# Patient Record
Sex: Male | Born: 1950
Health system: Southern US, Community
[De-identification: ages and names within clinical notes are randomized; demographics above are authoritative.]

## PROBLEM LIST (undated history)

## (undated) DIAGNOSIS — Z9889 Other specified postprocedural states: Secondary | ICD-10-CM

## (undated) DIAGNOSIS — B019 Varicella without complication: Secondary | ICD-10-CM

## (undated) DIAGNOSIS — B059 Measles without complication: Secondary | ICD-10-CM

## (undated) DIAGNOSIS — B269 Mumps without complication: Secondary | ICD-10-CM

## (undated) DIAGNOSIS — Z8489 Family history of other specified conditions: Secondary | ICD-10-CM

## (undated) DIAGNOSIS — F209 Schizophrenia, unspecified: Secondary | ICD-10-CM

## (undated) DIAGNOSIS — R42 Dizziness and giddiness: Secondary | ICD-10-CM

## (undated) DIAGNOSIS — R112 Nausea with vomiting, unspecified: Secondary | ICD-10-CM

## (undated) HISTORY — DX: Measles without complication: B05.9

## (undated) HISTORY — PX: COLON SURGERY: SHX602

## (undated) HISTORY — PX: OTHER SURGICAL HISTORY: SHX169

## (undated) HISTORY — DX: Mumps without complication: B26.9

## (undated) HISTORY — DX: Varicella without complication: B01.9

## (undated) HISTORY — PX: ABDOMINAL SURGERY: SHX537

## (undated) HISTORY — DX: Dizziness and giddiness: R42

---

## 2009-12-01 ENCOUNTER — Ambulatory Visit: Payer: Self-pay | Admitting: Unknown Physician Specialty

## 2009-12-01 DIAGNOSIS — Z860101 Personal history of adenomatous and serrated colon polyps: Secondary | ICD-10-CM | POA: Insufficient documentation

## 2009-12-01 LAB — HM COLONOSCOPY

## 2011-08-13 LAB — LIPID PANEL
Cholesterol: 211 mg/dL — AB (ref 0–200)
HDL: 35 mg/dL (ref 35–70)
LDL Cholesterol: 145 mg/dL
Triglycerides: 155 mg/dL (ref 40–160)

## 2011-08-13 LAB — BASIC METABOLIC PANEL WITH GFR
BUN: 22 mg/dL — AB (ref 4–21)
Creatinine: 1.4 mg/dL — AB (ref 0.6–1.3)
Glucose: 89 mg/dL
Potassium: 4.8 mmol/L (ref 3.4–5.3)
Sodium: 140 mmol/L (ref 137–147)

## 2011-08-13 LAB — TSH: TSH: 2.01 u[IU]/mL (ref 0.41–5.90)

## 2011-08-29 ENCOUNTER — Emergency Department: Payer: Self-pay | Admitting: *Deleted

## 2012-02-21 LAB — LIPID PANEL
Cholesterol: 171 mg/dL (ref 0–200)
HDL: 36 mg/dL (ref 35–70)
LDL Cholesterol: 108 mg/dL
Triglycerides: 133 mg/dL (ref 40–160)

## 2012-02-21 LAB — BASIC METABOLIC PANEL WITH GFR
BUN: 21 mg/dL (ref 4–21)
Creatinine: 1.5 mg/dL — AB (ref 0.6–1.3)
Glucose: 93 mg/dL
Potassium: 4.7 mmol/L (ref 3.4–5.3)
Sodium: 139 mmol/L (ref 137–147)

## 2014-01-23 DIAGNOSIS — F2089 Other schizophrenia: Secondary | ICD-10-CM | POA: Diagnosis not present

## 2014-03-20 DIAGNOSIS — F209 Schizophrenia, unspecified: Secondary | ICD-10-CM | POA: Diagnosis not present

## 2014-06-18 DIAGNOSIS — F209 Schizophrenia, unspecified: Secondary | ICD-10-CM | POA: Diagnosis not present

## 2014-08-09 ENCOUNTER — Emergency Department: Payer: Self-pay | Admitting: Student

## 2014-08-09 DIAGNOSIS — S62639A Displaced fracture of distal phalanx of unspecified finger, initial encounter for closed fracture: Secondary | ICD-10-CM | POA: Diagnosis not present

## 2014-08-09 DIAGNOSIS — Z23 Encounter for immunization: Secondary | ICD-10-CM | POA: Diagnosis not present

## 2014-08-09 DIAGNOSIS — S62639B Displaced fracture of distal phalanx of unspecified finger, initial encounter for open fracture: Secondary | ICD-10-CM | POA: Diagnosis not present

## 2014-08-09 DIAGNOSIS — S61209A Unspecified open wound of unspecified finger without damage to nail, initial encounter: Secondary | ICD-10-CM | POA: Diagnosis not present

## 2014-08-09 DIAGNOSIS — S6980XA Other specified injuries of unspecified wrist, hand and finger(s), initial encounter: Secondary | ICD-10-CM | POA: Diagnosis not present

## 2014-08-09 DIAGNOSIS — I10 Essential (primary) hypertension: Secondary | ICD-10-CM | POA: Diagnosis not present

## 2014-08-09 DIAGNOSIS — F17200 Nicotine dependence, unspecified, uncomplicated: Secondary | ICD-10-CM | POA: Diagnosis not present

## 2014-08-09 DIAGNOSIS — S6990XA Unspecified injury of unspecified wrist, hand and finger(s), initial encounter: Secondary | ICD-10-CM | POA: Diagnosis not present

## 2014-08-09 DIAGNOSIS — S62609B Fracture of unspecified phalanx of unspecified finger, initial encounter for open fracture: Secondary | ICD-10-CM | POA: Diagnosis not present

## 2014-08-12 DIAGNOSIS — S62639B Displaced fracture of distal phalanx of unspecified finger, initial encounter for open fracture: Secondary | ICD-10-CM | POA: Diagnosis not present

## 2014-08-20 DIAGNOSIS — F209 Schizophrenia, unspecified: Secondary | ICD-10-CM | POA: Diagnosis not present

## 2014-09-03 DIAGNOSIS — M722 Plantar fascial fibromatosis: Secondary | ICD-10-CM | POA: Diagnosis not present

## 2014-09-03 DIAGNOSIS — M79609 Pain in unspecified limb: Secondary | ICD-10-CM | POA: Diagnosis not present

## 2014-09-03 DIAGNOSIS — M7730 Calcaneal spur, unspecified foot: Secondary | ICD-10-CM | POA: Diagnosis not present

## 2014-09-05 DIAGNOSIS — IMO0001 Reserved for inherently not codable concepts without codable children: Secondary | ICD-10-CM | POA: Diagnosis not present

## 2014-09-18 DIAGNOSIS — Z79899 Other long term (current) drug therapy: Secondary | ICD-10-CM | POA: Diagnosis not present

## 2014-09-18 DIAGNOSIS — F209 Schizophrenia, unspecified: Secondary | ICD-10-CM | POA: Diagnosis not present

## 2014-10-01 DIAGNOSIS — F200 Paranoid schizophrenia: Secondary | ICD-10-CM | POA: Diagnosis not present

## 2014-11-01 DIAGNOSIS — F200 Paranoid schizophrenia: Secondary | ICD-10-CM | POA: Diagnosis not present

## 2015-01-30 DIAGNOSIS — F200 Paranoid schizophrenia: Secondary | ICD-10-CM | POA: Diagnosis not present

## 2015-02-20 DIAGNOSIS — Z79899 Other long term (current) drug therapy: Secondary | ICD-10-CM | POA: Diagnosis not present

## 2015-04-24 DIAGNOSIS — F200 Paranoid schizophrenia: Secondary | ICD-10-CM | POA: Diagnosis not present

## 2015-05-14 ENCOUNTER — Telehealth: Payer: Self-pay | Admitting: Family Medicine

## 2015-05-14 NOTE — Telephone Encounter (Signed)
Scheduled Reestablish appt for Friday 05/16/15. Thanks tnp

## 2015-05-14 NOTE — Telephone Encounter (Addendum)
Pt sister, Osborne Casco called stating pt is feeling dizzy and falling.  Pt has not been seen since 02/17/2012.  Pt has been seeing Dr Jimmye Norman his psychiatrist for the past 3 years.  Rx-Olanzapine 15mg , Paroxetine 10mg , Propranolol 10mg  and 20mg , Risperidone 4mg  and Clonazepam 1mg .  Will you reestablish this pt? [phone]/[id number]

## 2015-05-14 NOTE — Telephone Encounter (Signed)
OK to re-establish 

## 2015-05-15 ENCOUNTER — Encounter: Payer: Self-pay | Admitting: *Deleted

## 2015-05-15 ENCOUNTER — Telehealth: Payer: Self-pay | Admitting: Family Medicine

## 2015-05-15 DIAGNOSIS — R609 Edema, unspecified: Secondary | ICD-10-CM | POA: Insufficient documentation

## 2015-05-15 DIAGNOSIS — I872 Venous insufficiency (chronic) (peripheral): Secondary | ICD-10-CM | POA: Insufficient documentation

## 2015-05-16 ENCOUNTER — Ambulatory Visit (INDEPENDENT_AMBULATORY_CARE_PROVIDER_SITE_OTHER): Payer: Medicare Other | Admitting: Family Medicine

## 2015-05-16 ENCOUNTER — Encounter: Payer: Self-pay | Admitting: Family Medicine

## 2015-05-16 ENCOUNTER — Other Ambulatory Visit: Payer: Self-pay | Admitting: Psychiatry

## 2015-05-16 VITALS — BP 90/60 | HR 72 | Temp 97.5°F | Resp 18 | Ht 71.0 in | Wt 241.0 lb

## 2015-05-16 DIAGNOSIS — F419 Anxiety disorder, unspecified: Secondary | ICD-10-CM

## 2015-05-16 DIAGNOSIS — R42 Dizziness and giddiness: Secondary | ICD-10-CM

## 2015-05-16 DIAGNOSIS — Z23 Encounter for immunization: Secondary | ICD-10-CM | POA: Diagnosis not present

## 2015-05-16 DIAGNOSIS — Z1389 Encounter for screening for other disorder: Secondary | ICD-10-CM

## 2015-05-16 DIAGNOSIS — H6123 Impacted cerumen, bilateral: Secondary | ICD-10-CM

## 2015-05-16 DIAGNOSIS — F209 Schizophrenia, unspecified: Secondary | ICD-10-CM

## 2015-05-16 NOTE — Progress Notes (Signed)
   Patient: [name] Male    DOB: Sep 30, 1951   64 y.o.   MRN: [id number] Visit Date: 05/16/2015  Today's Provider: Lelon Huh, MD   Chief Complaint  Patient presents with  . Establish Care   Subjective:    Dizziness This is a new problem. The current episode started 1 to 4 weeks ago (x3 weeks). The problem occurs daily. The problem has been unchanged. Associated symptoms include fatigue, nausea and a visual change. Pertinent negatives include no abdominal pain, chest pain, coughing, headaches, joint swelling, neck pain or vomiting. The symptoms are aggravated by bending. He has tried nothing for the symptoms. The treatment provided no relief.     Previous Medications   OLANZAPINE (ZYPREXA) 15 MG TABLET    Take 15 mg by mouth at bedtime.   PAROXETINE (PAXIL) 30 MG TABLET    Take 30 mg by mouth daily.   PROPRANOLOL (INDERAL) 10 MG TABLET    Take 10 mg by mouth 3 (three) times daily.    Review of Systems  Constitutional: Positive for fatigue.  HENT: Positive for hearing loss. Negative for ear pain.   Eyes: Positive for visual disturbance. Negative for pain.  Respiratory: Positive for shortness of breath. Negative for cough, chest tightness and wheezing.   Cardiovascular: Negative for chest pain, palpitations and leg swelling.  Gastrointestinal: Positive for nausea and constipation. Negative for vomiting, abdominal pain and diarrhea.  Genitourinary: Negative for urgency and frequency.  Musculoskeletal: Negative for back pain, joint swelling and neck pain.  Neurological: Positive for dizziness and light-headedness. Negative for headaches.     History  Substance Use Topics  . Smoking status: Former Research scientist (life sciences)  . Smokeless tobacco: Not on file  . Alcohol Use: 0.0 oz/week    0 Standard drinks or equivalent per week   Objective:    Filed Vitals:   05/16/15 0923  BP: 90/60  Pulse: 72  Temp: 97.5 F (36.4 C)  TempSrc: Oral  Resp: 18  Height: 5\' 11"  (1.803 m)  Weight: 241  lb (109.317 kg)    Physical Exam  Constitutional: He is oriented to person, place, and time. He appears well-developed and well-nourished.  Obese  HENT:  Head: Normocephalic.  Cerumen impaction both ears  Cardiovascular: Normal rate.   Neurological: He is alert and oriented to person, place, and time. Coordination normal.        Assessment & Plan:     1. Dizziness Seems to be due to propranolol as he states this has nearly resolved since his psychiatrist stopped medication. Call if sx return. Will send for copy of most recently labs his psychiatrist.  - EKG 12-Lead  2. Acute anxiety Stable. Continue routine psych follow up.  - EKG 12-Lead  3. Need for pneumococcal vaccination  - Pneumococcal conjugate vaccine 13-valent IM  4. Cerumen impaction Ceruminosis is noted.  Wax is removed by syringing and manual debridement. Instructions for home care to prevent wax buildup are given.

## 2015-05-23 NOTE — Telephone Encounter (Signed)
Pt was called and notified that rx was sent.

## 2015-06-13 ENCOUNTER — Other Ambulatory Visit: Payer: Self-pay | Admitting: Psychiatry

## 2015-06-13 MED ORDER — PROPRANOLOL HCL 10 MG PO TABS: 10.0000 mg | ORAL_TABLET | Freq: Two times a day (BID) | ORAL | Status: DC

## 2015-06-13 NOTE — Telephone Encounter (Signed)
C request from the pharmacy for Inderal 20 mg twice a day. However poor plan at the last appointment on 04/24/2015 the patient was to decrease to 10 mg twice a day. I have ordered this  Dose.

## 2015-08-05 ENCOUNTER — Encounter: Payer: Self-pay | Admitting: Psychiatry

## 2015-08-05 ENCOUNTER — Ambulatory Visit (INDEPENDENT_AMBULATORY_CARE_PROVIDER_SITE_OTHER): Payer: Medicare Other | Admitting: Psychiatry

## 2015-08-05 VITALS — BP 110/76 | HR 65 | Temp 97.1°F | Ht 72.0 in | Wt 241.2 lb

## 2015-08-05 DIAGNOSIS — F200 Paranoid schizophrenia: Secondary | ICD-10-CM

## 2015-08-05 MED ORDER — RISPERIDONE 4 MG PO TABS: 4.0000 mg | ORAL_TABLET | Freq: Every day | ORAL | Status: DC

## 2015-08-05 MED ORDER — CLONAZEPAM 1 MG PO TABS: 1.0000 mg | ORAL_TABLET | Freq: Every day | ORAL | Status: DC

## 2015-08-05 MED ORDER — PROPRANOLOL HCL 10 MG PO TABS: 10.0000 mg | ORAL_TABLET | Freq: Every day | ORAL | Status: DC

## 2015-08-05 MED ORDER — PAROXETINE HCL 30 MG PO TABS: 15.0000 mg | ORAL_TABLET | Freq: Every day | ORAL | Status: DC

## 2015-08-05 MED ORDER — OLANZAPINE 15 MG PO TABS: 30.0000 mg | ORAL_TABLET | Freq: Every day | ORAL | Status: DC

## 2015-08-05 NOTE — Progress Notes (Signed)
BH MD/PA/NP OP Progress Note  08/05/2015 11:29 AM [name]  MRN:  [id number]  Subjective:  Patient resents follow-up of his schizophrenia. He presents with his sister who was presented to all of his appointments. They indicate he continues to do well his mood is been good. The big issues now are that his sister would like to get him active in a program where he participates in activities. Patient does walk his dog. When there were last here in May to discuss some dizziness that appears to occur mainly in the morning and then go away. I did discuss that the propranolol could be controlling factor as well as the fact that he takes all of his psychiatric medications at bedtime and might be having some orthostasis. However both patient and sister feel he is doing really well. The sister has changed his dose of propranolol to just 20 mg at night. She indicates that the dizziness got a little better with this change. We have decided we'll go down to 10 mg at bedtime. Chief Complaint:  I'm feeling like myself Visit Diagnosis:  No diagnosis found.  Past Medical History:  Past Medical History  Diagnosis Date  . Chicken pox   . Measles   . Mumps   . Dizziness     Past Surgical History  Procedure Laterality Date  . None     Family History:  Family History  Problem Relation Age of Onset  . Diabetes Mother   . Heart disease Mother   . Stroke Mother   . Diabetes Father   . Hypertension Father   . Lung cancer Father   . Cancer Father     lung cancer  . Schizophrenia Sister   . Lung cancer Sister   . Colon cancer Sister    Social History:  Social History   Social History  . Marital Status: Single    Spouse Name: N/A  . Number of Children: 2  . Years of Education: N/A   Occupational History  . Disabled    Social History Main Topics  . Smoking status: Former Smoker    Types: Cigarettes    Quit date: 08/04/2004  . Smokeless tobacco: Former Systems developer    Quit date: 08/04/2004  .  Alcohol Use: No  . Drug Use: No  . Sexual Activity: Not Currently   Other Topics Concern  . None   Social History Narrative   Additional History:   Assessment:   Musculoskeletal: Strength & Muscle Tone: within normal limits Gait & Station: normal Patient leans: N/A  Psychiatric Specialty Exam: HPI  Review of Systems  Psychiatric/Behavioral: Negative for depression, suicidal ideas, hallucinations, memory loss and substance abuse. The patient is not nervous/anxious and does not have insomnia.     Blood pressure 110/76, pulse 65, temperature 97.1 F (36.2 C), temperature source Tympanic, height 6' (1.829 m), weight 241 lb 3.2 oz (109.408 kg), SpO2 95 %.Body mass index is 32.71 kg/(m^2).  General Appearance: Neat and Well Groomed  Eye Contact:  Good  Speech:  Normal Rate  Volume:  Normal  Mood:  I'm feeling like myself  Affect:  Appropriate and Congruent  Thought Process:  Linear and at times concrete  Orientation:  Full (Time, Place, and Person)  Thought Content:  Negative  Suicidal Thoughts:  No  Homicidal Thoughts:  No  Memory:  Immediate;   Good Recent;   Good Remote;   Good  Judgement:  Good  Insight:  Good  Psychomotor Activity:  Negative  Concentration:  Good  Recall:  Good  Fund of Knowledge: Fair  Language: Fair  Akathisia:  Negative  Handed:  Right unknown   AIMS (if indicated):  Done today, normal  Assets:  Desire for Improvement Social Support  ADL's:  Intact  Cognition: WNL  Sleep:  good   Is the patient at risk to self?  No. Has the patient been a risk to self in the past 6 months?  No. Has the patient been a risk to self within the distant past?  No. Is the patient a risk to others?  No. Has the patient been a risk to others in the past 6 months?  No. Has the patient been a risk to others within the distant past?  No.  Current Medications: Current Outpatient Prescriptions  Medication Sig Dispense Refill  . clonazePAM (KLONOPIN) 1 MG tablet  Take 1 tablet (1 mg total) by mouth at bedtime. 30 tablet 3  . OLANZapine (ZYPREXA) 15 MG tablet Take 2 tablets (30 mg total) by mouth at bedtime. 60 tablet 3  . PARoxetine (PAXIL) 30 MG tablet Take 0.5 tablets (15 mg total) by mouth daily. 15 tablet 3  . propranolol (INDERAL) 20 MG tablet TAKE ONE TABLET IN THE EVENING AND ONE AT BEDTIME 60 tablet 0  . risperidone (RISPERDAL) 4 MG tablet 1 tablet daily.    . propranolol (INDERAL) 10 MG tablet Take 10 mg by mouth 3 (three) times daily.    . propranolol (INDERAL) 10 MG tablet Take 1 tablet (10 mg total) by mouth at bedtime. 30 tablet 3   No current facility-administered medications for this visit.    Medical Decision Making:  Established Problem, Stable/Improving (1), Review of Medication Regimen & Side Effects (2) and Review of New Medication or Change in Dosage (2)  Treatment Plan Summary:Medication management and Plan Spent some time discussing with them that the patient is on several medications at night. I did inform both patient and sister about the olanzapine dose being 30 mg however both patient and sister feel this current dosage has been helpful. Given weighing patient's standing stability and desire to continue on this dose we'll continue with it. This has been the case for my discussion with them on being on 2 antipsychotic medications and they raise similar desire to continue with the current regimen. This might be reasonable given his long-standing stability on this regimen. Thus we'll continue his clonazepam 1 mg at bedtime, his olanzapine 30 mg at bedtime his paroxetine 15 mg at bedtime and his risperidone 4 mg at bedtime. We will decrease his propranolol from the 20 Milligan's at bedtime to 10 mg at bedtime to try to address his complaints of some morning dizziness.   Faith Rogue 08/05/2015, 11:29 AM

## 2015-11-03 ENCOUNTER — Ambulatory Visit (INDEPENDENT_AMBULATORY_CARE_PROVIDER_SITE_OTHER): Payer: Medicare PPO | Admitting: Psychiatry

## 2015-11-03 ENCOUNTER — Encounter: Payer: Self-pay | Admitting: Psychiatry

## 2015-11-03 VITALS — BP 126/62 | HR 79 | Temp 97.4°F | Ht 72.0 in | Wt 247.0 lb

## 2015-11-03 DIAGNOSIS — F200 Paranoid schizophrenia: Secondary | ICD-10-CM | POA: Diagnosis not present

## 2015-11-03 MED ORDER — PAROXETINE HCL 30 MG PO TABS: 15.0000 mg | ORAL_TABLET | Freq: Every day | ORAL | Status: DC

## 2015-11-03 MED ORDER — CLONAZEPAM 1 MG PO TABS: 1.0000 mg | ORAL_TABLET | Freq: Every day | ORAL | Status: DC

## 2015-11-03 MED ORDER — RISPERIDONE 4 MG PO TABS: 4.0000 mg | ORAL_TABLET | Freq: Every day | ORAL | Status: DC

## 2015-11-03 MED ORDER — PROPRANOLOL HCL 10 MG PO TABS: 10.0000 mg | ORAL_TABLET | ORAL | Status: DC

## 2015-11-03 MED ORDER — OLANZAPINE 15 MG PO TABS: 30.0000 mg | ORAL_TABLET | Freq: Every day | ORAL | Status: DC

## 2015-11-03 NOTE — Progress Notes (Signed)
BH MD/PA/NP OP Progress Note  11/03/2015 10:59 AM [name]  MRN:  [id number]  Subjective:  Patient resents follow-up of his schizophrenia. He presents with his sister as he has for all those appointments. They indicate that there is been no psychiatric problems. Patient does state that he drools. Sr. comments that is because he told his mouth open. I did discuss with them as I have in the past that it could be ineffective him being on 2 antipsychotic medications. Both patient and sister want to stay on the same regimen as he has decompensated in the past when there have been changes to it.  In case he sleeping well and eating well. I did discuss with him again any issues with dizziness as we have been decreasing his propranolol. At the last visit we decreased his dose to 10 mg at bedtime. However the sister indicates she is continue to give him 10 mg twice a day. She indicates he still does have issues with dizziness and thus I recommended taking a 10 mg dose once a day. However we decided that perhaps it's better to take the dose in the morning because it any benefits for anxiety by taking it when he is sleeping. Chief Complaint: Drooling Chief Complaint    Follow-up; Medication Refill; Other     I'm feeling like myself Visit Diagnosis:     ICD-9-CM ICD-10-CM   1. Paranoid schizophrenia ([place]) 295.30 F20.0     Past Medical History:  Past Medical History  Diagnosis Date  . Chicken pox   . Measles   . Mumps   . Dizziness     Past Surgical History  Procedure Laterality Date  . None     Family History:  Family History  Problem Relation Age of Onset  . Diabetes Mother   . Heart disease Mother   . Stroke Mother   . Diabetes Father   . Hypertension Father   . Lung cancer Father   . Cancer Father     lung cancer  . Schizophrenia Sister   . Lung cancer Sister   . Colon cancer Sister    Social History:  Social History   Social History  . Marital Status: Single    Spouse  Name: N/A  . Number of Children: 2  . Years of Education: N/A   Occupational History  . Disabled    Social History Main Topics  . Smoking status: Former Smoker    Types: Cigarettes    Quit date: 08/04/2004  . Smokeless tobacco: Former Systems developer    Quit date: 08/04/2004  . Alcohol Use: No  . Drug Use: No  . Sexual Activity: Not Currently   Other Topics Concern  . None   Social History Narrative   Additional History:   Assessment:   Musculoskeletal: Strength & Muscle Tone: within normal limits Gait & Station: normal Patient leans: N/A  Psychiatric Specialty Exam: HPI  Review of Systems  Psychiatric/Behavioral: Negative for depression, suicidal ideas, hallucinations, memory loss and substance abuse. The patient is not nervous/anxious and does not have insomnia.   All other systems reviewed and are negative.   Blood pressure 126/62, pulse 79, temperature 97.4 F (36.3 C), temperature source Tympanic, height 6' (1.829 m), weight 247 lb (112.038 kg), SpO2 95 %.Body mass index is 33.49 kg/(m^2).  General Appearance: Neat and Well Groomed  Eye Contact:  Good  Speech:  Normal Rate  Volume:  Normal  Mood:  I'm feeling like myself  Affect:  Appropriate and Congruent  Thought Process:  Linear and at times concrete  Orientation:  Full (Time, Place, and Person)  Thought Content:  Negative  Suicidal Thoughts:  No  Homicidal Thoughts:  No  Memory:  Immediate;   Good Recent;   Good Remote;   Good  Judgement:  Good  Insight:  Good  Psychomotor Activity:  Negative  Concentration:  Good  Recall:  Good  Fund of Knowledge: Fair  Language: Fair  Akathisia:  Negative  Handed:  Right unknown   AIMS (if indicated):  Done 11/03/15 normal  Assets:  Desire for Improvement Social Support  ADL's:  Intact  Cognition: WNL  Sleep:  good   Is the patient at risk to self?  No. Has the patient been a risk to self in the past 6 months?  No. Has the patient been a risk to self within the  distant past?  No. Is the patient a risk to others?  No. Has the patient been a risk to others in the past 6 months?  No. Has the patient been a risk to others within the distant past?  No.  Current Medications: Current Outpatient Prescriptions  Medication Sig Dispense Refill  . clonazePAM (KLONOPIN) 1 MG tablet Take 1 tablet (1 mg total) by mouth at bedtime. 30 tablet 3  . OLANZapine (ZYPREXA) 15 MG tablet Take 2 tablets (30 mg total) by mouth at bedtime. 60 tablet 3  . PARoxetine (PAXIL) 30 MG tablet Take 0.5 tablets (15 mg total) by mouth daily. 15 tablet 3  . propranolol (INDERAL) 10 MG tablet Take 1 tablet (10 mg total) by mouth every morning. 30 tablet 3  . risperidone (RISPERDAL) 4 MG tablet Take 1 tablet (4 mg total) by mouth at bedtime. 30 tablet 3   No current facility-administered medications for this visit.    Medical Decision Making:  Established Problem, Stable/Improving (1), Review of Medication Regimen & Side Effects (2) and Review of New Medication or Change in Dosage (2)  Treatment Plan Summary:Medication management and Plan   Schizophrenia-stable. We will continue his olanzapine 30 mg daily, his risperidone 4 mg, at bedtime, paroxetine 15 mg daily, clonazepam 1 mg at bedtime and propranolol 10 mg in the morning.  Patient will follow up in 3 months. They've been encouraged to call with any questions or concerns prior to the next appointment.   Faith Rogue 11/03/2015, 10:59 AM

## 2016-02-03 ENCOUNTER — Encounter: Payer: Self-pay | Admitting: Psychiatry

## 2016-02-03 ENCOUNTER — Ambulatory Visit (INDEPENDENT_AMBULATORY_CARE_PROVIDER_SITE_OTHER): Payer: Medicare Other | Admitting: Psychiatry

## 2016-02-03 ENCOUNTER — Ambulatory Visit: Payer: Medicare Other | Admitting: Psychiatry

## 2016-02-03 DIAGNOSIS — F209 Schizophrenia, unspecified: Secondary | ICD-10-CM

## 2016-02-03 MED ORDER — PAROXETINE HCL 30 MG PO TABS: 15.0000 mg | ORAL_TABLET | Freq: Every day | ORAL | Status: DC

## 2016-02-03 MED ORDER — OLANZAPINE 15 MG PO TABS: 30.0000 mg | ORAL_TABLET | Freq: Every day | ORAL | Status: DC

## 2016-02-03 MED ORDER — RISPERIDONE 4 MG PO TABS: 4.0000 mg | ORAL_TABLET | Freq: Every day | ORAL | Status: DC

## 2016-02-03 MED ORDER — CLONAZEPAM 1 MG PO TABS: 1.0000 mg | ORAL_TABLET | Freq: Every day | ORAL | Status: DC

## 2016-02-03 NOTE — Progress Notes (Signed)
Patient ID: [name], male   DOB: Nov 18, 1951, 65 y.o.   MRN: [id number] [facility] MD/PA/NP OP Progress Note  02/03/2016 10:33 AM [name]  MRN:  [id number]  Subjective:  Patient is a 65 year old white male with a history of schizophrenia. He was previously seen by Dr. Jimmye Norman. Patient is presents today with his sister for a follow-up appointment. Both patient and his sister report that he has been doing quite well and denies any symptoms. Sister reports that they have stopped the propranolol. Discussed with them the need for 2 antipsychotics such as Zyprexa and Risperdal. His sister reports that the previously been to had tried to tweak his medications it had made the patient violent and caused him to be hospitalized. She states that they're not willing to change any medication at this time. States that this current combination keeps him of functioning well. He stays home mostly but he is able to do chores and enjoys life. He sleeps quite well. Denies hearing voices or seeing things. Denies any suicidal ideations.  Per Dr.williams note,"  I did discuss with them as I have in the past that it could be ineffective him being on 2 antipsychotic medications. Both patient and sister want to stay on the same regimen as he has decompensated in the past when there have been changes to it."   Chief Complaint:   Doing well Visit Diagnosis:   No diagnosis found.  Past Medical History:  Past Medical History  Diagnosis Date  . Chicken pox   . Measles   . Mumps   . Dizziness     Past Surgical History  Procedure Laterality Date  . None     Family History:  Family History  Problem Relation Age of Onset  . Diabetes Mother   . Heart disease Mother   . Stroke Mother   . Diabetes Father   . Hypertension Father   . Lung cancer Father   . Cancer Father     lung cancer  . Schizophrenia Sister   . Lung cancer Sister   . Colon cancer Sister    Social History:  Social History   Social  History  . Marital Status: Single    Spouse Name: N/A  . Number of Children: 2  . Years of Education: N/A   Occupational History  . Disabled    Social History Main Topics  . Smoking status: Former Smoker    Types: Cigarettes    Quit date: 08/04/2004  . Smokeless tobacco: Former Systems developer    Quit date: 08/04/2004  . Alcohol Use: No  . Drug Use: No  . Sexual Activity: Not Currently   Other Topics Concern  . Not on file   Social History Narrative   Additional History:   Assessment:   Musculoskeletal: Strength & Muscle Tone: within normal limits Gait & Station: normal Patient leans: N/A  Psychiatric Specialty Exam: HPI  Review of Systems  Psychiatric/Behavioral: Negative for depression, suicidal ideas, hallucinations, memory loss and substance abuse. The patient is not nervous/anxious and does not have insomnia.   All other systems reviewed and are negative.   There were no vitals taken for this visit.There is no weight on file to calculate BMI.  General Appearance: Neat and Well Groomed  Eye Contact:  Good  Speech:  Normal Rate  Volume:  Normal  Mood:  I'm feeling like myself  Affect:  Appropriate and Congruent  Thought Process:  Linear and at times concrete  Orientation:  Full (  Time, Place, and Person)  Thought Content:  Negative  Suicidal Thoughts:  No  Homicidal Thoughts:  No  Memory:  Immediate;   Good Recent;   Good Remote;   Good  Judgement:  Good  Insight:  Good  Psychomotor Activity:  Negative  Concentration:  Good  Recall:  Good  Fund of Knowledge: Fair  Language: Fair  Akathisia:  Negative  Handed:  Right unknown   AIMS (if indicated):  On 02/03/2016, normal, no rigidity or cog wheeling  Assets:  Desire for Improvement Social Support  ADL's:  Intact  Cognition: WNL  Sleep:  good   Is the patient at risk to self?  No. Has the patient been a risk to self in the past 6 months?  No. Has the patient been a risk to self within the distant past?   No. Is the patient a risk to others?  No. Has the patient been a risk to others in the past 6 months?  No. Has the patient been a risk to others within the distant past?  No.  Current Medications: Current Outpatient Prescriptions  Medication Sig Dispense Refill  . clonazePAM (KLONOPIN) 1 MG tablet Take 1 tablet (1 mg total) by mouth at bedtime. 30 tablet 3  . OLANZapine (ZYPREXA) 15 MG tablet Take 2 tablets (30 mg total) by mouth at bedtime. 60 tablet 3  . PARoxetine (PAXIL) 30 MG tablet Take 0.5 tablets (15 mg total) by mouth daily. 15 tablet 3  . propranolol (INDERAL) 10 MG tablet Take 1 tablet (10 mg total) by mouth every morning. 30 tablet 3  . risperidone (RISPERDAL) 4 MG tablet Take 1 tablet (4 mg total) by mouth at bedtime. 30 tablet 3   No current facility-administered medications for this visit.    Medical Decision Making:  Established Problem, Stable/Improving (1), Review of Medication Regimen & Side Effects (2) and Review of New Medication or Change in Dosage (2)  Treatment Plan Summary:Medication management and Plan   Schizophrenia-stable. We will continue his olanzapine 30 mg daily, his risperidone 4 mg, at bedtime, paroxetine 15 mg daily, clonazepam 1 mg at bedtime. Patient and his sister were educated about the incidence of side effects and long-term movement disorders being high with combination of Zyprexa and Risperdal. Also discussed metabolic disturbances with the combination of these medications. However they stated that patient has needed this combination to be able to function.  Patient will follow up in 3 months. They've been encouraged to call with any questions or concerns prior to the next appointment.   Tannie Koskela 02/03/2016, 10:33 AM

## 2016-03-18 ENCOUNTER — Emergency Department: Payer: Medicare PPO

## 2016-03-18 ENCOUNTER — Inpatient Hospital Stay
Admission: EM | Admit: 2016-03-18 | Discharge: 2016-03-26 | DRG: 336 | Disposition: A | Discharge status: Home / Self Care | Payer: Medicare PPO | Attending: Surgery | Admitting: Surgery

## 2016-03-18 ENCOUNTER — Encounter: Payer: Self-pay | Admitting: Radiology

## 2016-03-18 DIAGNOSIS — Z833 Family history of diabetes mellitus: Secondary | ICD-10-CM

## 2016-03-18 DIAGNOSIS — Z823 Family history of stroke: Secondary | ICD-10-CM | POA: Diagnosis not present

## 2016-03-18 DIAGNOSIS — F2089 Other schizophrenia: Secondary | ICD-10-CM | POA: Diagnosis present

## 2016-03-18 DIAGNOSIS — Z8249 Family history of ischemic heart disease and other diseases of the circulatory system: Secondary | ICD-10-CM

## 2016-03-18 DIAGNOSIS — Z4659 Encounter for fitting and adjustment of other gastrointestinal appliance and device: Secondary | ICD-10-CM

## 2016-03-18 DIAGNOSIS — W1830XA Fall on same level, unspecified, initial encounter: Secondary | ICD-10-CM | POA: Diagnosis present

## 2016-03-18 DIAGNOSIS — K565 Intestinal adhesions [bands] with obstruction (postprocedural) (postinfection): Principal | ICD-10-CM | POA: Diagnosis present

## 2016-03-18 DIAGNOSIS — Z801 Family history of malignant neoplasm of trachea, bronchus and lung: Secondary | ICD-10-CM

## 2016-03-18 DIAGNOSIS — K9189 Other postprocedural complications and disorders of digestive system: Secondary | ICD-10-CM | POA: Diagnosis present

## 2016-03-18 DIAGNOSIS — S51011A Laceration without foreign body of right elbow, initial encounter: Secondary | ICD-10-CM | POA: Diagnosis present

## 2016-03-18 DIAGNOSIS — Z87891 Personal history of nicotine dependence: Secondary | ICD-10-CM | POA: Diagnosis not present

## 2016-03-18 DIAGNOSIS — K56609 Unspecified intestinal obstruction, unspecified as to partial versus complete obstruction: Secondary | ICD-10-CM | POA: Diagnosis present

## 2016-03-18 DIAGNOSIS — Y92002 Bathroom of unspecified non-institutional (private) residence single-family (private) house as the place of occurrence of the external cause: Secondary | ICD-10-CM | POA: Diagnosis not present

## 2016-03-18 DIAGNOSIS — K5669 Other intestinal obstruction: Secondary | ICD-10-CM

## 2016-03-18 DIAGNOSIS — Z800 Family history of malignant neoplasm of digestive organs: Secondary | ICD-10-CM | POA: Diagnosis not present

## 2016-03-18 DIAGNOSIS — Z8379 Family history of other diseases of the digestive system: Secondary | ICD-10-CM | POA: Diagnosis not present

## 2016-03-18 DIAGNOSIS — N189 Chronic kidney disease, unspecified: Secondary | ICD-10-CM | POA: Diagnosis present

## 2016-03-18 DIAGNOSIS — Z79899 Other long term (current) drug therapy: Secondary | ICD-10-CM

## 2016-03-18 DIAGNOSIS — R42 Dizziness and giddiness: Secondary | ICD-10-CM | POA: Diagnosis present

## 2016-03-18 DIAGNOSIS — E785 Hyperlipidemia, unspecified: Secondary | ICD-10-CM | POA: Diagnosis present

## 2016-03-18 DIAGNOSIS — Z818 Family history of other mental and behavioral disorders: Secondary | ICD-10-CM | POA: Diagnosis not present

## 2016-03-18 LAB — COMPREHENSIVE METABOLIC PANEL
ALT: 28 U/L (ref 17–63)
ANION GAP: 9 (ref 5–15)
AST: 31 U/L (ref 15–41)
Albumin: 3.6 g/dL (ref 3.5–5.0)
Alkaline Phosphatase: 72 U/L (ref 38–126)
BUN: 28 mg/dL — ABNORMAL HIGH (ref 6–20)
CALCIUM: 8.7 mg/dL — AB (ref 8.9–10.3)
CHLORIDE: 103 mmol/L (ref 101–111)
CO2: 23 mmol/L (ref 22–32)
Creatinine, Ser: 1.75 mg/dL — ABNORMAL HIGH (ref 0.61–1.24)
GFR calc non Af Amer: 39 mL/min — ABNORMAL LOW (ref >60)
GFR, EST AFRICAN AMERICAN: 45 mL/min — AB (ref >60)
Glucose, Bld: 154 mg/dL — ABNORMAL HIGH (ref 65–99)
POTASSIUM: 3.9 mmol/L (ref 3.5–5.1)
SODIUM: 135 mmol/L (ref 135–145)
Total Bilirubin: 0.7 mg/dL (ref 0.3–1.2)
Total Protein: 7 g/dL (ref 6.5–8.1)

## 2016-03-18 LAB — PROTIME-INR
INR: 1.12
PROTHROMBIN TIME: 14.6 s (ref 11.4–15.0)

## 2016-03-18 LAB — URINALYSIS COMPLETE WITH MICROSCOPIC (ARMC ONLY)
BACTERIA UA: NONE SEEN
Bilirubin Urine: NEGATIVE
Glucose, UA: NEGATIVE mg/dL
LEUKOCYTES UA: NEGATIVE
Nitrite: NEGATIVE
PROTEIN: 100 mg/dL — AB
SPECIFIC GRAVITY, URINE: 1.031 — AB (ref 1.005–1.030)
pH: 5 (ref 5.0–8.0)

## 2016-03-18 LAB — CBC
HEMATOCRIT: 40.4 % (ref 40.0–52.0)
Hemoglobin: 13.7 g/dL (ref 13.0–18.0)
MCH: 29.2 pg (ref 26.0–34.0)
MCHC: 34 g/dL (ref 32.0–36.0)
MCV: 86 fL (ref 80.0–100.0)
Platelets: 189 10*3/uL (ref 150–440)
RBC: 4.69 MIL/uL (ref 4.40–5.90)
RDW: 13.5 % (ref 11.5–14.5)
WBC: 7.3 10*3/uL (ref 3.8–10.6)

## 2016-03-18 LAB — TROPONIN I: Troponin I: <0.03 ng/mL (ref <0.031)

## 2016-03-18 LAB — APTT: APTT: 33 s (ref 24–36)

## 2016-03-18 MED ORDER — ONDANSETRON HCL 4 MG/2ML IJ SOLN
4.0000 mg | Freq: Four times a day (QID) | INTRAMUSCULAR | Status: DC | PRN
  Administered 2016-03-18 – 2016-03-23 (×2): 4 mg via INTRAVENOUS
  Filled 2016-03-18 (×2): qty 2

## 2016-03-18 MED ORDER — FLEET ENEMA 7-19 GM/118ML RE ENEM
1.0000 | ENEMA | Freq: Once | RECTAL | Status: AC
  Administered 2016-03-18: 1 via RECTAL

## 2016-03-18 MED ORDER — ONDANSETRON HCL 4 MG/2ML IJ SOLN
4.0000 mg | Freq: Once | INTRAMUSCULAR | Status: AC
  Administered 2016-03-18: 4 mg via INTRAVENOUS
  Filled 2016-03-18: qty 2

## 2016-03-18 MED ORDER — HALOPERIDOL LACTATE 5 MG/ML IJ SOLN
5.0000 mg | Freq: Four times a day (QID) | INTRAMUSCULAR | Status: DC | PRN
  Filled 2016-03-18 (×2): qty 1

## 2016-03-18 MED ORDER — PANTOPRAZOLE SODIUM 40 MG IV SOLR
40.0000 mg | Freq: Every day | INTRAVENOUS | Status: DC
  Administered 2016-03-18 – 2016-03-23 (×6): 40 mg via INTRAVENOUS
  Filled 2016-03-18 (×6): qty 40

## 2016-03-18 MED ORDER — DIATRIZOATE MEGLUMINE & SODIUM 66-10 % PO SOLN
15.0000 mL | Freq: Once | ORAL | Status: AC
  Administered 2016-03-18: 15 mL via ORAL

## 2016-03-18 MED ORDER — PHENOL 1.4 % MT LIQD
1.0000 | OROMUCOSAL | Status: DC | PRN
  Filled 2016-03-18 (×2): qty 177

## 2016-03-18 MED ORDER — IOPAMIDOL (ISOVUE-300) INJECTION 61%
75.0000 mL | Freq: Once | INTRAVENOUS | Status: AC | PRN
  Administered 2016-03-18: 75 mL via INTRAVENOUS

## 2016-03-18 MED ORDER — ONDANSETRON 8 MG PO TBDP
4.0000 mg | ORAL_TABLET | Freq: Four times a day (QID) | ORAL | Status: DC | PRN
  Filled 2016-03-18: qty 1

## 2016-03-18 MED ORDER — HEPARIN SODIUM (PORCINE) 5000 UNIT/ML IJ SOLN
5000.0000 [IU] | Freq: Three times a day (TID) | INTRAMUSCULAR | Status: DC
  Administered 2016-03-18 – 2016-03-20 (×5): 5000 [IU] via SUBCUTANEOUS
  Filled 2016-03-18 (×5): qty 1

## 2016-03-18 MED ORDER — SODIUM CHLORIDE 0.9 % IV BOLUS (SEPSIS)
1000.0000 mL | Freq: Once | INTRAVENOUS | Status: AC
  Administered 2016-03-18: 1000 mL via INTRAVENOUS

## 2016-03-18 MED ORDER — DEXTROSE IN LACTATED RINGERS 5 % IV SOLN
INTRAVENOUS | Status: DC
  Administered 2016-03-18 – 2016-03-25 (×16): via INTRAVENOUS

## 2016-03-18 MED ORDER — LORAZEPAM 2 MG/ML IJ SOLN
2.0000 mg | Freq: Four times a day (QID) | INTRAMUSCULAR | Status: DC | PRN
  Administered 2016-03-18 – 2016-03-24 (×3): 2 mg via INTRAVENOUS
  Filled 2016-03-18 (×3): qty 1

## 2016-03-18 MED ORDER — MORPHINE SULFATE (PF) 2 MG/ML IV SOLN
2.0000 mg | INTRAVENOUS | Status: DC | PRN
  Administered 2016-03-19 – 2016-03-20 (×2): 2 mg via INTRAVENOUS
  Filled 2016-03-18 (×2): qty 1

## 2016-03-18 NOTE — Progress Notes (Signed)
Normal saline bolus ordered. If patient does not void within few hours after bolus, per Dr. Dahlia Byes, insert foley.  Almedia Balls, RN

## 2016-03-18 NOTE — Progress Notes (Signed)
Patient's family concerned about patient not voiding since this morning despite 2 liters fluids infused. 0 ml on bladder scan. MD notified and 1 liter normal saline bolus ordered. One time fleet enema ordered and cardiac monitoring discontinued per Dr. Dahlia Byes.   Almedia Balls, RN

## 2016-03-18 NOTE — H&P (Signed)
Patient ID: [name], male   DOB: [birth year], 65 y.o.   MRN: [id number]  History of Present Illness [name] is a 65 y.o. male with Abdominal distension and discomfort. He specifically denies any abdominal pain. This started today and also he reports constipation with no bowel movements for about 2 days. He did have asked to see the nausea and vomiting with several emesis. He reports some mild discomfort and distention of his abdomen but no evidence of abdominal pain. Of note he denies any previous abdominal operations. His past medical history significant for schizophrenia he lives with h sister who is the power of attorney. One of sisters was diagnosed at age 60 with colon cancer. He did have a colonoscopy 5 years ago and apparently was clean and he is due for one now. Denies any flatus. Family history of Crohn's disease or personal history of inflammatory bowel disease. The scan personally reviewed there is evidence of dilated loops of small bowel and also large stomach. There is no evidence of pneumatosis or internal hernia. There is no free air there is some decompressed distal terminal ileum and there is no evidence of any masses.  Past Medical History Past Medical History  Diagnosis Date  . Chicken pox   . Measles   . Mumps   . Dizziness     Past Surgical History  Procedure Laterality Date  . None      No Known Allergies  Current Facility-Administered Medications  Medication Dose Route Frequency Provider Last Rate Last Dose  . dextrose 5 % in lactated ringers infusion   Intravenous Continuous Jules Husbands, MD 150 mL/hr at 03/18/16 1400    . haloperidol lactate (HALDOL) injection 5 mg  5 mg Intravenous Q6H PRN Diego F Pabon, MD      . heparin injection 5,000 Units  5,000 Units Subcutaneous 3 times per day Jules Husbands, MD   5,000 Units at 03/18/16 1400  . LORazepam (ATIVAN) injection 2 mg  2 mg Intravenous Q6H PRN Diego F Pabon, MD      . morphine 2 MG/ML injection 2  mg  2 mg Intravenous Q2H PRN Diego F Pabon, MD      . ondansetron (ZOFRAN-ODT) disintegrating tablet 4 mg  4 mg Oral Q6H PRN Diego F Pabon, MD       Or  . ondansetron (ZOFRAN) injection 4 mg  4 mg Intravenous Q6H PRN Diego F Pabon, MD      . pantoprazole (PROTONIX) injection 40 mg  40 mg Intravenous QHS Jules Husbands, MD        Family History Family History  Problem Relation Age of Onset  . Diabetes Mother   . Heart disease Mother   . Stroke Mother   . Diabetes Father   . Hypertension Father   . Lung cancer Father   . Cancer Father     lung cancer  . Schizophrenia Sister   . Lung cancer Sister   . Colon cancer Sister      Social History Social History  Substance Use Topics  . Smoking status: Former Smoker    Types: Cigarettes    Quit date: 08/04/2004  . Smokeless tobacco: Former Systems developer    Quit date: 08/04/2004  . Alcohol Use: No    ROS [name] was performance otherwise negative unless stated in the history of present illness   Physical Exam Blood pressure 120/74, pulse 91, temperature 98.7 F (37.1 C), temperature source Oral, resp. rate  17, height 6' (1.829 m), weight 104.327 kg (230 lb), SpO2 93 %.  CONSTITUTIONAL: NAD awake alert, obese EYES: Pupils equal, round, and reactive to light, Sclera non-icteric. EARS, NOSE, MOUTH AND THROAT: The oropharynx is clear. Oral mucosa is pink and moist. Hearing is intact to voice.  NECK: Trachea is midline, and there is no jugular venous distension. Thyroid is without palpable abnormalities. LYMPH NODES:  Lymph nodes in the neck are not enlarged. RESPIRATORY:  Lungs are clear, and breath sounds are equal bilaterally. Normal respiratory effort without pathologic use of accessory muscles. CARDIOVASCULAR: Heart is regular without murmurs, gallops, or rubs. GI: The abdomen is  soft, nontender, distended. There were no palpable masses. There was no hepatosplenomegaly. There were normal bowel sounds. No peritonitis MUSCULOSKELETAL:   Normal muscle strength and tone in all four extremities.    SKIN: Skin turgor is normal. There are no pathologic skin lesions.  NEUROLOGIC:  Motor and sensation is grossly normal.  Cranial nerves are grossly intact. PSYCH:  Alert and oriented to person, place and time. Affect is normal.  Data Reviewed   I have personally reviewed the patient's imaging and medical records.    Assessment/ Plan I'll bowel obstruction with no known etiology in a patient with a virgin abdomen. Differential includes her only malignancy versus inflammatory bowel disease. Now we will start with hydration, NG tube. He does have an acute kidney injury and we need to address his first before any surgical intervention is contemplated. Given the History and his multiple tachetic medications will ask psychiatry to see him for the management of parenteral antipsychotics. For now we will put him on when necessary Haldol and Ativan only. Cousin detail with patient and family and also about the fact that he may need an operation if his symptoms do not improve. He is actually a Jehovah's Witness and refuses any blood products. He states that and even if he were to die he'll refuses any blood products. I explained to him that I will order his wishes. Extensive counseling provided and we'll continue to follow.    Diego pabon, MD [place] 03/18/2016, 6:46 PM

## 2016-03-18 NOTE — ED Provider Notes (Signed)
[facility] Emergency Department Provider Note  ____________________________________________  Time seen: Approximately 7:12 AM  I have reviewed the triage vital signs and the nursing notes.   HISTORY  Chief Complaint Dizziness    HPI [name] is a 65 y.o. male history of his of schizophrenia, frequent constipation, chronic kidney disease.  Patient and his sister report that for the past 3 or 4 days he's been having some slight congestion, feeling a little bloated in the stomach and "constipated". He is not having any pain. No cough, he felt a little warm yesterday but did not check his temperature.  When standing to use the bathroom last night he felt a little lightheaded, and then fell on the side tearing the skin on his right elbow. He denies any head injury. No neck pain.  Currently reports that he feels okay just not very hungry. He usually would've had a bowl of cereal by now, but he just didn't feel like eating much this morning.  States he is urinating normally. No headache. No skin rashes.   Past Medical History  Diagnosis Date  . Chicken pox   . Measles   . Mumps   . Dizziness     Patient Active Problem List   Diagnosis Date Noted  . SBO (small bowel obstruction) ([place]) 03/18/2016  . Dizziness 05/16/2015  . Edema 05/15/2015  . Venous stasis dermatitis 05/15/2015  . Schizophrenia, simple, chronic ([place]) 08/21/2009  . Kidney Disease, Chronic stage II (mild) 08/20/2009  . Hyperlipemia 08/19/2009    Past Surgical History  Procedure Laterality Date  . None      Current Outpatient Rx  Name  Route  Sig  Dispense  Refill  . clonazePAM (KLONOPIN) 1 MG tablet   Oral   Take 1 tablet (1 mg total) by mouth at bedtime.   30 tablet   3   . OLANZapine (ZYPREXA) 15 MG tablet   Oral   Take 2 tablets (30 mg total) by mouth at bedtime.   60 tablet   3   . PARoxetine (PAXIL) 30 MG tablet   Oral   Take 0.5 tablets (15 mg total) by  mouth daily.   15 tablet   3   . risperidone (RISPERDAL) 4 MG tablet   Oral   Take 1 tablet (4 mg total) by mouth at bedtime.   30 tablet   3   . propranolol (INDERAL) 10 MG tablet   Oral   Take 1 tablet (10 mg total) by mouth every morning. Patient not taking: Reported on 02/03/2016   30 tablet   3     Allergies Review of patient's allergies indicates no known allergies.  Family History  Problem Relation Age of Onset  . Diabetes Mother   . Heart disease Mother   . Stroke Mother   . Diabetes Father   . Hypertension Father   . Lung cancer Father   . Cancer Father     lung cancer  . Schizophrenia Sister   . Lung cancer Sister   . Colon cancer Sister     Social History Social History  Substance Use Topics  . Smoking status: Former Smoker    Types: Cigarettes    Quit date: 08/04/2004  . Smokeless tobacco: Former Systems developer    Quit date: 08/04/2004  . Alcohol Use: No    Review of Systems Constitutional: Felt feverish yesterday, no chills Eyes: No visual changes. ENT: No sore throat. Just a slight runny nose last  3 days. Cardiovascular: Denies chest pain. Respiratory: Denies shortness of breath. Gastrointestinal: No abdominal pain.  No nausea. Vomited once yesterday morning, then was able to eat lunch and dinner.  Feels constipation. Genitourinary: Negative for dysuria. Musculoskeletal: Negative for back pain. Skin: Negative for rash. Neurological: Negative for headaches, focal weakness or numbness.  No change in speech. No facial droop. No weakness in arm or leg.  10-point ROS otherwise negative.  ____________________________________________   PHYSICAL EXAM:  VITAL SIGNS: ED Triage Vitals  Enc Vitals Group     BP 03/18/16 0551 121/72 mmHg     Pulse Rate 03/18/16 0551 102     Resp 03/18/16 0551 18     Temp 03/18/16 0551 98.2 F (36.8 C)     Temp Source 03/18/16 0551 Oral     SpO2 03/18/16 0551 93 %     Weight 03/18/16 0550 230 lb (104.327 kg)      Height 03/18/16 0550 6' (1.829 m)     Head Cir --      Peak Flow --      Pain Score --      Pain Loc --      Pain Edu? --      Excl. in [place]? --    Constitutional: Alert and oriented. Well appearing and in no acute distress. Eyes: Conjunctivae are normal. PERRL. EOMI. Head: Atraumatic. Nose: No congestion/rhinnorhea. Mouth/Throat: Mucous membranes are Slightly dry.  Oropharynx non-erythematous. Neck: No stridor.  No cervical tenderness. No meningismus. Cardiovascular: Normal rate, regular rhythm. Grossly normal heart sounds.  Good peripheral circulation. Respiratory: Normal respiratory effort.  No retractions. Lungs CTAB. Gastrointestinal: Soft and nontender. No distention. No abdominal bruits. No CVA tenderness. No tenderness in the right lower quadrant. No rebound guarding or peritonitis. Musculoskeletal: No lower extremity tenderness nor edema.  No joint effusions. Neurologic:  Normal speech and language. No gross focal neurologic deficits are appreciated. No gait instability, he stands up and walks back and forth in the room while. Skin:  Skin is warm, dry and intact except for a small abrasion over the right lateral elbow, full range of motion of the arm and elbow without deformity tenderness or pain. No rash noted. Psychiatric: Mood and affect are pleasant, calm just slightly flat affect. Speech and behavior are normal.  ____________________________________________   LABS (all labs ordered are listed, but only abnormal results are displayed)  Labs Reviewed  URINALYSIS COMPLETEWITH MICROSCOPIC ([facility] ONLY) - Abnormal; Notable for the following:    Color, Urine AMBER (*)    APPearance HAZY (*)    Ketones, ur TRACE (*)    Specific Gravity, Urine 1.031 (*)    Hgb urine dipstick 1+ (*)    Protein, ur 100 (*)    Squamous Epithelial / LPF 0-5 (*)    All other components within normal limits  COMPREHENSIVE METABOLIC PANEL - Abnormal; Notable for the following:    Glucose, Bld 154  (*)    BUN 28 (*)    Creatinine, Ser 1.75 (*)    Calcium 8.7 (*)    GFR calc non Af Amer 39 (*)    GFR calc Af Amer 45 (*)    All other components within normal limits  CBC  TROPONIN I  CBC  CREATININE, SERUM  APTT  PROTIME-INR   ____________________________________________  EKG  Reviewed and interpreted by me and 5:53 AM Heart rate 100 Sinus tachycardia There is a minimal T-wave depression noted in lateral precordial leads, no evidence of obvious acute ischemia  QRS 80 QTc 4:30  It is noted that the patient denies any cardiopulmonary symptoms at the time of this tracing. Specifically denies chest pain, shortness of breath, trouble breathing. ____________________________________________  RADIOLOGY  DG Abd Portable 1V (Final result) Result time: 03/18/16 12:30:48   Final result by Rad Results In Interface (03/18/16 12:30:48)   Narrative:   CLINICAL DATA: NG tube placement.  EXAM: PORTABLE ABDOMEN - 1 VIEW  COMPARISON: 03/18/2016  FINDINGS: The nasogastric tube tip is in the projection of the left upper quadrant of the abdomen in the expected location of the stomach. The side port is below the level of the GE junction.  IMPRESSION: 1. NG tube tip is in the projection of the expected location of the stomach.   Electronically Signed By: Kerby Moors M.D. On: 03/18/2016 12:30          CT Abdomen Pelvis W Contrast (Final result) Result time: 03/18/16 09:14:13   Final result by Rad Results In Interface (03/18/16 09:14:13)   Narrative:   CLINICAL DATA: "Constipation" and bloating. Evaluate for small bowel obstruction.  EXAM: CT ABDOMEN AND PELVIS WITH CONTRAST  TECHNIQUE: Multidetector CT imaging of the abdomen and pelvis was performed using the standard protocol following bolus administration of intravenous contrast.  CONTRAST: 78mL ISOVUE-300 IOPAMIDOL (ISOVUE-300) INJECTION 61%  COMPARISON: None.  FINDINGS: Lower chest and  abdominal wall: Coronary atherosclerosis.  Poor esophageal clearance or gastroesophageal reflux  Hepatobiliary: Tiny low-density in the central liver 2:15, too small to characterize. No significant finding.Cholelithiasis without signs of biliary obstruction or inflammation  Pancreas: Unremarkable.  Spleen: Unremarkable.  Adrenals/Urinary Tract: Negative adrenals. No hydronephrosis or stone. Smooth atrophic appearance of the kidneys. Unremarkable bladder.  Reproductive:No pathologic findings.  Stomach/Bowel: Dilated small bowel with fluid levels leading to transition point at the ileal level, 2:47. Bowel is mildly angulated this level. No evidence of internal hernia, volvulus, or mass. The more distal terminal ileum is subtly thickened with submucosal low density appearance but no mesenteric inflammation or hyper enhancement to confirm enteritis. The colon is not emptied, suggesting partial obstruction, but the distal ileum is completely collapsed and this may be early obstruction. Mild reactive ascites and mesenteric edema. No signs of bowel necrosis. Negative appendix.  Vascular/Lymphatic: Atherosclerosis. No acute vascular abnormality. No mass or adenopathy.  Peritoneal: Small reactive ascites. No pneumoperitoneum.  Musculoskeletal: No acute abnormalities.  IMPRESSION: 1. Ileal small bowel obstruction with no visible cause. No evidence of bowel necrosis. 2. Cholelithiasis.   Electronically Signed By: Monte Fantasia M.D. On: 03/18/2016 09:14          DG Abd 2 Views (Final result) Result time: 03/18/16 07:57:50   Final result by Rad Results In Interface (03/18/16 07:57:50)   Narrative:   CLINICAL DATA: Bloating, constipation, dizziness, fell this morning, congestion, former smoker, unknown surgical history  EXAM: ABDOMEN - 2 VIEW  COMPARISON: None  FINDINGS: Scattered gas and stool in colon.  Dilated small bowel loops up to 4.9 cm  diameter.  Minimal rectal stool.  No definite bowel wall thickening or free air.  Bones demineralized  No urinary tract calcification.  IMPRESSION: Although gas and stool are seen throughout the colon, small bowel loops appear dilated, question due to ileus or obstruction; consider CT with IV and oral contrast to further assess.   Electronically Signed By: Lavonia Dana M.D. On: 03/18/2016 07:57          DG Chest Portable 1 View (Final result) Result time: 03/18/16 07:03:21   Final result by Rad  Results In Interface (03/18/16 07:03:21)   Narrative:   CLINICAL DATA: Dizziness for a few days  EXAM: PORTABLE CHEST 1 VIEW  COMPARISON: None.  FINDINGS: Normal heart size and mediastinal contours. No acute infiltrate or edema. No effusion or pneumothorax. No acute osseous findings.  IMPRESSION: No evidence of active disease.   Electronically Signed By: Monte Fantasia M.D. On: 03/18/2016 07:03          CT Head Wo Contrast (Final result) Result time: 03/18/16 06:48:48   Final result by Rad Results In Interface (03/18/16 06:48:48)   Narrative:   CLINICAL DATA: Dizziness, fall this morning.  EXAM: CT HEAD WITHOUT CONTRAST  TECHNIQUE: Contiguous axial images were obtained from the base of the skull through the vertex without intravenous contrast.  COMPARISON: None.  FINDINGS: Brain: No evidence of acute infarction, hemorrhage, extra-axial collection, ventriculomegaly, or mass effect. Generalized cerebral atrophy.  Vascular: No hyperdense vessel or unexpected calcification.  Skull: Negative for fracture or focal lesion.  Sinuses/Orbits: No acute findings.  Other: None.  IMPRESSION: 1. No acute intracranial abnormality. 2. Generalized cerebral atrophy.   Electronically Signed By: Jeb Levering M.D. On: 03/18/2016 06:48          ____________________________________________   PROCEDURES  Procedure(s)  performed: None  Critical Care performed: No  ____________________________________________   INITIAL IMPRESSION / ASSESSMENT AND PLAN / ED COURSE  Pertinent labs & imaging results that were available during my care of the patient were reviewed by me and considered in my medical decision making (see chart for details).  Patient presents for evaluation of systems slight fatigue, vomiting once, not eating well, having low-grade temperature. Overall his physical exam is quite reassuring. He does appear to slightly dehydrated with dry mucous membranes, but otherwise appears pre-well.  The patient and his sister reported this happens him from time to time, sometimes associated with being constipated. He denies any previous surgical history on the abdomen, he denies having any abdominal pain, his abdominal exam does not demonstrate any focal abnormality or tenderness. Overall his exam is reassuring, and I suspect he may have a mild viral type illness, possibly also some constipation. However, we will check labs, and these are reassuring for mildly elevated creatinine however he does appear to carry a history of mild renal insufficiency. In addition his EKG shows a very nonspecific abnormalities in lateral, but he is not having any cardiopulmonary symptoms and after 2-3 days of illness troponin is normal. I doubt acute cardiopulmonary etiology.  I discussed with the patient and his sister the risks and benefits of abdominal CT scan. The present time there is no clear indication that the patient requires CT, the patient does have an abdominal complaint but exam does not suggest acute surgical abdomen and my suspicion for intra-abdominal infection including appendicitis, cholecystitis, aaa, dissection, ischemia, perforation, pancreatitis, diverticulitis or other acute major intra-abdominal process is quite low. After discussing the risks and benefits including benefits of additional evaluation for diagnoses,  ruling out infection/perforation/aaa/etc, but also discussing the risks including low, "well less than 1%," but not 0 risk of inducing cancers due to radiation and potential risks of contrast the patient indicated via our shared medical decision-making that she would not do a CAT scan. Rather if the patient does have worsening symptoms, develops a high fever, develops pain or persistent discomfort in the right upper quadrant or right lower quadrant, or other new concerns arise they will come back to emergency room right away. As the patient's clinician I think this  is a very reasonable decision having discussed general risks and benefits of CT, and my clinical suspicion that CT would be of benefit at this time is very low.  ____________________________________________   FINAL CLINICAL IMPRESSION(S) / ED DIAGNOSES  Final diagnoses:  Small bowel obstruction ([facility])      Delman Kitten, MD 03/18/16 1435

## 2016-03-18 NOTE — ED Notes (Signed)
Pt in with co fever since today and dizziness since yest.  Has fallen tonight due to dizziness has abrasion to right elbow.

## 2016-03-18 NOTE — ED Notes (Signed)
Pt resting in bed, denies any pain, family at bedside.

## 2016-03-18 NOTE — ED Notes (Signed)
Pt uprite on stretcher in exam room with no distress noted; pt reports last few days having congestion, abd bloating but no pain, nausea; denies urinary or bowel c/o but does have history constipation (last BM 2 days ago); took 4-81mg  ASA at 11pm for ?fever; also reports some dizziness with a fall this morning--abrasion to right elbow but denies any other injuries or c/o or syncope; +BS, abd soft/nontender but distended; resp even/unlab, lungs clear, apical audible and regular, +PP, -edema

## 2016-03-19 ENCOUNTER — Inpatient Hospital Stay: Payer: Medicare PPO

## 2016-03-19 DIAGNOSIS — F2089 Other schizophrenia: Secondary | ICD-10-CM

## 2016-03-19 LAB — CBC
HEMATOCRIT: 35.8 % — AB (ref 40.0–52.0)
Hemoglobin: 12 g/dL — ABNORMAL LOW (ref 13.0–18.0)
MCH: 29.2 pg (ref 26.0–34.0)
MCHC: 33.6 g/dL (ref 32.0–36.0)
MCV: 86.9 fL (ref 80.0–100.0)
PLATELETS: 188 10*3/uL (ref 150–440)
RBC: 4.12 MIL/uL — AB (ref 4.40–5.90)
RDW: 13.9 % (ref 11.5–14.5)
WBC: 5.6 10*3/uL (ref 3.8–10.6)

## 2016-03-19 LAB — BASIC METABOLIC PANEL
ANION GAP: 1 — AB (ref 5–15)
BUN: 26 mg/dL — ABNORMAL HIGH (ref 6–20)
CALCIUM: 7.9 mg/dL — AB (ref 8.9–10.3)
CHLORIDE: 107 mmol/L (ref 101–111)
CO2: 27 mmol/L (ref 22–32)
CREATININE: 1.63 mg/dL — AB (ref 0.61–1.24)
GFR calc non Af Amer: 42 mL/min — ABNORMAL LOW (ref >60)
GFR, EST AFRICAN AMERICAN: 49 mL/min — AB (ref >60)
GLUCOSE: 117 mg/dL — AB (ref 65–99)
Potassium: 3.5 mmol/L (ref 3.5–5.1)
Sodium: 135 mmol/L (ref 135–145)

## 2016-03-19 LAB — MAGNESIUM: Magnesium: 2 mg/dL (ref 1.7–2.4)

## 2016-03-19 LAB — MRSA PCR SCREENING: MRSA by PCR: NEGATIVE

## 2016-03-19 MED ORDER — OLANZAPINE 5 MG PO TBDP
30.0000 mg | ORAL_TABLET | Freq: Every day | ORAL | Status: DC
  Administered 2016-03-19 – 2016-03-25 (×7): 30 mg via ORAL
  Filled 2016-03-19: qty 6
  Filled 2016-03-19 (×3): qty 3
  Filled 2016-03-19 (×2): qty 6
  Filled 2016-03-19: qty 3
  Filled 2016-03-19: qty 6

## 2016-03-19 MED ORDER — DEXTROSE 5 % IV SOLN
2.0000 g | INTRAVENOUS | Status: AC
  Administered 2016-03-20: 2 g via INTRAVENOUS
  Filled 2016-03-19: qty 2

## 2016-03-19 MED ORDER — FLEET ENEMA 7-19 GM/118ML RE ENEM
1.0000 | ENEMA | Freq: Once | RECTAL | Status: AC
  Administered 2016-03-19: 1 via RECTAL

## 2016-03-19 MED ORDER — LORAZEPAM 2 MG/ML IJ SOLN: 2.0000 mg | Freq: Every day | INTRAMUSCULAR | Status: DC

## 2016-03-19 MED ORDER — HALOPERIDOL LACTATE 5 MG/ML IJ SOLN
2.0000 mg | Freq: Every day | INTRAMUSCULAR | Status: DC
  Administered 2016-03-19 – 2016-03-25 (×6): 2 mg via INTRAVENOUS
  Filled 2016-03-19 (×8): qty 1

## 2016-03-19 MED ORDER — HEPARIN SODIUM (PORCINE) 5000 UNIT/ML IJ SOLN: 5000.0000 [IU] | Freq: Once | INTRAMUSCULAR | Status: DC

## 2016-03-19 MED ORDER — CLONAZEPAM 0.125 MG PO TBDP
1.0000 mg | ORAL_TABLET | Freq: Every day | ORAL | Status: DC
  Administered 2016-03-19 – 2016-03-25 (×7): 1 mg via ORAL
  Filled 2016-03-19 (×7): qty 8

## 2016-03-19 NOTE — Progress Notes (Signed)
CC: SBO Subjective:  Feeling better, pending Psych consult. KUB mild dilated SB. NGT 1800cc Does not c/o abd pain  Objective: Vital signs in last 24 hours: Temp:  [98.5 F (36.9 C)-99.3 F (37.4 C)] 98.9 F (37.2 C) (04/07 1012) Pulse Rate:  [84-95] 84 (04/07 1012) Resp:  [14-18] 18 (04/07 1012) BP: (108-150)/(61-91) 108/61 mmHg (04/07 1012) SpO2:  [90 %-98 %] 94 % (04/07 1012)    Intake/Output from previous day: 04/06 0701 - 04/07 0700 In: 1164 [I.V.:1164] Out: 2125 [Urine:325; Emesis/NG output:1800] Intake/Output this shift: Total I/O In: 434 [I.V.:434] Out: 100 [Emesis/NG output:100]  Physical exam: NAD Chest : CTA, NSR Abd: midly distended, soft, NT, no peritonitis, + BS Ext: no edema Neuro: awake alert, no motor deficits Lab Results: CBC   Recent Labs  03/18/16 0617 03/19/16 0458  WBC 7.3 5.6  HGB 13.7 12.0*  HCT 40.4 35.8*  PLT 189 188   BMET  Recent Labs  03/18/16 0617 03/19/16 0458  NA 135 135  K 3.9 3.5  CL 103 107  CO2 23 27  GLUCOSE 154* 117*  BUN 28* 26*  CREATININE 1.75* 1.63*  CALCIUM 8.7* 7.9*   PT/INR  Recent Labs  03/18/16 1649  LABPROT 14.6  INR 1.12   ABG No results for input(s): PHART, HCO3 in the last 72 hours.  Invalid input(s): PCO2, PO2  Studies/Results: Dg Abd 1 View  03/19/2016  CLINICAL DATA:  Nasogastric tube placement EXAM: ABDOMEN - 1 VIEW COMPARISON:  Study obtained earlier in the day FINDINGS: Nasogastric tube tip and side port are in the stomach. There are multiple loops of mildly dilated small bowel, not significantly changed. No air-fluid levels are appreciable. No free air. There is mild atelectasis in the left lung base. IMPRESSION: Nasogastric tube tip and side port in stomach. Loops of mildly dilated small bowel remain. No free air evident. Electronically Signed   By: Lowella Grip III M.D.   On: 03/19/2016 07:57   Ct Head Wo Contrast  03/18/2016  CLINICAL DATA:  Dizziness, fall this morning. EXAM:  CT HEAD WITHOUT CONTRAST TECHNIQUE: Contiguous axial images were obtained from the base of the skull through the vertex without intravenous contrast. COMPARISON:  None. FINDINGS: Brain: No evidence of acute infarction, hemorrhage, extra-axial collection, ventriculomegaly, or mass effect. Generalized cerebral atrophy. Vascular: No hyperdense vessel or unexpected calcification. Skull: Negative for fracture or focal lesion. Sinuses/Orbits: No acute findings. Other: None. IMPRESSION: 1.  No acute intracranial abnormality. 2. Generalized cerebral atrophy. Electronically Signed   By: Jeb Levering M.D.   On: 03/18/2016 06:48   Ct Abdomen Pelvis W Contrast  03/18/2016  CLINICAL DATA:  "Constipation" and bloating. Evaluate for small bowel obstruction. EXAM: CT ABDOMEN AND PELVIS WITH CONTRAST TECHNIQUE: Multidetector CT imaging of the abdomen and pelvis was performed using the standard protocol following bolus administration of intravenous contrast. CONTRAST:  4mL ISOVUE-300 IOPAMIDOL (ISOVUE-300) INJECTION 61% COMPARISON:  None. FINDINGS: Lower chest and abdominal wall:  Coronary atherosclerosis. Poor esophageal clearance or gastroesophageal reflux Hepatobiliary: Tiny low-density in the central liver 2:15, too small to characterize. No significant finding.Cholelithiasis without signs of biliary obstruction or inflammation Pancreas: Unremarkable. Spleen: Unremarkable. Adrenals/Urinary Tract: Negative adrenals. No hydronephrosis or stone. Smooth atrophic appearance of the kidneys. Unremarkable bladder. Reproductive:No pathologic findings. Stomach/Bowel: Dilated small bowel with fluid levels leading to transition point at the ileal level, 2:47. Bowel is mildly angulated this level. No evidence of internal hernia, volvulus, or mass. The more distal terminal ileum is subtly thickened  with submucosal low density appearance but no mesenteric inflammation or hyper enhancement to confirm enteritis. The colon is not emptied,  suggesting partial obstruction, but the distal ileum is completely collapsed and this may be early obstruction. Mild reactive ascites and mesenteric edema. No signs of bowel necrosis. Negative appendix. Vascular/Lymphatic: Atherosclerosis. No acute vascular abnormality. No mass or adenopathy. Peritoneal: Small reactive ascites.  No pneumoperitoneum. Musculoskeletal: No acute abnormalities. IMPRESSION: 1. Ileal small bowel obstruction with no visible cause. No evidence of bowel necrosis. 2. Cholelithiasis. Electronically Signed   By: Monte Fantasia M.D.   On: 03/18/2016 09:14   Dg Chest Portable 1 View  03/18/2016  CLINICAL DATA:  Dizziness for a few days EXAM: PORTABLE CHEST 1 VIEW COMPARISON:  None. FINDINGS: Normal heart size and mediastinal contours. No acute infiltrate or edema. No effusion or pneumothorax. No acute osseous findings. IMPRESSION: No evidence of active disease. Electronically Signed   By: Monte Fantasia M.D.   On: 03/18/2016 07:03   Dg Abd 2 Views  03/18/2016  CLINICAL DATA:  Bloating, constipation, dizziness, fell this morning, congestion, former smoker, unknown surgical history EXAM: ABDOMEN - 2 VIEW COMPARISON:  None FINDINGS: Scattered gas and stool in colon. Dilated small bowel loops up to 4.9 cm diameter. Minimal rectal stool. No definite bowel wall thickening or free air. Bones demineralized No urinary tract calcification. IMPRESSION: Although gas and stool are seen throughout the colon, small bowel loops appear dilated, question due to ileus or obstruction; consider CT with IV and oral contrast to further assess. Electronically Signed   By: Lavonia Dana M.D.   On: 03/18/2016 07:57   Dg Abd Portable 1v  03/18/2016  CLINICAL DATA:  NG tube placement. EXAM: PORTABLE ABDOMEN - 1 VIEW COMPARISON:  03/18/2016 FINDINGS: The nasogastric tube tip is in the projection of the left upper quadrant of the abdomen in the expected location of the stomach. The side port is below the level of the GE  junction. IMPRESSION: 1. NG tube tip is in the projection of the expected location of the stomach. Electronically Signed   By: Kerby Moors M.D.   On: 03/18/2016 12:30   Dg Abd Portable 2v  03/19/2016  CLINICAL DATA:  Small bowel obstruction. EXAM: PORTABLE ABDOMEN - 2 VIEW COMPARISON:  Radiograph and CT yesterday. FINDINGS: The enteric tube is no longer seen. Unchanged gaseous distention of small bowel loops. No evidence of free air. Small volume of colonic stool. Excreted intravenous contrast in the urinary bladder. IMPRESSION: 1. Unchanged-as distention small-bowel loops consistent with small-bowel obstruction. 2. Enteric tube is no longer seen. Electronically Signed   By: Jeb Levering M.D.   On: 03/19/2016 06:23    Anti-infectives: Anti-infectives    None      Assessment/Plan: SBO, unusual presentation , we will obtain SBFT , may need surgical intervention if obstruction persists. Extensive counseling provided to the family. Psych to eval  Caroleen Hamman, MD, [facility]  03/19/2016
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
## 2016-03-19 NOTE — Progress Notes (Signed)
SBFT reviewed, persistent bowel obstruction, pt is asymptomatic , no pain. D/w the pt and I recommend laparotomy differential include adhesion, gallstone ileus or malignancy. D/W the pt and family in detail. We will post him for laparotomy in the am. Currently no signs of bowel ischemia or necrosis Extensive counseling provided.

## 2016-03-20 ENCOUNTER — Inpatient Hospital Stay: Payer: Medicare PPO | Admitting: Anesthesiology

## 2016-03-20 ENCOUNTER — Encounter
Admission: EM | Disposition: A | Discharge status: Home / Self Care | Payer: Self-pay | Source: Home / Self Care | Attending: Surgery

## 2016-03-20 ENCOUNTER — Encounter: Payer: Self-pay | Admitting: Anesthesiology

## 2016-03-20 HISTORY — PX: LAPAROTOMY: SHX154

## 2016-03-20 LAB — BASIC METABOLIC PANEL
Anion gap: 5 (ref 5–15)
BUN: 20 mg/dL (ref 6–20)
CALCIUM: 7.8 mg/dL — AB (ref 8.9–10.3)
CO2: 27 mmol/L (ref 22–32)
Chloride: 100 mmol/L — ABNORMAL LOW (ref 101–111)
Creatinine, Ser: 1.47 mg/dL — ABNORMAL HIGH (ref 0.61–1.24)
GFR calc Af Amer: 56 mL/min — ABNORMAL LOW (ref >60)
GFR, EST NON AFRICAN AMERICAN: 48 mL/min — AB (ref >60)
GLUCOSE: 100 mg/dL — AB (ref 65–99)
POTASSIUM: 3.4 mmol/L — AB (ref 3.5–5.1)
Sodium: 132 mmol/L — ABNORMAL LOW (ref 135–145)

## 2016-03-20 LAB — CBC
HCT: 37.8 % — ABNORMAL LOW (ref 40.0–52.0)
Hemoglobin: 12.6 g/dL — ABNORMAL LOW (ref 13.0–18.0)
MCH: 29.4 pg (ref 26.0–34.0)
MCHC: 33.4 g/dL (ref 32.0–36.0)
MCV: 87.8 fL (ref 80.0–100.0)
PLATELETS: 188 10*3/uL (ref 150–440)
RBC: 4.3 MIL/uL — ABNORMAL LOW (ref 4.40–5.90)
RDW: 13.8 % (ref 11.5–14.5)
WBC: 5.8 10*3/uL (ref 3.8–10.6)

## 2016-03-20 SURGERY — LAPAROTOMY, EXPLORATORY
Anesthesia: General | Site: Abdomen | Wound class: Clean Contaminated

## 2016-03-20 MED ORDER — BUPIVACAINE-EPINEPHRINE (PF) 0.25% -1:200000 IJ SOLN
INTRAMUSCULAR | Status: DC | PRN
  Administered 2016-03-20: 30 mL via PERINEURAL

## 2016-03-20 MED ORDER — DIPHENHYDRAMINE HCL 50 MG/ML IJ SOLN: 12.5000 mg | Freq: Four times a day (QID) | INTRAMUSCULAR | Status: DC | PRN

## 2016-03-20 MED ORDER — FENTANYL CITRATE (PF) 100 MCG/2ML IJ SOLN: 25.0000 ug | INTRAMUSCULAR | Status: DC | PRN

## 2016-03-20 MED ORDER — DIPHENHYDRAMINE HCL 12.5 MG/5ML PO ELIX: 12.5000 mg | ORAL_SOLUTION | Freq: Four times a day (QID) | ORAL | Status: DC | PRN

## 2016-03-20 MED ORDER — ACETAMINOPHEN 10 MG/ML IV SOLN
1000.0000 mg | Freq: Four times a day (QID) | INTRAVENOUS | Status: AC
  Administered 2016-03-20 – 2016-03-21 (×3): 1000 mg via INTRAVENOUS
  Filled 2016-03-20 (×4): qty 100

## 2016-03-20 MED ORDER — HEPARIN SODIUM (PORCINE) 5000 UNIT/ML IJ SOLN
5000.0000 [IU] | Freq: Three times a day (TID) | INTRAMUSCULAR | Status: DC
  Administered 2016-03-21 – 2016-03-26 (×16): 5000 [IU] via SUBCUTANEOUS
  Filled 2016-03-20 (×17): qty 1

## 2016-03-20 MED ORDER — ONDANSETRON HCL 4 MG/2ML IJ SOLN
INTRAMUSCULAR | Status: DC | PRN
Start: 2016-03-20 — End: 2016-03-20
  Administered 2016-03-20: 4 mg via INTRAVENOUS

## 2016-03-20 MED ORDER — BUPIVACAINE-EPINEPHRINE (PF) 0.25% -1:200000 IJ SOLN
INTRAMUSCULAR | Status: AC
  Filled 2016-03-20: qty 30

## 2016-03-20 MED ORDER — LIDOCAINE HCL (CARDIAC) 20 MG/ML IV SOLN
INTRAVENOUS | Status: DC | PRN
  Administered 2016-03-20: 50 mg via INTRAVENOUS

## 2016-03-20 MED ORDER — MIDAZOLAM HCL 5 MG/5ML IJ SOLN
INTRAMUSCULAR | Status: DC | PRN
  Administered 2016-03-20: 2 mg via INTRAVENOUS

## 2016-03-20 MED ORDER — ONDANSETRON HCL 4 MG/2ML IJ SOLN: 4.0000 mg | Freq: Four times a day (QID) | INTRAMUSCULAR | Status: DC | PRN

## 2016-03-20 MED ORDER — KETOROLAC TROMETHAMINE 30 MG/ML IJ SOLN
INTRAMUSCULAR | Status: DC | PRN
  Administered 2016-03-20: 30 mg via INTRAVENOUS

## 2016-03-20 MED ORDER — PROPOFOL 10 MG/ML IV BOLUS
INTRAVENOUS | Status: DC | PRN
  Administered 2016-03-20: 200 mg via INTRAVENOUS

## 2016-03-20 MED ORDER — ACETAMINOPHEN 10 MG/ML IV SOLN
INTRAVENOUS | Status: AC
  Filled 2016-03-20: qty 100

## 2016-03-20 MED ORDER — LACTATED RINGERS IV SOLN
INTRAVENOUS | Status: DC | PRN
Start: 2016-03-20 — End: 2016-03-20
  Administered 2016-03-20: 10:00:00 via INTRAVENOUS

## 2016-03-20 MED ORDER — NALOXONE HCL 0.4 MG/ML IJ SOLN: 0.4000 mg | INTRAMUSCULAR | Status: DC | PRN

## 2016-03-20 MED ORDER — SODIUM CHLORIDE 0.9% FLUSH: 9.0000 mL | INTRAVENOUS | Status: DC | PRN

## 2016-03-20 MED ORDER — ONDANSETRON HCL 4 MG/2ML IJ SOLN: 4.0000 mg | Freq: Once | INTRAMUSCULAR | Status: DC | PRN

## 2016-03-20 MED ORDER — FENTANYL CITRATE (PF) 100 MCG/2ML IJ SOLN
INTRAMUSCULAR | Status: DC | PRN
  Administered 2016-03-20: 100 ug via INTRAVENOUS

## 2016-03-20 MED ORDER — MORPHINE SULFATE 2 MG/ML IV SOLN
INTRAVENOUS | Status: DC
  Administered 2016-03-20: 3 mg via INTRAVENOUS
  Administered 2016-03-20: 14:00:00 via INTRAVENOUS
  Administered 2016-03-20 – 2016-03-21 (×2): 2 mg via INTRAVENOUS
  Administered 2016-03-21 (×2): 0 mg via INTRAVENOUS
  Administered 2016-03-21 – 2016-03-22 (×3): 1 mg via INTRAVENOUS
  Filled 2016-03-20: qty 25

## 2016-03-20 MED ORDER — ACETAMINOPHEN 10 MG/ML IV SOLN
INTRAVENOUS | Status: DC | PRN
  Administered 2016-03-20: 1000 mg via INTRAVENOUS

## 2016-03-20 MED ORDER — DEXAMETHASONE SODIUM PHOSPHATE 10 MG/ML IJ SOLN
INTRAMUSCULAR | Status: DC | PRN
  Administered 2016-03-20: 5 mg via INTRAVENOUS

## 2016-03-20 SURGICAL SUPPLY — 42 items
APPLIER CLIP 11 MED OPEN (CLIP)
APPLIER CLIP 13 LRG OPEN (CLIP)
BARRIER ADH SEPRAFILM 3INX5IN (MISCELLANEOUS) ×4 IMPLANT
BLADE CLIPPER SURG (BLADE) IMPLANT
BLADE SURG 15 STRL LF DISP TIS (BLADE) ×2 IMPLANT
BLADE SURG 15 STRL SS (BLADE) ×2
CANISTER SUCT 3000ML (MISCELLANEOUS) ×4 IMPLANT
CHLORAPREP W/TINT 26ML (MISCELLANEOUS) ×4 IMPLANT
CLIP APPLIE 11 MED OPEN (CLIP) IMPLANT
CLIP APPLIE 13 LRG OPEN (CLIP) IMPLANT
DRAPE LAPAROTOMY 100X77 ABD (DRAPES) ×4 IMPLANT
DRAPE TABLE BACK 80X90 (DRAPES) ×4 IMPLANT
DRSG TEGADERM 2-3/8X2-3/4 SM (GAUZE/BANDAGES/DRESSINGS) IMPLANT
DRSG TELFA 3X8 NADH (GAUZE/BANDAGES/DRESSINGS) ×8 IMPLANT
ELECT BLADE 6.5 EXT (BLADE) ×4 IMPLANT
ELECT REM PT RETURN 9FT ADLT (ELECTROSURGICAL) ×4
ELECTRODE REM PT RTRN 9FT ADLT (ELECTROSURGICAL) ×2 IMPLANT
GAUZE SPONGE 4X4 12PLY STRL (GAUZE/BANDAGES/DRESSINGS) ×4 IMPLANT
GLOVE BIO SURGEON STRL SZ7 (GLOVE) ×4 IMPLANT
GOWN STRL REUS W/ TWL LRG LVL3 (GOWN DISPOSABLE) ×4 IMPLANT
GOWN STRL REUS W/TWL LRG LVL3 (GOWN DISPOSABLE) ×4
HANDLE SUCTION POOLE (INSTRUMENTS) ×2 IMPLANT
HANDLE YANKAUER SUCT BULB TIP (MISCELLANEOUS) ×4 IMPLANT
LABEL OR SOLS (LABEL) ×4 IMPLANT
NEEDLE HYPO 25X1 1.5 SAFETY (NEEDLE) ×4 IMPLANT
PACK BASIN MAJOR ARMC (MISCELLANEOUS) ×4 IMPLANT
SPONGE LAP 18X18 5 PK (GAUZE/BANDAGES/DRESSINGS) ×4 IMPLANT
SPONGE LAP 18X36 2PK (MISCELLANEOUS) ×4 IMPLANT
STAPLER SKIN PROX 35W (STAPLE) ×4 IMPLANT
SUCTION POOLE HANDLE (INSTRUMENTS) ×4
SUT PDS AB 1 TP1 96 (SUTURE) ×8 IMPLANT
SUT SILK 2 0 (SUTURE) ×2
SUT SILK 2 0 SH CR/8 (SUTURE) ×4 IMPLANT
SUT SILK 2 0SH CR/8 30 (SUTURE) ×4 IMPLANT
SUT SILK 2-0 18XBRD TIE 12 (SUTURE) ×2 IMPLANT
SUT VIC AB 0 CT1 36 (SUTURE) ×8 IMPLANT
SUT VIC AB 2-0 SH 27 (SUTURE) ×4
SUT VIC AB 2-0 SH 27XBRD (SUTURE) ×4 IMPLANT
SYR 20CC LL (SYRINGE) ×8 IMPLANT
SYR 3ML LL SCALE MARK (SYRINGE) IMPLANT
TAPE MICROFOAM 4IN (TAPE) IMPLANT
TRAY FOLEY W/METER SILVER 16FR (SET/KITS/TRAYS/PACK) ×4 IMPLANT

## 2016-03-20 NOTE — Progress Notes (Signed)
Persistent bowel obstruction in need of surgical intervention. 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 Discussion about risks benefits of the procedure and possible complications including but not limited to bleeding, infection, re-intervention and even death. Specifically we talked about blood preserving the strategies for Patrica Duel was witnesses. The do not wish to get any blood transfusions, they are not comfortable with autologus blood, cell saver likely to work given the contamination and potential for cancer in this case. I contemplated epogen  but his hemoglobin is normal and there is a risk of DVT and I think the risk of DVT outweight the benefits.. We will hold any heparin for now and in the immediate postoperative period. We will leave [place] and deeper GEN on a when necessary basis and we are likely to lose much blood about indicated we do will make every effort for blood alternatives. We will respect his wishes to refuse blood Extensive counseling provided

## 2016-03-20 NOTE — Plan of Care (Signed)
Problem: Pain Managment: Goal: General experience of comfort will improve Outcome: Progressing PCA in place for adequate pain management

## 2016-03-20 NOTE — Anesthesia Preprocedure Evaluation (Addendum)
Anesthesia Evaluation  Patient identified by MRN, date of birth, ID band Patient awake    Reviewed: Allergy & Precautions, NPO status , Patient's Chart, lab work & pertinent test results  Airway Mallampati: II  TM Distance: >3 FB     Dental  (+) Upper Dentures, Lower Dentures   Pulmonary former smoker,    Pulmonary exam normal        Cardiovascular negative cardio ROS Normal cardiovascular exam     Neuro/Psych Schizophrenia negative neurological ROS     GI/Hepatic Neg liver ROS, Small bowel obstruction   Endo/Other  negative endocrine ROS  Renal/GU Renal InsufficiencyRenal disease  negative genitourinary   Musculoskeletal negative musculoskeletal ROS (+)   Abdominal Normal abdominal exam  (+)   Peds negative pediatric ROS (+)  Hematology negative hematology ROS (+)   Anesthesia Other Findings + Jehovah witness  Reproductive/Obstetrics                         Anesthesia Physical Anesthesia Plan  ASA: II and emergent  Anesthesia Plan: General   Post-op Pain Management:    Induction: Intravenous, Rapid sequence and Cricoid pressure planned  Airway Management Planned: Oral ETT  Additional Equipment:   Intra-op Plan:   Post-operative Plan: Extubation in OR  Informed Consent: I have reviewed the patients History and Physical, chart, labs and discussed the procedure including the risks, benefits and alternatives for the proposed anesthesia with the patient or authorized representative who has indicated his/her understanding and acceptance.   Dental advisory given  Plan Discussed with: CRNA and Surgeon  Anesthesia Plan Comments:        Anesthesia Quick Evaluation

## 2016-03-20 NOTE — Op Note (Signed)
   Pre-operative Diagnosis:SBO  Post-operative Diagnosis: Same  Surgeon: Jules Husbands , MD FACS  PROCEDURES: Exploratory laparotomy with lysis of adhesions  Anesthesia: General endotracheal anesthesia  ASA Class: 2   Surgeon: Caroleen Hamman , MD FACS  Anesthesia: Gen. with endotracheal tube   Findings: Transition point TI adhesive band from the mesentery of the small bowel to the bowel  Wall, mild inflammatory response in the mesentery of the terminal ileum, cannot rule out Crohn's disease. Findings were very mild andnot classic for chron's,these are likely  related to inflammatory response from bowel obstruction. No intra-abdominal malignancies  Estimated Blood Loss: 50 cc         Drains: none         Specimens: none          Complications: none               Condition: stable  Procedure Details  The patient was seen again in the Holding Room. The benefits, complications, treatment options, and expected outcomes were discussed with the patient. The risks of bleeding, infection, recurrence of symptoms, failure to resolve symptoms,  bowel injury, any of which could require further surgery were reviewed with the patient.   The patient was taken to Operating Room, identified as [name] and the procedure verified.  A Time Out was held and the above information confirmed.  Prior to the induction of general anesthesia, antibiotic prophylaxis was administered. VTE prophylaxis was in place. General endotracheal anesthesia was then administered and tolerated well. After the induction, the abdomen was prepped with Chloraprep and draped in the sterile fashion. The patient was positioned in the supine position. The laparotomy performing a standard fashion using a 10 blade knife abdominal cavity entered under direct visualization. The bowel was viable with no evidence of ischemia or necrosis. There was some mild inflammatory changes on the terminal ileum with a 2 incision area this  point. There were some adhesive bands that were lysed with Metzenbaum scissors. No need for any bowel resection. There was no evidence of any masses or any other intra-abdominal pathology. The bowel was run from the ligament of Treitz to the terminal ileum and they were no other pathological lesions. The call was palpated and inspected and there was no evidence of active disease or masses. We verified the position of the NG tube base of the stomach. He radiation performed in the midline laparotomy was closed with a running #1 PDS and the skin was closed with staples. We used Marcaine to inject on the incision site. Needle and laparotomy counts were correct and there were no immediate complications   Caroleen Hamman, MD, FACS

## 2016-03-20 NOTE — Transfer of Care (Signed)
Immediate Anesthesia Transfer of Care Note  Patient: [name]  Procedure(s) Performed: Procedure(s): EXPLORATORY LAPAROTOMY with lysis of adhesions (N/A)  Patient Location: PACU  Anesthesia Type:General  Level of Consciousness: Alert, Awake, Oriented  Airway & Oxygen Therapy: Patient Spontanous Breathing  Post-op Assessment: Report given to RN  Post vital signs: Reviewed and stable  Last Vitals:  Filed Vitals:   03/20/16 0510 03/20/16 1220  BP: 123/74 118/71  Pulse: 91 90  Temp: 36.6 C 37 C  Resp: 20 17    Complications: No apparent anesthesia complications

## 2016-03-20 NOTE — Anesthesia Procedure Notes (Signed)
Procedure Name: Intubation Date/Time: 03/20/2016 10:31 AM Performed by: Eliberto Ivory Pre-anesthesia Checklist: Patient identified, Emergency Drugs available, Suction available, Patient being monitored and Timeout performed Patient Re-evaluated:Patient Re-evaluated prior to inductionOxygen Delivery Method: Circle system utilized Preoxygenation: Pre-oxygenation with 100% oxygen Intubation Type: Rapid sequence and IV induction Ventilation: Mask ventilation without difficulty Laryngoscope Size: Mac and 3 Grade View: Grade II Tube type: Oral Tube size: 7.5 mm Number of attempts: 1 Placement Confirmation: ETT inserted through vocal cords under direct vision,  positive ETCO2,  CO2 detector and breath sounds checked- equal and bilateral Secured at: 21 cm Tube secured with: Tape

## 2016-03-21 LAB — BASIC METABOLIC PANEL
ANION GAP: 5 (ref 5–15)
BUN: 18 mg/dL (ref 6–20)
CO2: 27 mmol/L (ref 22–32)
Calcium: 7.9 mg/dL — ABNORMAL LOW (ref 8.9–10.3)
Chloride: 105 mmol/L (ref 101–111)
Creatinine, Ser: 1.36 mg/dL — ABNORMAL HIGH (ref 0.61–1.24)
GFR, EST NON AFRICAN AMERICAN: 53 mL/min — AB (ref >60)
GLUCOSE: 126 mg/dL — AB (ref 65–99)
POTASSIUM: 4.2 mmol/L (ref 3.5–5.1)
Sodium: 137 mmol/L (ref 135–145)

## 2016-03-21 LAB — CBC
HEMATOCRIT: 38.5 % — AB (ref 40.0–52.0)
HEMOGLOBIN: 12.8 g/dL — AB (ref 13.0–18.0)
MCH: 29.6 pg (ref 26.0–34.0)
MCHC: 33.3 g/dL (ref 32.0–36.0)
MCV: 88.8 fL (ref 80.0–100.0)
Platelets: 192 10*3/uL (ref 150–440)
RBC: 4.34 MIL/uL — ABNORMAL LOW (ref 4.40–5.90)
RDW: 13.8 % (ref 11.5–14.5)
WBC: 9.5 10*3/uL (ref 3.8–10.6)

## 2016-03-21 NOTE — Progress Notes (Signed)
Pt had 14 Fr foley catheter placed. Urine return 600 mL. Pt educated and tolerated well.

## 2016-03-21 NOTE — Progress Notes (Signed)
CC: POD # 2 s/p LOA Subjective: Doing well, pain well controlled NGT 150cc  Objective: Vital signs in last 24 hours: Temp:  [95.6 F (35.3 C)-98.6 F (37 C)] 97.4 F (36.3 C) (04/09 0531) Pulse Rate:  [58-95] 72 (04/09 0531) Resp:  [14-18] 16 (04/09 0800) BP: (89-120)/(57-73) 110/69 mmHg (04/09 0531) SpO2:  [91 %-99 %] 93 % (04/09 0800) Last BM Date: 03/25/16  Intake/Output from previous day: 04/08 0701 - 04/09 0700 In: 4618.5 [I.V.:4428.5; NG/GT:90; IV Piggyback:100] Out: 1620 [Urine:1420; Emesis/NG output:150; Blood:50] Intake/Output this shift: Total I/O In: 469 [I.V.:469] Out: 50 [Emesis/NG output:50]  Physical exam: NAD Abd: soft, dressing intact, no erythema, no peritonitis  Lab Results: CBC   Recent Labs  03/20/16 0538 03/21/16 0452  WBC 5.8 9.5  HGB 12.6* 12.8*  HCT 37.8* 38.5*  PLT 188 192   BMET  Recent Labs  03/20/16 0538 03/21/16 0452  NA 132* 137  K 3.4* 4.2  CL 100* 105  CO2 27 27  GLUCOSE 100* 126*  BUN 20 18  CREATININE 1.47* 1.36*  CALCIUM 7.8* 7.9*   PT/INR  Recent Labs  03/18/16 1649  LABPROT 14.6  INR 1.12   ABG No results for input(s): PHART, HCO3 in the last 72 hours.  Invalid input(s): PCO2, PO2  Studies/Results: Dg Small Bowel  March 25, 2016  CLINICAL DATA:  Small bowel obstruction EXAM: SMALL BOWEL SERIES COMPARISON:  CT abdomen and pelvis March 18, 2016 TECHNIQUE: Following ingestion of thin barium, serial small bowel images were obtained including spot views of the terminal ileum. FLUOROSCOPY TIME:  Fluoroscopy Time (in minutes and seconds): 0 minutes 42 second Number of Acquired Images:  4 FINDINGS: Preliminary abdomen image shows multiple loops of dilated small bowel. Nasogastric tube tip in stomach. Images were obtained over an approximately 6 hour time interval. There is dilatation of small bowel to the level of the mid ileum. There is a transition zone in the ileum near the junction of the mid and distal thirds. A small  amount of contrast passes through this area, and the terminal ileum appears unremarkable. There is no demonstrable fixation, angulation, or tethering. No extrinsic mass is seen. IMPRESSION: Small bowel obstruction with transition zone near the junction of the mid and distal thirds of the ileum. Terminal ileum appears normal. Suspect adhesion as most likely etiology; no obvious mass or extrinsic lesion appreciable on this study to account for the bowel obstruction. Electronically Signed   By: Lowella Grip III M.D.   On: 03-25-16 16:59    Anti-infectives: Anti-infectives    Start     Dose/Rate Route Frequency Ordered Stop   03/20/16 0600  cefoTEtan (CEFOTAN) 2 g in dextrose 5 % 50 mL IVPB     2 g 100 mL/hr over 30 Minutes Intravenous On call to O.R. 2016-03-25 1757 03/20/16 1035      Assessment/Plan: Clamp NGT DC foley Ambulate ? Clears in [place], MD, [facility]  03/21/2016

## 2016-03-21 NOTE — Progress Notes (Signed)
On- call provider called. Pts foley removed at noon. No urine output. Pt bladder scanned at 2000 with >489 noted. Dr. Adonis Huguenin ordered that pt had 30 minutes to void or foley to be placed and remain there through the night.

## 2016-03-21 NOTE — Anesthesia Postprocedure Evaluation (Signed)
Anesthesia Post Note  Patient: [name]  Procedure(s) Performed: Procedure(s) (LRB): EXPLORATORY LAPAROTOMY with lysis of adhesions (N/A)  Patient location during evaluation: PACU Anesthesia Type: General Level of consciousness: awake and alert and oriented Pain management: pain level controlled Vital Signs Assessment: post-procedure vital signs reviewed and stable Respiratory status: spontaneous breathing Cardiovascular status: blood pressure returned to baseline Anesthetic complications: no    Last Vitals:  Filed Vitals:   03/21/16 0531 03/21/16 0800  BP: 110/69   Pulse: 72   Temp: 36.3 C   Resp: 18 16    Last Pain:  Filed Vitals:   03/21/16 0810  PainSc: 0-No pain                 Keamber Macfadden

## 2016-03-21 NOTE — Progress Notes (Addendum)
Okay to advance pt to clear liquids per Dr. Dahlia Byes

## 2016-03-21 NOTE — Progress Notes (Signed)
No diet order placed per Dr. Dahlia Byes okay to place order for NPO

## 2016-03-22 ENCOUNTER — Encounter: Payer: Self-pay | Admitting: Surgery

## 2016-03-22 LAB — BASIC METABOLIC PANEL
ANION GAP: 3 — AB (ref 5–15)
BUN: 14 mg/dL (ref 6–20)
CHLORIDE: 106 mmol/L (ref 101–111)
CO2: 31 mmol/L (ref 22–32)
Calcium: 7.9 mg/dL — ABNORMAL LOW (ref 8.9–10.3)
Creatinine, Ser: 1.25 mg/dL — ABNORMAL HIGH (ref 0.61–1.24)
GFR calc Af Amer: >60 mL/min (ref >60)
GFR calc non Af Amer: 58 mL/min — ABNORMAL LOW (ref >60)
GLUCOSE: 101 mg/dL — AB (ref 65–99)
POTASSIUM: 3.2 mmol/L — AB (ref 3.5–5.1)
SODIUM: 140 mmol/L (ref 135–145)

## 2016-03-22 MED ORDER — KETOROLAC TROMETHAMINE 15 MG/ML IJ SOLN
15.0000 mg | Freq: Four times a day (QID) | INTRAMUSCULAR | Status: DC
  Administered 2016-03-22 – 2016-03-26 (×11): 15 mg via INTRAVENOUS
  Filled 2016-03-22 (×13): qty 1

## 2016-03-22 MED ORDER — MORPHINE SULFATE (PF) 4 MG/ML IV SOLN
4.0000 mg | INTRAVENOUS | Status: DC | PRN
  Administered 2016-03-22: 4 mg via INTRAVENOUS
  Filled 2016-03-22: qty 1

## 2016-03-22 MED ORDER — POTASSIUM CHLORIDE 20 MEQ PO PACK
20.0000 meq | PACK | Freq: Two times a day (BID) | ORAL | Status: DC
  Administered 2016-03-22 – 2016-03-25 (×7): 20 meq via ORAL
  Filled 2016-03-22 (×7): qty 1

## 2016-03-22 MED ORDER — OXYCODONE-ACETAMINOPHEN 5-325 MG PO TABS
1.0000 | ORAL_TABLET | ORAL | Status: DC | PRN
  Administered 2016-03-23: 2 via ORAL
  Filled 2016-03-22: qty 2

## 2016-03-22 MED ORDER — MORPHINE SULFATE (PF) 2 MG/ML IV SOLN: 2.0000 mg | INTRAVENOUS | Status: DC | PRN
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
## 2016-03-22 NOTE — Progress Notes (Addendum)
65 year old male POD#3 from ExLap LOA.  Patient is doing well NG tube removed yesterday. Patient has been tolerating clear liquids without any nausea or vomiting. Patient does endorse some increased fullness and some reflux as well. Patient has been passing flatus. Patient had some urinary retention yesterday after the Foley was removed and the Foley was put back into place. Patient has not been out of bed since his operation although he has been asking to get up. Patient sitting on edge of bed stand and walk over to a chair without any difficulty and minimal assist while I was in the room.  Filed Vitals:   03/22/16 0501 03/22/16 1401  BP: 118/72 132/78  Pulse: 77 83  Temp: 98.1 F (36.7 C) 98.6 F (37 C)  Resp: 20 17   I/O last 3 completed shifts: In: 5771.8 [P.O.:1740; I.V.:4031.8] Out: 2010 [Urine:1960; Emesis/NG output:50]     PE:  Gen: NAD Abd: soft, mildly distended, appropriately tender, incision clean and dry, no erythema Ext: no edema  CBC Latest Ref Rng 03/21/2016 03/20/2016 03/19/2016  WBC 3.8 - 10.6 K/uL 9.5 5.8 5.6  Hemoglobin 13.0 - 18.0 g/dL 12.8(L) 12.6(L) 12.0(L)  Hematocrit 40.0 - 52.0 % 38.5(L) 37.8(L) 35.8(L)  Platelets 150 - 440 K/uL 192 188 188   CMP Latest Ref Rng 03/22/2016 03/21/2016 03/20/2016  Glucose 65 - 99 mg/dL 101(H) 126(H) 100(H)  BUN 6 - 20 mg/dL 14 18 20   Creatinine 0.61 - 1.24 mg/dL 1.25(H) 1.36(H) 1.47(H)  Sodium 135 - 145 mmol/L 140 137 132(L)  Potassium 3.5 - 5.1 mmol/L 3.2(L) 4.2 3.4(L)  Chloride 101 - 111 mmol/L 106 105 100(L)  CO2 22 - 32 mmol/L 31 27 27   Calcium 8.9 - 10.3 mg/dL 7.9(L) 7.9(L) 7.8(L)  Total Protein 6.5 - 8.1 g/dL - - -  Total Bilirubin 0.3 - 1.2 mg/dL - - -  Alkaline Phos 38 - 126 U/L - - -  AST 15 - 41 U/L - - -  ALT 17 - 63 U/L - - -    A/p:  65 year old male POD#3 from ExLap, LOA for SBO.    Patient continues to be slowly improving. We'll continue clear liquid diet until patient is less distended. Added toradol, po prn  and continue IV for pain  Schizophrenia: continue home meds, haldol and ativan prn  Hypokalemia: replete with po potassium  Urinary retention: continue foley for today, will attempt fill and pull tomorrow AM. Now that moving around more may have better success

## 2016-03-22 NOTE — Care Management Important Message (Signed)
Important Message  Patient Details  Name: [name] MRN: [id number] Date of Birth: 09/30/1951   Medicare Important Message Given:  Yes    [name] 03/22/2016, 10:06 AM

## 2016-03-22 NOTE — Progress Notes (Signed)
Talked to Dr. Pat Patrick about medication for heartburn.  Doctor put in appropriate orders.  Christene Slates  03/22/2016  10:28 PM

## 2016-03-23 NOTE — Plan of Care (Signed)
Problem: Bowel/Gastric: Goal: Gastrointestinal status for postoperative course will improve Outcome: Progressing Patient is belching.  Problem: Health Behavior/Discharge Planning: Goal: Identification of resources available to assist in meeting health care needs will improve Outcome: Not Progressing Patient needs family assistance.

## 2016-03-23 NOTE — Progress Notes (Signed)
Scanned patient's bladder due to family's concern that he wasn't producing enough urine.  There was zero mL in bladder.  Encouraged patient to drink more water due to poor fluid intake.  Patient has fluids running @ 75 ml/hr.  Christene Slates  03/23/2016  7:19 AM

## 2016-03-23 NOTE — Progress Notes (Signed)
65 year old male POD#4 from ExLap LOA.  Patient did have some nausea and vomiting earlier today. Patient was still taking clears throughout the day though and has done well with that since that time. Patient had Foley removed as well and has been getting up to the bathroom to void. Patient has been him in the hallways. Patient has been passing some gas but only a little bit.  Filed Vitals:   03/23/16 1600 03/23/16 2015  BP: 93/69 111/66  Pulse: 104 88  Temp: 99.5 F (37.5 C) 98.5 F (36.9 C)  Resp: 22 20   I/O last 3 completed shifts: In: 4198.2 [P.O.:2068; I.V.:2130.2] Out: 900 [Urine:900]     PE:  Gen: NAD Abd: soft, mildly distended, appropriately tender, incision clean and dry, no erythema Ext: no edema  CBC Latest Ref Rng 03/21/2016 03/20/2016 03/19/2016  WBC 3.8 - 10.6 K/uL 9.5 5.8 5.6  Hemoglobin 13.0 - 18.0 g/dL 12.8(L) 12.6(L) 12.0(L)  Hematocrit 40.0 - 52.0 % 38.5(L) 37.8(L) 35.8(L)  Platelets 150 - 440 K/uL 192 188 188   CMP Latest Ref Rng 03/22/2016 03/21/2016 03/20/2016  Glucose 65 - 99 mg/dL 101(H) 126(H) 100(H)  BUN 6 - 20 mg/dL 14 18 20   Creatinine 0.61 - 1.24 mg/dL 1.25(H) 1.36(H) 1.47(H)  Sodium 135 - 145 mmol/L 140 137 132(L)  Potassium 3.5 - 5.1 mmol/L 3.2(L) 4.2 3.4(L)  Chloride 101 - 111 mmol/L 106 105 100(L)  CO2 22 - 32 mmol/L 31 27 27   Calcium 8.9 - 10.3 mg/dL 7.9(L) 7.9(L) 7.8(L)  Total Protein 6.5 - 8.1 g/dL - - -  Total Bilirubin 0.3 - 1.2 mg/dL - - -  Alkaline Phos 38 - 126 U/L - - -  AST 15 - 41 U/L - - -  ALT 17 - 63 U/L - - -    A/p:  65 year old male POD#4 from ExLap, LOA for SBO.    Patient continues to be slowly improving. We'll continue clear liquid diet until patient is less distended. Continue toradol and prn narcotic for pain  Schizophrenia: continue home meds, haldol and ativan prn  Hypokalemia: replete with po potassium, will recheck potassium tomorrow  Urinary retention: discontinued foley today, voiding well

## 2016-03-24 LAB — CBC
HCT: 32.6 % — ABNORMAL LOW (ref 40.0–52.0)
HEMOGLOBIN: 11.1 g/dL — AB (ref 13.0–18.0)
MCH: 29.2 pg (ref 26.0–34.0)
MCHC: 34.1 g/dL (ref 32.0–36.0)
MCV: 85.6 fL (ref 80.0–100.0)
Platelets: 259 10*3/uL (ref 150–440)
RBC: 3.81 MIL/uL — AB (ref 4.40–5.90)
RDW: 13.5 % (ref 11.5–14.5)
WBC: 11.3 10*3/uL — AB (ref 3.8–10.6)

## 2016-03-24 LAB — BASIC METABOLIC PANEL
Anion gap: 3 — ABNORMAL LOW (ref 5–15)
BUN: 14 mg/dL (ref 6–20)
CHLORIDE: 107 mmol/L (ref 101–111)
CO2: 28 mmol/L (ref 22–32)
Calcium: 7.9 mg/dL — ABNORMAL LOW (ref 8.9–10.3)
Creatinine, Ser: 1.38 mg/dL — ABNORMAL HIGH (ref 0.61–1.24)
GFR calc non Af Amer: 52 mL/min — ABNORMAL LOW (ref >60)
GFR, EST AFRICAN AMERICAN: 60 mL/min — AB (ref >60)
Glucose, Bld: 99 mg/dL (ref 65–99)
Potassium: 3.5 mmol/L (ref 3.5–5.1)
SODIUM: 138 mmol/L (ref 135–145)

## 2016-03-24 MED ORDER — PAROXETINE HCL 30 MG PO TABS
15.0000 mg | ORAL_TABLET | Freq: Every day | ORAL | Status: DC
  Filled 2016-03-24: qty 0.5

## 2016-03-24 MED ORDER — PROPRANOLOL HCL 10 MG PO TABS
10.0000 mg | ORAL_TABLET | ORAL | Status: DC
  Administered 2016-03-26: 10 mg via ORAL
  Filled 2016-03-24 (×4): qty 1

## 2016-03-24 MED ORDER — RISPERIDONE 1 MG PO TABS
4.0000 mg | ORAL_TABLET | Freq: Every day | ORAL | Status: DC
  Administered 2016-03-24 – 2016-03-25 (×2): 4 mg via ORAL
  Filled 2016-03-24 (×2): qty 4

## 2016-03-24 MED ORDER — PAROXETINE HCL 30 MG PO TABS
15.0000 mg | ORAL_TABLET | Freq: Every day | ORAL | Status: DC
  Administered 2016-03-24 – 2016-03-25 (×2): 15 mg via ORAL
  Filled 2016-03-24: qty 1
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
## 2016-03-24 NOTE — Care Management Important Message (Signed)
Important Message  Patient Details  Name: [name] MRN: [id number] Date of Birth: 19-Apr-1951   Medicare Important Message Given:  Yes    Juliann Pulse A Joyanne Eddinger 03/24/2016, 2:03 PM

## 2016-03-24 NOTE — Progress Notes (Signed)
65 year old male POD#5 from ExLap LOA.  Patient has been up and walking in hallway with family.  He states pain under control.  His sister who is POA here today with list of home meds, he has started some tremors and she wanted to make sure he is on his home meds.  Otherwise he has been passing flatus and one small BM.     Filed Vitals:   03/24/16 0813 03/24/16 1608  BP: 133/73 117/74  Pulse: 79 91  Temp: 98.9 F (37.2 C) 98.9 F (37.2 C)  Resp: 16 18   I/O last 3 completed shifts: In: [id number] [P.O.:1078; I.V.:680] Out: 1150 [Urine:1150] Total I/O In: [id number] [P.O.:240; I.V.:433] Out: -    PE:  Gen: NAD Abd: soft, moderately distended, appropriately tender, incision clean and dry, no erythema Ext: no edema  CBC Latest Ref Rng 03/24/2016 03/21/2016 03/20/2016  WBC 3.8 - 10.6 K/uL 11.3(H) 9.5 5.8  Hemoglobin 13.0 - 18.0 g/dL 11.1(L) 12.8(L) 12.6(L)  Hematocrit 40.0 - 52.0 % 32.6(L) 38.5(L) 37.8(L)  Platelets 150 - 440 K/uL 259 192 188   CMP Latest Ref Rng 03/24/2016 03/22/2016 03/21/2016  Glucose 65 - 99 mg/dL 99 101(H) 126(H)  BUN 6 - 20 mg/dL 14 14 18   Creatinine 0.61 - 1.24 mg/dL 1.38(H) 1.25(H) 1.36(H)  Sodium 135 - 145 mmol/L 138 140 137  Potassium 3.5 - 5.1 mmol/L 3.5 3.2(L) 4.2  Chloride 101 - 111 mmol/L 107 106 105  CO2 22 - 32 mmol/L 28 31 27   Calcium 8.9 - 10.3 mg/dL 7.9(L) 7.9(L) 7.9(L)  Total Protein 6.5 - 8.1 g/dL - - -  Total Bilirubin 0.3 - 1.2 mg/dL - - -  Alkaline Phos 38 - 126 U/L - - -  AST 15 - 41 U/L - - -  ALT 17 - 63 U/L - - -    A/p:  65 year old male POD#5 from ExLap, LOA for SBO.    Patient continues to be slowly improving. We'll continue clear liquid diet until patient is less distended. Continue toradol and prn narcotic for pain  Schizophrenia: added home Paxil and risperdal as well as continued klonopin and Zyprexa and haldol and ativan prns  Hypokalemia: potassium level improved with replacement  Urinary retention:voding well

## 2016-03-24 NOTE — Care Management (Signed)
Up in halls walking, pain controlled, tolerating clear liquid diet, passing gas and a small bowel movement.  Barrier- concern regarding continued abdominal distention. Will advance diet as distention subsides.

## 2016-03-25 MED ORDER — BISACODYL 10 MG RE SUPP
10.0000 mg | Freq: Every day | RECTAL | Status: DC
  Administered 2016-03-25 – 2016-03-26 (×2): 10 mg via RECTAL
  Filled 2016-03-25 (×2): qty 1

## 2016-03-25 MED ORDER — POLYETHYLENE GLYCOL 3350 17 G PO PACK
17.0000 g | PACK | Freq: Every day | ORAL | Status: DC
  Administered 2016-03-25 – 2016-03-26 (×2): 17 g via ORAL
  Filled 2016-03-25 (×2): qty 1

## 2016-03-25 NOTE — Progress Notes (Signed)
65 year old male POD#6 from ExLap LOA.  Patient has passed a good amount of gas today and had multiple bowel movements. Patient has also been up and walk in the hallway with family. Patient states that his pain is well controlled. Patient would like to try some regular food    Filed Vitals:   03/25/16 0425 03/25/16 1227  BP: 116/62 131/74  Pulse: 87 93  Temp: 99 F (37.2 C) 98.6 F (37 C)  Resp: 18 16   I/O last 3 completed shifts: In: 2603.4 [P.O.:1230; I.V.:1373.4] Out: 1600 [Urine:1600] Total I/O In: 2840.3 [P.O.:2100; I.V.:740.3] Out: 200 [Urine:200]   PE:  Gen: NAD Abd: soft, mildly distended but improved, appropriately tender, incision clean and dry, no erythema Ext: no edema  CBC Latest Ref Rng 03/24/2016 03/21/2016 03/20/2016  WBC 3.8 - 10.6 K/uL 11.3(H) 9.5 5.8  Hemoglobin 13.0 - 18.0 g/dL 11.1(L) 12.8(L) 12.6(L)  Hematocrit 40.0 - 52.0 % 32.6(L) 38.5(L) 37.8(L)  Platelets 150 - 440 K/uL 259 192 188   CMP Latest Ref Rng 03/24/2016 03/22/2016 03/21/2016  Glucose 65 - 99 mg/dL 99 101(H) 126(H)  BUN 6 - 20 mg/dL 14 14 18   Creatinine 0.61 - 1.24 mg/dL 1.38(H) 1.25(H) 1.36(H)  Sodium 135 - 145 mmol/L 138 140 137  Potassium 3.5 - 5.1 mmol/L 3.5 3.2(L) 4.2  Chloride 101 - 111 mmol/L 107 106 105  CO2 22 - 32 mmol/L 28 31 27   Calcium 8.9 - 10.3 mg/dL 7.9(L) 7.9(L) 7.9(L)  Total Protein 6.5 - 8.1 g/dL - - -  Total Bilirubin 0.3 - 1.2 mg/dL - - -  Alkaline Phos 38 - 126 U/L - - -  AST 15 - 41 U/L - - -  ALT 17 - 63 U/L - - -    A/p:  65 year old male POD#6 from ExLap, LOA for SBO.    Advance to Regular diet today  Schizophrenia: continue home psych meds with prns as need, appreciate psychiatry assistance  Hypokalemia: potassium level improved with replacement  Urinary retention: resolved voding well
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
## 2016-03-26 MED ORDER — POLYETHYLENE GLYCOL 3350 17 G PO PACK: 17.0000 g | PACK | Freq: Every day | ORAL | Status: DC

## 2016-03-26 MED ORDER — OXYCODONE-ACETAMINOPHEN 5-325 MG PO TABS: 1.0000 | ORAL_TABLET | ORAL | Status: DC | PRN

## 2016-03-26 NOTE — Care Management Important Message (Signed)
Important Message  Patient Details  Name: [name] MRN: [id number] Date of Birth: [birth year]   Medicare Important Message Given:  Yes    Juliann Pulse A Laquilla Dault 03/26/2016, 10:49 AM

## 2016-03-26 NOTE — Progress Notes (Signed)
03/26/2016  12:15 PM  [name] to be D/C'd Home per MD order.  Discussed prescriptions and follow up appointments with the patient. Prescriptions given to patient, medication list explained in detail. Pt verbalized understanding.    Medication List    TAKE these medications        clonazePAM 1 MG tablet  Commonly known as:  KLONOPIN  Take 1 tablet (1 mg total) by mouth at bedtime.     OLANZapine 15 MG tablet  Commonly known as:  ZYPREXA  Take 2 tablets (30 mg total) by mouth at bedtime.     oxyCODONE-acetaminophen 5-325 MG tablet  Commonly known as:  PERCOCET/ROXICET  Take 1-2 tablets by mouth every 4 (four) hours as needed for moderate pain or severe pain.     PARoxetine 30 MG tablet  Commonly known as:  PAXIL  Take 0.5 tablets (15 mg total) by mouth daily.     polyethylene glycol packet  Commonly known as:  MIRALAX / GLYCOLAX  Take 17 g by mouth daily.     propranolol 10 MG tablet  Commonly known as:  INDERAL  Take 1 tablet (10 mg total) by mouth every morning.     risperidone 4 MG tablet  Commonly known as:  RISPERDAL  Take 1 tablet (4 mg total) by mouth at bedtime.        Filed Vitals:   03/25/16 2153 03/26/16 0624  BP: 122/72 122/68  Pulse: 80 102  Temp: 98.9 F (37.2 C) 100.1 F (37.8 C)  Resp: 17 18    Skin clean, dry and intact without evidence of skin break down, no evidence of skin tears noted. IV catheter discontinued intact. Site without signs and symptoms of complications. Dressing and pressure applied. Pt denies pain at this time. No complaints noted.  An After Visit Summary was printed and given to the patient. Patient escorted via [place], and D/C home via private auto.  Dola Argyle

## 2016-03-26 NOTE — Discharge Summary (Signed)
Physician Discharge Summary  Patient ID: [name] MRN: [id number] DOB/AGE: Aug 14, 1951 65 y.o.  Admit date: 03/18/2016 Discharge date: 03/26/2016  Admission Diagnoses: Small bowel obstruction, Schizophrenia  Discharge Diagnoses:  Active Problems:   Schizophrenia, simple, chronic (H[facility])   SBO (small bowel obstruction) ([place])   Discharged Condition: good  Hospital Course: 65 yr old male with PMHx of schixophrenia and with SBO.  He underwent Exlap and LOA for SBO on 4/8 with Dr. Dahlia Byes.  Patient did have a postoperative ileus.  He now is tolerating a regular diet, having good BMs, up and walking in hallways and pain controlled with percocet.   Consults: psychiatry  Significant Diagnostic Studies: CT scan  Treatments: surgery:   Discharge Exam: Blood pressure 122/68, pulse 102, temperature 100.1 F (37.8 C), temperature source Oral, resp. rate 18, height 6' (1.829 m), weight 230 lb (104.327 kg), SpO2 93 %. General appearance: alert, cooperative and no distress GI: soft, mild distension, non-tender, incision c/d/i with staples in place, no erythema or drainage Extremities: extremities normal, atraumatic, no cyanosis or edema  Disposition: Final discharge disposition not confirmed  Discharge Instructions    Call MD for:  persistant nausea and vomiting    Complete by:  As directed      Call MD for:  redness, tenderness, or signs of infection (pain, swelling, redness, odor or green/yellow discharge around incision site)    Complete by:  As directed      Call MD for:  severe uncontrolled pain    Complete by:  As directed      Call MD for:  temperature >100.4    Complete by:  As directed      Diet - low sodium heart healthy    Complete by:  As directed      Driving Restrictions    Complete by:  As directed   No driving while on prescription pain medication     Increase activity slowly    Complete by:  As directed      Lifting restrictions    Complete by:  As directed   No  lifting more than 15lbs for 4weeks     May shower / Bathe    Complete by:  As directed      May walk up steps    Complete by:  As directed      No dressing needed    Complete by:  As directed             Medication List    TAKE these medications        clonazePAM 1 MG tablet  Commonly known as:  KLONOPIN  Take 1 tablet (1 mg total) by mouth at bedtime.     OLANZapine 15 MG tablet  Commonly known as:  ZYPREXA  Take 2 tablets (30 mg total) by mouth at bedtime.     oxyCODONE-acetaminophen 5-325 MG tablet  Commonly known as:  PERCOCET/ROXICET  Take 1-2 tablets by mouth every 4 (four) hours as needed for moderate pain or severe pain.     PARoxetine 30 MG tablet  Commonly known as:  PAXIL  Take 0.5 tablets (15 mg total) by mouth daily.     polyethylene glycol packet  Commonly known as:  MIRALAX / GLYCOLAX  Take 17 g by mouth daily.     propranolol 10 MG tablet  Commonly known as:  INDERAL  Take 1 tablet (10 mg total) by mouth every morning.     risperidone 4 MG tablet  Commonly known as:  RISPERDAL  Take 1 tablet (4 mg total) by mouth at bedtime.           Follow-up Information    Follow up with [facility]. Schedule an appointment as soon as possible for a visit in 10 days.   Why:  f/u Exlap LOA for SBO 4/8 Dr. Ulyses Amor information:   [place] [id number]      Signed: Hubbard Robinson 03/26/2016, 10:07 AM

## 2016-03-29 ENCOUNTER — Telehealth: Payer: Self-pay | Admitting: Surgery

## 2016-03-29 NOTE — Telephone Encounter (Signed)
Spoke with patient's sister at this time. Patient has not had a bowel movement since leaving hospital despite using Miralax 17g daily.   Informed patient that they should increase water intake and activity level as much as possible to help with bowel movement. Also educated that they may use Miralax over the counter x 2 doses, 6 hours apart, followed by Dulcolax x 2 doses, 6 hours apart until they have a successful bowel movement. If they are unsuccessful with oral medications, they will need to do 2 fleets enemas back to back. Asked patient to call back for further instructions if after taking all doses of these medications, they are still unsuccessful with a bowel movement.  Patient verbalizes understanding of this information.

## 2016-03-29 NOTE — Telephone Encounter (Signed)
Please call patients sister, [name], patient has not had a bowel movement since his surgery on 03/20/16. Please call and advise.

## 2016-03-31 ENCOUNTER — Emergency Department
Admission: EM | Admit: 2016-03-31 | Discharge: 2016-03-31 | Disposition: A | Discharge status: Home / Self Care | Payer: Medicare PPO | Attending: Emergency Medicine | Admitting: Emergency Medicine

## 2016-03-31 ENCOUNTER — Encounter: Payer: Self-pay | Admitting: Emergency Medicine

## 2016-03-31 ENCOUNTER — Emergency Department: Payer: Medicare PPO

## 2016-03-31 ENCOUNTER — Telehealth: Payer: Self-pay | Admitting: Surgery

## 2016-03-31 DIAGNOSIS — E785 Hyperlipidemia, unspecified: Secondary | ICD-10-CM | POA: Insufficient documentation

## 2016-03-31 DIAGNOSIS — Y69 Unspecified misadventure during surgical and medical care: Secondary | ICD-10-CM | POA: Diagnosis not present

## 2016-03-31 DIAGNOSIS — Z79891 Long term (current) use of opiate analgesic: Secondary | ICD-10-CM | POA: Diagnosis not present

## 2016-03-31 DIAGNOSIS — N182 Chronic kidney disease, stage 2 (mild): Secondary | ICD-10-CM | POA: Diagnosis not present

## 2016-03-31 DIAGNOSIS — Z79899 Other long term (current) drug therapy: Secondary | ICD-10-CM | POA: Diagnosis not present

## 2016-03-31 DIAGNOSIS — T8140XA Infection following a procedure, unspecified, initial encounter: Secondary | ICD-10-CM

## 2016-03-31 DIAGNOSIS — F2089 Other schizophrenia: Secondary | ICD-10-CM | POA: Diagnosis not present

## 2016-03-31 DIAGNOSIS — Z87891 Personal history of nicotine dependence: Secondary | ICD-10-CM | POA: Diagnosis not present

## 2016-03-31 DIAGNOSIS — T814XXA Infection following a procedure, initial encounter: Secondary | ICD-10-CM | POA: Diagnosis present

## 2016-03-31 LAB — COMPREHENSIVE METABOLIC PANEL
ALK PHOS: 172 U/L — AB (ref 38–126)
ALT: 53 U/L (ref 17–63)
ANION GAP: 9 (ref 5–15)
AST: 56 U/L — ABNORMAL HIGH (ref 15–41)
Albumin: 2.5 g/dL — ABNORMAL LOW (ref 3.5–5.0)
BILIRUBIN TOTAL: 1.4 mg/dL — AB (ref 0.3–1.2)
BUN: 16 mg/dL (ref 6–20)
CALCIUM: 8.5 mg/dL — AB (ref 8.9–10.3)
CO2: 25 mmol/L (ref 22–32)
CREATININE: 1.26 mg/dL — AB (ref 0.61–1.24)
Chloride: 104 mmol/L (ref 101–111)
GFR, EST NON AFRICAN AMERICAN: 58 mL/min — AB (ref >60)
Glucose, Bld: 110 mg/dL — ABNORMAL HIGH (ref 65–99)
Potassium: 4.4 mmol/L (ref 3.5–5.1)
SODIUM: 138 mmol/L (ref 135–145)
TOTAL PROTEIN: 6.7 g/dL (ref 6.5–8.1)

## 2016-03-31 LAB — CBC WITH DIFFERENTIAL/PLATELET
Basophils Absolute: 0.1 10*3/uL (ref 0–0.1)
Basophils Relative: 0 %
EOS ABS: 0.2 10*3/uL (ref 0–0.7)
Eosinophils Relative: 1 %
HEMATOCRIT: 33.8 % — AB (ref 40.0–52.0)
HEMOGLOBIN: 11 g/dL — AB (ref 13.0–18.0)
LYMPHS ABS: 1.3 10*3/uL (ref 1.0–3.6)
LYMPHS PCT: 7 %
MCH: 28.3 pg (ref 26.0–34.0)
MCHC: 32.7 g/dL (ref 32.0–36.0)
MCV: 86.6 fL (ref 80.0–100.0)
MONOS PCT: 9 %
Monocytes Absolute: 1.6 10*3/uL — ABNORMAL HIGH (ref 0.2–1.0)
NEUTROS PCT: 83 %
Neutro Abs: 15.6 10*3/uL — ABNORMAL HIGH (ref 1.4–6.5)
Platelets: 410 10*3/uL (ref 150–440)
RBC: 3.9 MIL/uL — ABNORMAL LOW (ref 4.40–5.90)
RDW: 14.2 % (ref 11.5–14.5)
WBC: 18.9 10*3/uL — ABNORMAL HIGH (ref 3.8–10.6)

## 2016-03-31 MED ORDER — VANCOMYCIN HCL IN DEXTROSE 1-5 GM/200ML-% IV SOLN
1000.0000 mg | Freq: Once | INTRAVENOUS | Status: AC
  Administered 2016-03-31: 1000 mg via INTRAVENOUS
  Filled 2016-03-31: qty 200

## 2016-03-31 MED ORDER — MUPIROCIN 2 % EX OINT: TOPICAL_OINTMENT | CUTANEOUS | Status: DC

## 2016-03-31 MED ORDER — CLINDAMYCIN HCL 300 MG PO CAPS: 300.0000 mg | ORAL_CAPSULE | Freq: Three times a day (TID) | ORAL | Status: DC

## 2016-03-31 NOTE — ED Notes (Signed)
MD at bedside. 

## 2016-03-31 NOTE — ED Notes (Signed)
Called lab for phlebotomy  

## 2016-03-31 NOTE — ED Notes (Signed)
States has abdominal surgery last week here, states lower incision line reddened. Midline abdominal staple line noted with redness around all staple sites but lower incision line reddened. Sister states he has had some drainage from lower incision line.

## 2016-03-31 NOTE — Telephone Encounter (Signed)
Patients sister, Osborne Casco, called concerned about patient. Patient had exploratory laparotomy on 03/20/16 with Dr Dahlia Byes. Ms [place] Crane states he is now running a fever, although she could not tell me what his temperature is. She said he is burning up and could feel with her hand. He has been having sweats and chills, His incision is red and appears to be infected, and his feet are swollen. I gave our office nurse this information and was instructed by the nurse to tell Ms Nevada Crane to take him to the emergency room. Ms [place] Crane said she would take him to the emergency room now.

## 2016-03-31 NOTE — Discharge Instructions (Signed)
Wound Infection °A wound infection happens when a type of germ (bacteria) starts growing in the wound. In some cases, this can cause the wound to break open. If cared for properly, the infected wound will heal from the inside to the outside. Wound infections need treatment. °CAUSES °An infection is caused by bacteria growing in the wound.  °SYMPTOMS  °· Increase in redness, swelling, or pain at the wound site. °· Increase in drainage at the wound site. °· Wound or bandage (dressing) starts to smell bad. °· Fever. °· Feeling tired or fatigued. °· Pus draining from the wound. °TREATMENT  °Your health care provider will prescribe antibiotic medicine. The wound infection should improve within 24 to 48 hours. Any redness around the wound should stop spreading and the wound should be less painful.  °HOME CARE INSTRUCTIONS  °· Only take over-the-counter or prescription medicines for pain, discomfort, or fever as directed by your health care provider. °· Take your antibiotics as directed. Finish them even if you start to feel better. °· Gently wash the area with mild soap and water 2 times a day, or as directed. Rinse off the soap. Pat the area dry with a clean towel. Do not rub the wound. This may cause bleeding. °· Follow your health care provider's instructions for how often you need to change the dressing. °· Apply ointment and a dressing to the wound as directed. °· If the dressing sticks, moisten it with soapy water and gently remove it. °· Change the bandage right away if it becomes wet, dirty, or develops a bad smell. °· Take showers. Do not take tub baths, swim, or do anything that may soak the wound until it is healed. °· Avoid exercises that make you sweat heavily. °· Use anti-itch medicine as directed by your health care provider. The wound may itch when it is healing. Do not pick or scratch at the wound. °· Follow up with your health care provider to get your wound rechecked as directed. °SEEK MEDICAL CARE  IF: °· You have an increase in swelling, pain, or redness around the wound. °· You have an increase in the amount of pus coming from the wound. °· There is a bad smell coming from the wound. °· More of the wound breaks open. °· You have a fever. °MAKE SURE YOU:  °· Understand these instructions. °· Will watch your condition. °· Will get help right away if you are not doing well or get worse. °  °This information is not intended to replace advice given to you by your health care provider. Make sure you discuss any questions you have with your health care provider. °  °Document Released: 08/28/2003 Document Revised: 12/04/2013 Document Reviewed: 05/19/2015 °Elsevier Interactive Patient Education ©2016 [employer] ° °

## 2016-03-31 NOTE — ED Provider Notes (Signed)
[facility] Emergency Department Provider Note     Time seen: ----------------------------------------- 11:26 AM on 03/31/2016 -----------------------------------------    I have reviewed the triage vital signs and the nursing notes.   HISTORY  Chief Complaint Post-op Problem    HPI [name] is a 65 y.o. male who presents to ER for a potential postoperative problem. Patient is exploratory laparotomy with lysis of adhesions resulting in an small bowel obstruction approximately 11 days ago. His surgeon was Dr. Dahlia Byes. Patient initially had had constipation problems but is now moving his bowels well after 2 enemas. Family caregiver states he had a fever last night, she is not sure how high. Staples are scheduled to be removed tomorrow.   Past Medical History  Diagnosis Date  . Chicken pox   . Measles   . Mumps   . Dizziness     Patient Active Problem List   Diagnosis Date Noted  . SBO (small bowel obstruction) ([place]) 03/18/2016  . Dizziness 05/16/2015  . Edema 05/15/2015  . Venous stasis dermatitis 05/15/2015  . Schizophrenia, simple, chronic ([place]) 08/21/2009  . Kidney Disease, Chronic stage II (mild) 08/20/2009  . Hyperlipemia 08/19/2009    Past Surgical History  Procedure Laterality Date  . None    . Laparotomy N/A 03/20/2016    Procedure: EXPLORATORY LAPAROTOMY with lysis of adhesions;  Surgeon: Jules Husbands, MD;  Location: [facility] ORS;  Service: General;  Laterality: N/A;    Allergies Review of patient's allergies indicates no known allergies.  Social History Social History  Substance Use Topics  . Smoking status: Former Smoker    Types: Cigarettes    Quit date: 08/04/2004  . Smokeless tobacco: Former Systems developer    Quit date: 08/04/2004  . Alcohol Use: No    Review of Systems Constitutional: Positive for fever Eyes: Negative for visual changes. ENT: Negative for sore throat. Cardiovascular: Negative for chest pain. Respiratory:  Negative for shortness of breath. Gastrointestinal: Negative for abdominal pain, vomiting and diarrhea. Genitourinary: Negative for dysuria. Musculoskeletal: Negative for back pain. Skin: Negative for rash. Neurological: Negative for headaches, focal weakness or numbness.  10-point ROS otherwise negative.  ____________________________________________   PHYSICAL EXAM:  VITAL SIGNS: ED Triage Vitals  Enc Vitals Group     BP 03/31/16 1059 116/75 mmHg     Pulse Rate 03/31/16 1059 95     Resp 03/31/16 1059 18     Temp 03/31/16 1059 98.2 F (36.8 C)     Temp Source 03/31/16 1059 Oral     SpO2 03/31/16 1059 96 %     Weight 03/31/16 1059 240 lb (108.863 kg)     Height 03/31/16 1059 6' (1.829 m)     Head Cir --      Peak Flow --      Pain Score --      Pain Loc --      Pain Edu? --      Excl. in [place]? --     Constitutional: Alert and oriented. Well appearing and in no distress. Eyes: Conjunctivae are normal. PERRL. Normal extraocular movements. ENT   Head: Normocephalic and atraumatic.   Nose: No congestion/rhinnorhea.   Mouth/Throat: Mucous membranes are moist.   Neck: No stridor. Cardiovascular: Normal rate, regular rhythm. No murmurs, rubs, or gallops. Respiratory: Normal respiratory effort without tachypnea nor retractions. Breath sounds are clear and equal bilaterally. No wheezes/rales/rhonchi. Gastrointestinal: Laparotomy incision with mild surrounding erythema particularly at each staple site. There is some erythema surrounding  the lower part of his incision. No focal tenderness, no purulence expressed from the wounds. There are bilateral lower abdominal contusions. Musculoskeletal: Nontender with normal range of motion in all extremities. No lower extremity tenderness nor edema. Neurologic:  Normal speech and language. No gross focal neurologic deficits are appreciated.  Skin:  Mild wound erythema, staples in place, lower abdominal contusions Psychiatric: Mood  and affect are normal. Speech and behavior are normal.  ____________________________________________  ED COURSE:  Pertinent labs & imaging results that were available during my care of the patient were reviewed by me and considered in my medical decision making (see chart for details). Patient is no acute distress, likely with mild wound infection. He will need staple removal. ____________________________________________    LABS (pertinent positives/negatives)  Labs Reviewed  CBC WITH DIFFERENTIAL/PLATELET - Abnormal; Notable for the following:    WBC 18.9 (*)    RBC 3.90 (*)    Hemoglobin 11.0 (*)    HCT 33.8 (*)    Neutro Abs 15.6 (*)    Monocytes Absolute 1.6 (*)    All other components within normal limits  COMPREHENSIVE METABOLIC PANEL - Abnormal; Notable for the following:    Glucose, Bld 110 (*)    Creatinine, Ser 1.26 (*)    Calcium 8.5 (*)    Albumin 2.5 (*)    AST 56 (*)    Alkaline Phosphatase 172 (*)    Total Bilirubin 1.4 (*)    GFR calc non Af Amer 58 (*)    All other components within normal limits  CULTURE, BLOOD (ROUTINE X 2)  CULTURE, BLOOD (ROUTINE X 2)    RADIOLOGY Images were viewed by me  Abdomen 2 view IMPRESSION: No bowel dilatation or air-fluid level. Contrast is seen throughout the large bowel and appendix. No free air. ____________________________________________  FINAL ASSESSMENT AND PLAN  Mild postoperative infection  Plan: Patient with labs and imaging as dictated above. Patient is in no acute distress, has a surprisingly high white count considering a relatively benign exam. His abdomen remained soft, remove staples after discussion with Dr. Adonis Huguenin. He has received a dose of IV vancomycin, he'll be discharged with clindamycin and will see general surgery for follow-up tomorrow.   Earleen Newport, MD   Earleen Newport, MD 03/31/16 [id number]

## 2016-04-01 ENCOUNTER — Ambulatory Visit (INDEPENDENT_AMBULATORY_CARE_PROVIDER_SITE_OTHER): Payer: Medicare Other | Admitting: General Surgery

## 2016-04-01 ENCOUNTER — Encounter: Payer: Self-pay | Admitting: General Surgery

## 2016-04-01 VITALS — BP 109/69 | HR 105 | Temp 99.4°F

## 2016-04-01 DIAGNOSIS — Z4889 Encounter for other specified surgical aftercare: Secondary | ICD-10-CM

## 2016-04-01 MED ORDER — MUPIROCIN 2 % EX OINT: TOPICAL_OINTMENT | CUTANEOUS | Status: DC

## 2016-04-01 NOTE — Patient Instructions (Signed)
At this time you are able to take a shower. After you take a shower, I want you to apply some Bactroban ointment and then put a bandage on top. Apply the ointment twice a day.  If you develop a fever, please give [place] a call.  We want to see you next week to check on your wound.

## 2016-04-01 NOTE — Progress Notes (Signed)
Outpatient Surgical Follow Up  04/01/2016  [name] is an 65 y.o. male.   Chief Complaint  Patient presents with  . Routine Post Op    Exploratory Laparotomy 03/20/2016 Dr. Dahlia Byes    HPI: 65 year old male returns to clinic for follow-up from recent exploratory laparotomy. Patient reports that over the weekend he had fevers and chills and was seen in the emergency department earlier this week. He was diagnosed as having a superficial infection of his incision site and started on antibiotics. Since that time he denies having any fevers at home. He has been tolerating a diet without any nausea or vomiting. He's been having some bowel function but has been requiring stimulation. He denies any chest pain or shortness of breath and otherwise is feeling very well since surgery. He's been very happy with his surgical services.  Past Medical History  Diagnosis Date  . Chicken pox   . Measles   . Mumps   . Dizziness     Past Surgical History  Procedure Laterality Date  . None    . Laparotomy N/A 03/20/2016    Procedure: EXPLORATORY LAPAROTOMY with lysis of adhesions;  Surgeon: Jules Husbands, MD;  Location: [facility] ORS;  Service: General;  Laterality: N/A;    Family History  Problem Relation Age of Onset  . Diabetes Mother   . Heart disease Mother   . Stroke Mother   . Diabetes Father   . Hypertension Father   . Lung cancer Father   . Cancer Father     lung cancer  . Schizophrenia Sister   . Lung cancer Sister   . Colon cancer Sister     Social History:  reports that he quit smoking about 11 years ago. His smoking use included Cigarettes. He quit smokeless tobacco use about 11 years ago. He reports that he does not drink alcohol or use illicit drugs.  Allergies: No Known Allergies  Medications reviewed.    ROS A multipoint review of systems was completed. All pertinent positives and negatives were documented within the history of present illness and remainder are  negative.   BP 109/69 mmHg  Pulse 105  Temp(Src) 99.4 F (37.4 C) (Oral)  Physical Exam Gen.: No acute distress Chest: Clear to auscultation Heart: Tachycardic Abdomen is soft, appropriate tender to palpation along the midline incision, nondistended. There is a small area of expressible drainage on the most caudad aspect of the incision. There is minimal hyperemia but no spreading erythema. The staples have all been removed and there are Steri-Strips in place.    Results for orders placed or performed during the hospital encounter of 03/31/16 (from the past 48 hour(s))  CBC with Differential     Status: Abnormal   Collection Time: 03/31/16 11:05 AM  Result Value Ref Range   WBC 18.9 (H) 3.8 - 10.6 K/uL   RBC 3.90 (L) 4.40 - 5.90 MIL/uL   Hemoglobin 11.0 (L) 13.0 - 18.0 g/dL   HCT 33.8 (L) 40.0 - 52.0 %   MCV 86.6 80.0 - 100.0 fL   MCH 28.3 26.0 - 34.0 pg   MCHC 32.7 32.0 - 36.0 g/dL   RDW 14.2 11.5 - 14.5 %   Platelets 410 150 - 440 K/uL   Neutrophils Relative % 83 %   Neutro Abs 15.6 (H) 1.4 - 6.5 K/uL   Lymphocytes Relative 7 %   Lymphs Abs 1.3 1.0 - 3.6 K/uL   Monocytes Relative 9 %   Monocytes Absolute 1.6 (  H) 0.2 - 1.0 K/uL   Eosinophils Relative 1 %   Eosinophils Absolute 0.2 0 - 0.7 K/uL   Basophils Relative 0 %   Basophils Absolute 0.1 0 - 0.1 K/uL  Comprehensive metabolic panel     Status: Abnormal   Collection Time: 03/31/16 11:05 AM  Result Value Ref Range   Sodium 138 135 - 145 mmol/L   Potassium 4.4 3.5 - 5.1 mmol/L    Comment: HEMOLYSIS AT THIS LEVEL MAY AFFECT RESULT   Chloride 104 101 - 111 mmol/L   CO2 25 22 - 32 mmol/L   Glucose, Bld 110 (H) 65 - 99 mg/dL   BUN 16 6 - 20 mg/dL   Creatinine, Ser 1.26 (H) 0.61 - 1.24 mg/dL   Calcium 8.5 (L) 8.9 - 10.3 mg/dL   Total Protein 6.7 6.5 - 8.1 g/dL   Albumin 2.5 (L) 3.5 - 5.0 g/dL   AST 56 (H) 15 - 41 U/L   ALT 53 17 - 63 U/L   Alkaline Phosphatase 172 (H) 38 - 126 U/L   Total Bilirubin 1.4 (H) 0.3 -  1.2 mg/dL   GFR calc non Af Amer 58 (L) >60 mL/min   GFR calc Af Amer >60 >60 mL/min    Comment: (NOTE) The eGFR has been calculated using the CKD EPI equation. This calculation has not been validated in all clinical situations. eGFR's persistently <60 mL/min signify possible Chronic Kidney Disease.    Anion gap 9 5 - 15   Dg Abd 2 Views  03/31/2016  CLINICAL DATA:  Progressive abdominal pain with nausea.  Fever. EXAM: ABDOMEN - 2 VIEW COMPARISON:  Small bowel series March 19, 2016 FINDINGS: Supine and upright images were obtained. There is contrast throughout the colon. Appendix is seen to fill. There is no bowel dilatation or air-fluid level suggesting obstruction. No demonstrable free air. Lung bases are clear. Skin staples remain in the midline over the lower abdomen and pelvis. IMPRESSION: No bowel dilatation or air-fluid level. Contrast is seen throughout the large bowel and appendix. No free air. Electronically Signed   By: Lowella Grip III M.D.   On: 03/31/2016 12:21    Assessment/Plan:  1. Aftercare following surgery 65 year old male with a superficial infection from a prior laparotomy incision site. Currently on antibiotics. No evidence of spreading erythema or need for wound reopened. Discussed with the patient and his wife the signs and symptoms of worsening infection and provided return instructions should this worsen. Otherwise discussed appropriate wound care and for him to continue his antibiotics. He'll follow up in clinic next week for additional wound check with his operative surgeon.     Clayburn Pert, MD FACS General Surgeon  04/01/2016,8:49 AM

## 2016-04-05 ENCOUNTER — Encounter: Payer: Self-pay | Admitting: Surgery

## 2016-04-05 ENCOUNTER — Telehealth: Payer: Self-pay | Admitting: Surgery

## 2016-04-05 ENCOUNTER — Ambulatory Visit (INDEPENDENT_AMBULATORY_CARE_PROVIDER_SITE_OTHER): Payer: Medicare Other | Admitting: Surgery

## 2016-04-05 VITALS — BP 108/68 | HR 72 | Temp 98.5°F | Ht 72.0 in | Wt 232.0 lb

## 2016-04-05 DIAGNOSIS — Z09 Encounter for follow-up examination after completed treatment for conditions other than malignant neoplasm: Secondary | ICD-10-CM

## 2016-04-05 NOTE — Progress Notes (Signed)
Mr. Oravec is status post laparotomy on April 8  small bowel obstruction secondary to adhesions. He developed superficial infection that was treated with antibiotics alone Now a taking for wound check. He is being having daily bowel movements he is taking by mouth and reports no pain AVSS  PE NAD, walks into the office Abd: incision helaing well, no erytema, infection. There is a 1 cm area very superficial that has dehisced but is healing very well  A/P doing well Continue wound care RTC 2-3 weeks

## 2016-04-05 NOTE — Telephone Encounter (Signed)
Patient had EXPLORATORY LAPAROTOMY  On 4/8 with Dr Dahlia Byes. Patient sister called with concerns. Patient bent over and the incision busted open at the top towards his chest. Appointment was made for patient to come in to see Dr Dahlia Byes in [facility] this morning at 10:15am.

## 2016-04-05 NOTE — Patient Instructions (Signed)
Please follow up in our office in 2 weeks.

## 2016-04-08 ENCOUNTER — Encounter: Payer: Medicare Other | Admitting: Surgery

## 2016-04-10 LAB — CULTURE, BLOOD (ROUTINE X 2)
CULTURE: NO GROWTH
Culture: NO GROWTH

## 2016-04-27 ENCOUNTER — Ambulatory Visit: Payer: Self-pay | Admitting: Psychiatry

## 2016-04-28 ENCOUNTER — Ambulatory Visit: Payer: Self-pay | Admitting: Surgery

## 2016-04-30 ENCOUNTER — Ambulatory Visit: Payer: Self-pay | Admitting: Surgery

## 2016-05-04 ENCOUNTER — Ambulatory Visit (INDEPENDENT_AMBULATORY_CARE_PROVIDER_SITE_OTHER): Payer: Medicare PPO | Admitting: Family Medicine

## 2016-05-04 ENCOUNTER — Other Ambulatory Visit
Admission: RE | Admit: 2016-05-04 | Discharge: 2016-05-04 | Disposition: A | Discharge status: Home / Self Care | Payer: Medicare PPO | Source: Ambulatory Visit | Attending: Family Medicine | Admitting: Family Medicine

## 2016-05-04 ENCOUNTER — Ambulatory Visit (INDEPENDENT_AMBULATORY_CARE_PROVIDER_SITE_OTHER): Payer: Medicare PPO | Admitting: Psychiatry

## 2016-05-04 ENCOUNTER — Encounter: Payer: Self-pay | Admitting: Family Medicine

## 2016-05-04 ENCOUNTER — Encounter: Payer: Self-pay | Admitting: Psychiatry

## 2016-05-04 ENCOUNTER — Telehealth: Payer: Self-pay | Admitting: *Deleted

## 2016-05-04 ENCOUNTER — Other Ambulatory Visit: Payer: Self-pay | Admitting: *Deleted

## 2016-05-04 VITALS — BP 90/52 | HR 83 | Temp 98.1°F | Resp 16 | Ht 71.0 in | Wt 228.0 lb

## 2016-05-04 VITALS — BP 88/58 | HR 79 | Temp 97.1°F | Ht 72.0 in | Wt 230.4 lb

## 2016-05-04 DIAGNOSIS — R42 Dizziness and giddiness: Secondary | ICD-10-CM

## 2016-05-04 DIAGNOSIS — F200 Paranoid schizophrenia: Secondary | ICD-10-CM | POA: Diagnosis not present

## 2016-05-04 DIAGNOSIS — D649 Anemia, unspecified: Secondary | ICD-10-CM

## 2016-05-04 DIAGNOSIS — I950 Idiopathic hypotension: Secondary | ICD-10-CM | POA: Insufficient documentation

## 2016-05-04 DIAGNOSIS — I959 Hypotension, unspecified: Secondary | ICD-10-CM | POA: Insufficient documentation

## 2016-05-04 LAB — CBC
HCT: 31.5 % — ABNORMAL LOW (ref 40.0–52.0)
HEMOGLOBIN: 10.4 g/dL — AB (ref 13.0–18.0)
MCH: 27.3 pg (ref 26.0–34.0)
MCHC: 32.9 g/dL (ref 32.0–36.0)
MCV: 82.9 fL (ref 80.0–100.0)
PLATELETS: 326 10*3/uL (ref 150–440)
RBC: 3.8 MIL/uL — ABNORMAL LOW (ref 4.40–5.90)
RDW: 14.5 % (ref 11.5–14.5)
WBC: 11.1 10*3/uL — AB (ref 3.8–10.6)

## 2016-05-04 LAB — COMPREHENSIVE METABOLIC PANEL
ALK PHOS: 110 U/L (ref 38–126)
ALT: 38 U/L (ref 17–63)
AST: 37 U/L (ref 15–41)
Albumin: 3.1 g/dL — ABNORMAL LOW (ref 3.5–5.0)
Anion gap: 7 (ref 5–15)
BUN: 19 mg/dL (ref 6–20)
CALCIUM: 9 mg/dL (ref 8.9–10.3)
CHLORIDE: 105 mmol/L (ref 101–111)
CO2: 27 mmol/L (ref 22–32)
CREATININE: 1.54 mg/dL — AB (ref 0.61–1.24)
GFR calc non Af Amer: 45 mL/min — ABNORMAL LOW (ref >60)
GFR, EST AFRICAN AMERICAN: 53 mL/min — AB (ref >60)
GLUCOSE: 99 mg/dL (ref 65–99)
Potassium: 4.2 mmol/L (ref 3.5–5.1)
SODIUM: 139 mmol/L (ref 135–145)
Total Bilirubin: 0.5 mg/dL (ref 0.3–1.2)
Total Protein: 7.6 g/dL (ref 6.5–8.1)

## 2016-05-04 LAB — TSH: TSH: 1.333 u[IU]/mL (ref 0.350–4.500)

## 2016-05-04 MED ORDER — PAROXETINE HCL 30 MG PO TABS: 15.0000 mg | ORAL_TABLET | Freq: Every day | ORAL | Status: DC

## 2016-05-04 MED ORDER — POLYETHYLENE GLYCOL 3350 17 G PO PACK: 17.0000 g | PACK | Freq: Every day | ORAL | Status: DC

## 2016-05-04 MED ORDER — RISPERIDONE 4 MG PO TABS: 4.0000 mg | ORAL_TABLET | Freq: Every day | ORAL | Status: DC

## 2016-05-04 MED ORDER — CLONAZEPAM 1 MG PO TABS: 1.0000 mg | ORAL_TABLET | Freq: Every day | ORAL | Status: DC

## 2016-05-04 MED ORDER — OLANZAPINE 15 MG PO TABS: 30.0000 mg | ORAL_TABLET | Freq: Every day | ORAL | Status: DC

## 2016-05-04 NOTE — Progress Notes (Signed)
Patient ID: [name], male   DOB: 25-May-1951, 65 y.o.   MRN: [id number]  [facility] MD/PA/NP OP Progress Note  05/04/2016 12:55 PM [name]  MRN:  [id number]  Subjective:  Patient is a 65 year old white male with a history of schizophrenia.  Patient is presents today with his sister for a follow-up appointment. Patient had surgery due to intestinal obstruction. Patient states that he has recovered well after his surgery but the is not eating as before. Custody with patient and his sister that he will slowly regain his appetite as his recovers fully. Patient sister reports that patient has been dizzy and his blood pressure has been running low. They saw Dr. Caryn Section this morning and his running some blood test to figure out the reason for his low blood pressure. Mood wise patient has been doing well and denies any problems. Denies hearing voices or seeing things. Denies any suicidal ideations.  Per Dr.williams note,"  I did discuss with them as I have in the past that it could be ineffective him being on 2 antipsychotic medications. Both patient and sister want to stay on the same regimen as he has decompensated in the past when there have been changes to it."   Chief Complaint:  Chief Complaint    Follow-up; Medication Refill     Doing well Visit Diagnosis:     ICD-9-CM ICD-10-CM   1. Paranoid schizophrenia ([place]) 295.30 F20.0     Past Medical History:  Past Medical History  Diagnosis Date  . Chicken pox   . Measles   . Mumps   . Dizziness     Past Surgical History  Procedure Laterality Date  . None    . Laparotomy N/A 03/20/2016    Procedure: EXPLORATORY LAPAROTOMY with lysis of adhesions;  Surgeon: Jules Husbands, MD;  Location: [facility] ORS;  Service: General;  Laterality: N/A;  . Colon surgery     Family History:  Family History  Problem Relation Age of Onset  . Diabetes Mother   . Heart disease Mother   . Stroke Mother   . Diabetes Father   . Hypertension Father   . Lung  cancer Father   . Cancer Father     lung cancer  . Schizophrenia Sister   . Lung cancer Sister   . Colon cancer Sister    Social History:  Social History   Social History  . Marital Status: Single    Spouse Name: N/A  . Number of Children: 2  . Years of Education: N/A   Occupational History  . Disabled    Social History Main Topics  . Smoking status: Former Smoker    Types: Cigarettes    Quit date: 08/04/2004  . Smokeless tobacco: Former Systems developer    Quit date: 08/04/2004  . Alcohol Use: No  . Drug Use: No  . Sexual Activity: Not Currently   Other Topics Concern  . None   Social History Narrative   Additional History:   Assessment:   Musculoskeletal: Strength & Muscle Tone: within normal limits Gait & Station: normal Patient leans: N/A  Psychiatric Specialty Exam: HPI  Review of Systems  Psychiatric/Behavioral: Negative for depression, suicidal ideas, hallucinations, memory loss and substance abuse. The patient is not nervous/anxious and does not have insomnia.   All other systems reviewed and are negative.   Blood pressure 88/58, pulse 79, temperature 97.1 F (36.2 C), temperature source Tympanic, height 6' (1.829 m), weight 230 lb 6.4 oz (104.509 kg), SpO2  95 %.Body mass index is 31.24 kg/(m^2).  General Appearance: Neat and Well Groomed  Eye Contact:  Good  Speech:  Normal Rate  Volume:  Normal  Mood:  Good   Affect:  Appropriate and Congruent  Thought Process:  Linear and at times concrete  Orientation:  Full (Time, Place, and Person)  Thought Content:  Negative  Suicidal Thoughts:  No  Homicidal Thoughts:  No  Memory:  Immediate;   Good Recent;   Good Remote;   Good  Judgement:  Good  Insight:  Good  Psychomotor Activity:  Negative  Concentration:  Good  Recall:  Good  Fund of Knowledge: Fair  Language: Fair  Akathisia:  Negative  Handed:  Right unknown   AIMS (if indicated):  On 02/03/2016, normal, no rigidity or cog wheeling  Assets:   Desire for Improvement Social Support  ADL's:  Intact  Cognition: WNL  Sleep:  good   Is the patient at risk to self?  No. Has the patient been a risk to self in the past 6 months?  No. Has the patient been a risk to self within the distant past?  No. Is the patient a risk to others?  No. Has the patient been a risk to others in the past 6 months?  No. Has the patient been a risk to others within the distant past?  No.  Current Medications: Current Outpatient Prescriptions  Medication Sig Dispense Refill  . clonazePAM (KLONOPIN) 1 MG tablet Take 1 tablet (1 mg total) by mouth at bedtime. 30 tablet 3  . OLANZapine (ZYPREXA) 15 MG tablet Take 2 tablets (30 mg total) by mouth at bedtime. 60 tablet 3  . PARoxetine (PAXIL) 30 MG tablet Take 0.5 tablets (15 mg total) by mouth daily. 15 tablet 3  . polyethylene glycol (MIRALAX / GLYCOLAX) packet Take 17 g by mouth daily. 14 each 3  . risperidone (RISPERDAL) 4 MG tablet Take 1 tablet (4 mg total) by mouth at bedtime. 30 tablet 3   No current facility-administered medications for this visit.    Medical Decision Making:  Established Problem, Stable/Improving (1), Review of Medication Regimen & Side Effects (2) and Review of New Medication or Change in Dosage (2)  Treatment Plan Summary:Medication management and Plan   Schizophrenia-stable. We will continue his olanzapine 30 mg daily, his risperidone 4 mg, at bedtime, paroxetine 15 mg daily, clonazepam 1 mg at bedtime. Patient and his sister were educated about the incidence of side effects and long-term movement disorders being high with combination of Zyprexa and Risperdal. Also discussed metabolic disturbances with the combination of these medications. However they stated that patient has needed this combination to be able to function. Today we also discussed that both Risperdal and Zyprexa maybe contributing to his decreased blood pressure and dizziness. However patient and his sister report  that this off Dr. Caryn Section at this morning and would like to wait on his blood test results and his recommendations.  Patient will follow up in 3 months. They've been encouraged to call with any questions or concerns prior to the next appointment.   [name] 05/04/2016, 12:55 PM

## 2016-05-04 NOTE — Progress Notes (Signed)
Patient: [name] Male    DOB: Jul 18, 1951   65 y.o.   MRN: [id number] Visit Date: 05/04/2016  Today's Provider: Lelon Huh, MD   Chief Complaint  Patient presents with  . Dizziness   Subjective:    Dizziness This is a recurrent problem. The current episode started more than 1 month ago (over 1 month). The problem occurs intermittently. The problem has been gradually worsening. Associated symptoms include a change in bowel habit, fatigue and swollen glands. Pertinent negatives include no abdominal pain, anorexia, arthralgias, chest pain, chills, congestion, coughing, diaphoresis, fever, headaches, joint swelling, myalgias, nausea, neck pain, numbness, rash, sore throat, urinary symptoms, vertigo, visual change, vomiting or weakness. The symptoms are aggravated by standing, twisting, walking and bending. He has tried nothing for the symptoms.   He had similar symptoms last year, which resolved after his psychiatrist discontinue Inderal. He states he no longer takes it. Pharmacy dispense records indicate it was last dispensed with 30 days supply in February 2017.   Patient was seen in [facility] 03/18/2016 for dizziness. Patient was admitted to hospital for bowel obstruction. Patient had surgery for laparotomy to remove bowel obstruction. Patient has had dizziness since hospital visit. Usually dizziness is in the mornings, occasionally when he moves a certain way.     No Known Allergies Previous Medications   CLONAZEPAM (KLONOPIN) 1 MG TABLET    Take 1 tablet (1 mg total) by mouth at bedtime.   OLANZAPINE (ZYPREXA) 15 MG TABLET    Take 2 tablets (30 mg total) by mouth at bedtime.   PAROXETINE (PAXIL) 30 MG TABLET    Take 0.5 tablets (15 mg total) by mouth daily.   POLYETHYLENE GLYCOL (MIRALAX / GLYCOLAX) PACKET    Take 17 g by mouth daily.   RISPERIDONE (RISPERDAL) 4 MG TABLET    Take 1 tablet (4 mg total) by mouth at bedtime.    Review of Systems  Constitutional: Positive  for fatigue. Negative for fever, chills, diaphoresis and appetite change.  HENT: Negative for congestion and sore throat.   Respiratory: Negative for cough, chest tightness, shortness of breath and wheezing.   Cardiovascular: Negative for chest pain and palpitations.  Gastrointestinal: Positive for change in bowel habit. Negative for nausea, vomiting, abdominal pain and anorexia.  Musculoskeletal: Negative for myalgias, joint swelling, arthralgias and neck pain.  Skin: Negative for rash.  Neurological: Positive for dizziness. Negative for vertigo, weakness, numbness and headaches.    Social History  Substance Use Topics  . Smoking status: Former Smoker    Types: Cigarettes    Quit date: 08/04/2004  . Smokeless tobacco: Former Systems developer    Quit date: 08/04/2004  . Alcohol Use: No   Objective:   BP 90/52 mmHg  Pulse 83  Temp(Src) 98.1 F (36.7 C) (Oral)  Resp 16  Ht 5\' 11"  (1.803 m)  Wt 228 lb (103.42 kg)  BMI 31.81 kg/m2  SpO2 96%  Physical Exam   General Appearance:    Alert, cooperative, no distress  Eyes:    PERRL, conjunctiva/corneas clear, EOM's intact       Lungs:     Clear to auscultation bilaterally, respirations unlabored  Heart:    Regular rate and rhythm  Neurologic:   Awake, alert, oriented x 3. No apparent focal neurological           defect.           Assessment & Plan:     1. Dizziness  Secondary to hypotensive.   2. Idiopathic hypotension Check labs. He states he is no longer taking Inderal. Hypotensive is known side effect of olanzapine, however he has not had any recent dosing adjustments. May be aggravated by poor PO intake since having abdominal surgery in April.  - CBC - Comprehensive metabolic panel - TSH       Lelon Huh, MD  [facility]

## 2016-05-06 ENCOUNTER — Telehealth: Payer: Self-pay | Admitting: Family Medicine

## 2016-05-06 NOTE — Telephone Encounter (Signed)
Patient called back with his sister [name]. Patient verbally gave me approval to speak with his sister. [name] wanted to know if the add on lab results had come back. I checked patients chart and results are not back yet. [name] was advised of this. She asked what they could do for his dizziness. I re advised her of the lab results and Dr. Maralyn Sago recommendations. She verbally voiced understanding and states she would do what he recommends. I advised [name] that she could check back tomorrow to see if results are in.

## 2016-05-06 NOTE — Telephone Encounter (Signed)
Pt's sister Osborne Casco called stating that pt is still having a lot of dizziness.She would like to get results of recent lab results.Call back [phone]

## 2016-05-11 ENCOUNTER — Telehealth: Payer: Self-pay | Admitting: Family Medicine

## 2016-05-11 DIAGNOSIS — D649 Anemia, unspecified: Secondary | ICD-10-CM

## 2016-05-11 DIAGNOSIS — R42 Dizziness and giddiness: Secondary | ICD-10-CM

## 2016-05-11 NOTE — Telephone Encounter (Signed)
Patient's sister [name] was notified. Expressed understanding.

## 2016-05-11 NOTE — Telephone Encounter (Signed)
Sister called back his BP 90/52  And pulse 86,  Sister says blood pressure will go up but not stay up.  ThanksTeri

## 2016-05-11 NOTE — Telephone Encounter (Signed)
error 

## 2016-05-11 NOTE — Telephone Encounter (Signed)
[place] lab stated that the tests were canceled by epic when pt was released from hospital. Therefore the test did not get done and blood samples that they had are expired.

## 2016-05-11 NOTE — Telephone Encounter (Signed)
Please advise results for labs add-on?

## 2016-05-11 NOTE — Telephone Encounter (Signed)
Please call  [facility] lab and see if they were able to add Ferritin, iron and IBC They said they were going to but they never sent results.

## 2016-05-11 NOTE — Telephone Encounter (Signed)
Patient needs to have blood drawn, please leave printed order at front desk. Also recommend reducing dose of olanzapine to one tablet at bedtime in instead of two as this medication is known to cause low blood pressure. Follow up in 1 week.

## 2016-05-11 NOTE — Telephone Encounter (Signed)
Pt sister [name] called to get lab results.  CB#[phone]/MW

## 2016-05-13 LAB — VITAMIN B12: VITAMIN B 12: 487 pg/mL (ref 211–946)

## 2016-05-13 LAB — IRON AND TIBC
IRON SATURATION: 9 % — AB (ref 15–55)
IRON: 15 ug/dL — AB (ref 38–169)
Total Iron Binding Capacity: 176 ug/dL — ABNORMAL LOW (ref 250–450)
UIBC: 161 ug/dL (ref 111–343)

## 2016-05-13 LAB — FERRITIN: FERRITIN: 245 ng/mL (ref 30–400)

## 2016-05-13 LAB — FOLATE: Folate: 18.9 ng/mL (ref >3.0)

## 2016-05-13 LAB — RETICULOCYTES: RETIC CT PCT: 1.4 % (ref 0.6–2.6)

## 2016-05-14 ENCOUNTER — Encounter: Payer: Self-pay | Admitting: Family Medicine

## 2016-05-14 ENCOUNTER — Ambulatory Visit (INDEPENDENT_AMBULATORY_CARE_PROVIDER_SITE_OTHER): Payer: Medicare PPO | Admitting: Family Medicine

## 2016-05-14 ENCOUNTER — Ambulatory Visit
Admission: RE | Admit: 2016-05-14 | Discharge: 2016-05-14 | Disposition: A | Discharge status: Home / Self Care | Payer: Medicare PPO | Source: Ambulatory Visit | Attending: Family Medicine | Admitting: Family Medicine

## 2016-05-14 VITALS — BP 94/62 | HR 92 | Temp 98.5°F | Resp 16 | Wt 222.0 lb

## 2016-05-14 DIAGNOSIS — R112 Nausea with vomiting, unspecified: Secondary | ICD-10-CM

## 2016-05-14 DIAGNOSIS — E8809 Other disorders of plasma-protein metabolism, not elsewhere classified: Secondary | ICD-10-CM | POA: Diagnosis not present

## 2016-05-14 DIAGNOSIS — R1110 Vomiting, unspecified: Secondary | ICD-10-CM | POA: Diagnosis not present

## 2016-05-14 DIAGNOSIS — D649 Anemia, unspecified: Secondary | ICD-10-CM

## 2016-05-14 DIAGNOSIS — K310 Acute dilatation of stomach: Secondary | ICD-10-CM | POA: Diagnosis not present

## 2016-05-14 MED ORDER — ONDANSETRON HCL 4 MG PO TABS: 4.0000 mg | ORAL_TABLET | Freq: Three times a day (TID) | ORAL | Status: DC | PRN

## 2016-05-14 NOTE — Progress Notes (Signed)
Patient: [name] Male    DOB: [birth year]   65 y.o.   MRN: [id number] Visit Date: 05/14/2016  Today's Provider: Lelon Huh, MD   Chief Complaint  Patient presents with  . Vomiting    x 2 days   Subjective:    HPI Vomiting: Patient comes in reporting he has had several episode of vomiting in the past 2 days. Patient states eating or drinking he feels nauseous. When he lays down he feels food coming back up in his mouth. Patient states he tried to improve symptoms by sticking his finger down his throat to induce vomiting. Had at least 4 episodes of vomiting yesterday, and maybe one today. No abdominal pain. Didn't have a BM for two days before yesterday, but took OTC Miralax yesterday and has had several BMs in the last two days. Patient denies vomiting up any blood.   He has history small bowel obstruction requiring laparoscopic surgery in April. He had follow up with Dr. Dahlia Byes at [facility] on 4-24 and thought to be doing well.  Patients blood pressure has been running low at home, around  [id number] systolic over [id number] diastolic. Blood pressure improves after drinking salt water mix. Patient has also been dizzy and felt like he is about to pass out. He also denies fever,sore throat and abdominal pain. Has cut back on olanzepine to once at night for the last couple of nights.  Labs over the last few weeks show mild anemia, normal ferritin, b12 and folate levels, and low albumen. We also had him decrease olanzapine due to hypotension.     No Known Allergies Previous Medications   CLONAZEPAM (KLONOPIN) 1 MG TABLET    Take 1 tablet (1 mg total) by mouth at bedtime.   OLANZAPINE (ZYPREXA) 15 MG TABLET    Take 2 tablets (30 mg total) by mouth at bedtime.   PAROXETINE (PAXIL) 30 MG TABLET    Take 0.5 tablets (15 mg total) by mouth daily.   POLYETHYLENE GLYCOL (MIRALAX / GLYCOLAX) PACKET    Take 17 g by mouth daily.   RISPERIDONE (RISPERDAL) 4 MG TABLET    Take 1 tablet (4 mg total) by  mouth at bedtime.    Review of Systems  Constitutional: Positive for fatigue. Negative for fever, chills and appetite change.  Respiratory: Negative for chest tightness, shortness of breath and wheezing.   Cardiovascular: Negative for chest pain and palpitations.  Gastrointestinal: Positive for nausea, vomiting and abdominal distention. Negative for abdominal pain, diarrhea and constipation.  Genitourinary: Negative for dysuria.  Neurological: Positive for dizziness, weakness, light-headedness and numbness (in fingers on left hand for several years due to injury). Negative for headaches.    Social History  Substance Use Topics  . Smoking status: Former Smoker    Types: Cigarettes    Quit date: 08/04/2004  . Smokeless tobacco: Former Systems developer    Quit date: 08/04/2004  . Alcohol Use: No   Objective:   BP 94/62 mmHg  Pulse 92  Temp(Src) 98.5 F (36.9 C) (Oral)  Resp 16  Wt 222 lb (100.699 kg)  SpO2 96%  Physical Exam   General Appearance:    Alert, cooperative, no distress  Eyes:    PERRL, conjunctiva/corneas clear, EOM's intact       Lungs:     Clear to auscultation bilaterally, respirations unlabored  Heart:    Regular rate and rhythm  Neurologic:   Awake, alert, oriented x 3. No apparent focal neurological  defect.   Abd:   Soft, non-tender, non-distended, hypoactive bowel sounds.        Assessment & Plan:     1. Anemia, unspecified anemia type Check CBC for stability - CBC  2. Hypoalbuminemia  - Serum protein electrophoresis with reflex  3. Non-intractable vomiting with nausea, vomiting of unspecified type Considering history of SBO, need abdominal XR. Seems like symptoms are worse at night and may be related to reflux. Consider GI referral.  - DG Abd 2 Views; Future      The entirety of the information documented in the History of Present Illness, Review of Systems and Physical Exam were personally obtained by me. Portions of this information were  initially documented by April M. Sabra Heck, CMA and reviewed by me for thoroughness and accuracy.    Lelon Huh, MD  [facility]

## 2016-05-17 ENCOUNTER — Telehealth: Payer: Self-pay | Admitting: Family Medicine

## 2016-05-17 DIAGNOSIS — K56609 Unspecified intestinal obstruction, unspecified as to partial versus complete obstruction: Secondary | ICD-10-CM

## 2016-05-17 DIAGNOSIS — R110 Nausea: Secondary | ICD-10-CM

## 2016-05-17 NOTE — Telephone Encounter (Signed)
Patient's sister [name] stated patient started vomiting 4 days ago (which she says is from reflux). Patient has been belching a lot since his surgery and he is still doing that all the time. [name] stated she has been giving pt otc liquid acid reflux medication (she does not remember the name). Patient has stopped vomiting but still has belching. [name] was requesting an rx for acid reflux so insurance will pay for it.

## 2016-05-17 NOTE — Telephone Encounter (Signed)
Please advise 

## 2016-05-17 NOTE — Telephone Encounter (Signed)
Please schedule referral to GI. Patient prefers to see Dr. Dahlia Byes if possible.

## 2016-05-17 NOTE — Telephone Encounter (Signed)
Pt needs something called in for Acid reflux  They use [facility]  Pt call back is [phone]  [facility]

## 2016-05-17 NOTE — Telephone Encounter (Signed)
His labs show is mildly anemic, and Xray shows a lot of gas in stomach. He needs referral to GI for follow up.  He needs to take OTC Gas-X for stomach. Prescription reflux medications will likely make his symptoms worse.

## 2016-05-17 NOTE — Telephone Encounter (Signed)
[name] was notified. Patient is agreeable to referral for GI.

## 2016-05-17 NOTE — Telephone Encounter (Signed)
Need more information. What are symptoms, what has he taken, has he been seen for this before. Probably needs office visit for evaluation if not urgent.

## 2016-05-18 ENCOUNTER — Telehealth: Payer: Self-pay | Admitting: Family Medicine

## 2016-05-18 ENCOUNTER — Inpatient Hospital Stay
Admission: EM | Admit: 2016-05-18 | Discharge: 2016-05-20 | DRG: 312 | Disposition: A | Discharge status: Home / Self Care | Payer: Medicare PPO | Attending: Internal Medicine | Admitting: Internal Medicine

## 2016-05-18 ENCOUNTER — Emergency Department: Payer: Medicare PPO

## 2016-05-18 ENCOUNTER — Encounter: Payer: Self-pay | Admitting: Emergency Medicine

## 2016-05-18 DIAGNOSIS — I951 Orthostatic hypotension: Secondary | ICD-10-CM

## 2016-05-18 DIAGNOSIS — K5660 Unspecified intestinal obstruction: Secondary | ICD-10-CM | POA: Diagnosis not present

## 2016-05-18 DIAGNOSIS — K810 Acute cholecystitis: Secondary | ICD-10-CM | POA: Diagnosis not present

## 2016-05-18 DIAGNOSIS — F209 Schizophrenia, unspecified: Secondary | ICD-10-CM | POA: Diagnosis not present

## 2016-05-18 DIAGNOSIS — Z800 Family history of malignant neoplasm of digestive organs: Secondary | ICD-10-CM | POA: Diagnosis not present

## 2016-05-18 DIAGNOSIS — W19XXXA Unspecified fall, initial encounter: Secondary | ICD-10-CM | POA: Diagnosis present

## 2016-05-18 DIAGNOSIS — Z833 Family history of diabetes mellitus: Secondary | ICD-10-CM | POA: Diagnosis not present

## 2016-05-18 DIAGNOSIS — R296 Repeated falls: Secondary | ICD-10-CM | POA: Diagnosis not present

## 2016-05-18 DIAGNOSIS — R42 Dizziness and giddiness: Secondary | ICD-10-CM | POA: Diagnosis not present

## 2016-05-18 DIAGNOSIS — Z818 Family history of other mental and behavioral disorders: Secondary | ICD-10-CM | POA: Diagnosis not present

## 2016-05-18 DIAGNOSIS — I959 Hypotension, unspecified: Secondary | ICD-10-CM | POA: Diagnosis not present

## 2016-05-18 DIAGNOSIS — Z823 Family history of stroke: Secondary | ICD-10-CM | POA: Diagnosis not present

## 2016-05-18 DIAGNOSIS — K828 Other specified diseases of gallbladder: Secondary | ICD-10-CM | POA: Diagnosis not present

## 2016-05-18 DIAGNOSIS — E785 Hyperlipidemia, unspecified: Secondary | ICD-10-CM | POA: Diagnosis not present

## 2016-05-18 DIAGNOSIS — Z801 Family history of malignant neoplasm of trachea, bronchus and lung: Secondary | ICD-10-CM | POA: Diagnosis not present

## 2016-05-18 DIAGNOSIS — N183 Chronic kidney disease, stage 3 (moderate): Secondary | ICD-10-CM | POA: Diagnosis not present

## 2016-05-18 DIAGNOSIS — Z87891 Personal history of nicotine dependence: Secondary | ICD-10-CM

## 2016-05-18 DIAGNOSIS — N179 Acute kidney failure, unspecified: Secondary | ICD-10-CM | POA: Diagnosis present

## 2016-05-18 DIAGNOSIS — K819 Cholecystitis, unspecified: Secondary | ICD-10-CM

## 2016-05-18 DIAGNOSIS — Y92009 Unspecified place in unspecified non-institutional (private) residence as the place of occurrence of the external cause: Secondary | ICD-10-CM

## 2016-05-18 DIAGNOSIS — Z8249 Family history of ischemic heart disease and other diseases of the circulatory system: Secondary | ICD-10-CM

## 2016-05-18 DIAGNOSIS — S0083XA Contusion of other part of head, initial encounter: Secondary | ICD-10-CM

## 2016-05-18 DIAGNOSIS — S00531A Contusion of lip, initial encounter: Secondary | ICD-10-CM | POA: Diagnosis present

## 2016-05-18 DIAGNOSIS — R410 Disorientation, unspecified: Secondary | ICD-10-CM | POA: Diagnosis not present

## 2016-05-18 HISTORY — DX: Schizophrenia, unspecified: F20.9

## 2016-05-18 LAB — PROTEIN ELECTROPHORESIS, SERUM, WITH REFLEX
A/G Ratio: 0.7 (ref 0.7–1.7)
ALBUMIN ELP: 2.9 g/dL (ref 2.9–4.4)
ALPHA 2: 1.1 g/dL — AB (ref 0.4–1.0)
Alpha 1: 0.4 g/dL (ref 0.0–0.4)
Beta: 1.6 g/dL — ABNORMAL HIGH (ref 0.7–1.3)
GAMMA GLOBULIN: 1.1 g/dL (ref 0.4–1.8)
Globulin, Total: 4.2 g/dL — ABNORMAL HIGH (ref 2.2–3.9)
TOTAL PROTEIN: 7.1 g/dL (ref 6.0–8.5)

## 2016-05-18 LAB — CBC
HCT: 33.8 % — ABNORMAL LOW (ref 40.0–52.0)
HEMATOCRIT: 32.2 % — AB (ref 37.5–51.0)
HEMOGLOBIN: 10.3 g/dL — AB (ref 12.6–17.7)
HEMOGLOBIN: 11 g/dL — AB (ref 13.0–18.0)
MCH: 26.2 pg — AB (ref 26.6–33.0)
MCH: 26.7 pg (ref 26.0–34.0)
MCHC: 32 g/dL (ref 31.5–35.7)
MCHC: 32.7 g/dL (ref 32.0–36.0)
MCV: 82 fL (ref 79–97)
MCV: 81.7 fL (ref 80.0–100.0)
Platelets: 427 10*3/uL — ABNORMAL HIGH (ref 150–379)
Platelets: 342 10*3/uL (ref 150–440)
RBC: 3.93 x10E6/uL — ABNORMAL LOW (ref 4.14–5.80)
RBC: 4.13 MIL/uL — ABNORMAL LOW (ref 4.40–5.90)
RDW: 14 % (ref 12.3–15.4)
RDW: 14.8 % — AB (ref 11.5–14.5)
WBC: 13.2 10*3/uL — ABNORMAL HIGH (ref 3.4–10.8)
WBC: 10.4 10*3/uL (ref 3.8–10.6)

## 2016-05-18 LAB — BASIC METABOLIC PANEL
ANION GAP: 9 (ref 5–15)
BUN: 21 mg/dL — AB (ref 6–20)
CALCIUM: 9 mg/dL (ref 8.9–10.3)
CO2: 26 mmol/L (ref 22–32)
CREATININE: 1.73 mg/dL — AB (ref 0.61–1.24)
Chloride: 104 mmol/L (ref 101–111)
GFR calc Af Amer: 46 mL/min — ABNORMAL LOW (ref >60)
GFR calc non Af Amer: 39 mL/min — ABNORMAL LOW (ref >60)
GLUCOSE: 115 mg/dL — AB (ref 65–99)
Potassium: 4 mmol/L (ref 3.5–5.1)
Sodium: 139 mmol/L (ref 135–145)

## 2016-05-18 LAB — HEPATIC FUNCTION PANEL
ALBUMIN: 3.3 g/dL — AB (ref 3.5–5.0)
ALT: 27 U/L (ref 17–63)
AST: 31 U/L (ref 15–41)
Alkaline Phosphatase: 85 U/L (ref 38–126)
BILIRUBIN TOTAL: 0.2 mg/dL — AB (ref 0.3–1.2)
Bilirubin, Direct: <0.1 mg/dL — ABNORMAL LOW (ref 0.1–0.5)
Total Protein: 7.5 g/dL (ref 6.5–8.1)

## 2016-05-18 LAB — URINALYSIS COMPLETE WITH MICROSCOPIC (ARMC ONLY)
Bacteria, UA: NONE SEEN
Bilirubin Urine: NEGATIVE
GLUCOSE, UA: NEGATIVE mg/dL
Hgb urine dipstick: NEGATIVE
KETONES UR: NEGATIVE mg/dL
Leukocytes, UA: NEGATIVE
Nitrite: NEGATIVE
PROTEIN: 30 mg/dL — AB
Specific Gravity, Urine: 1.018 (ref 1.005–1.030)
Squamous Epithelial / LPF: NONE SEEN
pH: 7 (ref 5.0–8.0)

## 2016-05-18 LAB — TROPONIN I

## 2016-05-18 LAB — MAGNESIUM: Magnesium: 2.2 mg/dL (ref 1.7–2.4)

## 2016-05-18 LAB — LIPASE, BLOOD: Lipase: 23 U/L (ref 11–51)

## 2016-05-18 MED ORDER — HEPARIN SODIUM (PORCINE) 5000 UNIT/ML IJ SOLN
5000.0000 [IU] | Freq: Three times a day (TID) | INTRAMUSCULAR | Status: DC
  Administered 2016-05-18 – 2016-05-20 (×6): 5000 [IU] via SUBCUTANEOUS
  Filled 2016-05-18 (×6): qty 1

## 2016-05-18 MED ORDER — OLANZAPINE 7.5 MG PO TABS
15.0000 mg | ORAL_TABLET | Freq: Every day | ORAL | Status: DC
  Administered 2016-05-18 – 2016-05-19 (×2): 15 mg via ORAL
  Filled 2016-05-18 (×2): qty 2

## 2016-05-18 MED ORDER — CLONAZEPAM 1 MG PO TABS
1.0000 mg | ORAL_TABLET | Freq: Every day | ORAL | Status: DC
  Administered 2016-05-18 – 2016-05-19 (×2): 1 mg via ORAL
  Filled 2016-05-18 (×2): qty 1

## 2016-05-18 MED ORDER — SODIUM CHLORIDE 0.9 % IV BOLUS (SEPSIS)
1000.0000 mL | Freq: Once | INTRAVENOUS | Status: AC
  Administered 2016-05-18: 1000 mL via INTRAVENOUS

## 2016-05-18 MED ORDER — DIATRIZOATE MEGLUMINE & SODIUM 66-10 % PO SOLN
15.0000 mL | ORAL | Status: AC
  Administered 2016-05-18: 15 mL via ORAL

## 2016-05-18 MED ORDER — MORPHINE SULFATE (PF) 2 MG/ML IV SOLN: 2.0000 mg | INTRAVENOUS | Status: DC | PRN

## 2016-05-18 MED ORDER — ALUM & MAG HYDROXIDE-SIMETH 200-200-20 MG/5ML PO SUSP: 30.0000 mL | Freq: Four times a day (QID) | ORAL | Status: DC | PRN

## 2016-05-18 MED ORDER — RISPERIDONE 3 MG PO TABS
4.0000 mg | ORAL_TABLET | Freq: Every day | ORAL | Status: DC
Start: 2016-05-18 — End: 2016-05-20
  Administered 2016-05-18 – 2016-05-19 (×2): 4 mg via ORAL
  Filled 2016-05-18 (×2): qty 1

## 2016-05-18 MED ORDER — PAROXETINE HCL 10 MG PO TABS
15.0000 mg | ORAL_TABLET | Freq: Every day | ORAL | Status: DC
  Administered 2016-05-18 – 2016-05-19 (×2): 15 mg via ORAL
  Filled 2016-05-18 (×2): qty 1
  Filled 2016-05-18: qty 2

## 2016-05-18 MED ORDER — POLYETHYLENE GLYCOL 3350 17 G PO PACK
17.0000 g | PACK | ORAL | Status: DC
  Administered 2016-05-18 – 2016-05-20 (×2): 17 g via ORAL
  Filled 2016-05-18 (×2): qty 1

## 2016-05-18 MED ORDER — ONDANSETRON HCL 4 MG/2ML IJ SOLN: 4.0000 mg | Freq: Four times a day (QID) | INTRAMUSCULAR | Status: DC | PRN

## 2016-05-18 MED ORDER — DEXTROSE 5 % IV SOLN
1.0000 g | INTRAVENOUS | Status: DC
  Administered 2016-05-18 – 2016-05-19 (×2): 1 g via INTRAVENOUS
  Filled 2016-05-18 (×3): qty 10

## 2016-05-18 MED ORDER — IOPAMIDOL (ISOVUE-300) INJECTION 61%
75.0000 mL | Freq: Once | INTRAVENOUS | Status: AC | PRN
  Administered 2016-05-18: 75 mL via INTRAVENOUS

## 2016-05-18 MED ORDER — ACETAMINOPHEN 325 MG PO TABS: 650.0000 mg | ORAL_TABLET | Freq: Four times a day (QID) | ORAL | Status: DC | PRN

## 2016-05-18 MED ORDER — ALBUTEROL SULFATE (2.5 MG/3ML) 0.083% IN NEBU
2.5000 mg | INHALATION_SOLUTION | RESPIRATORY_TRACT | Status: DC | PRN
Start: 2016-05-18 — End: 2016-05-20

## 2016-05-18 MED ORDER — SODIUM CHLORIDE 0.9 % IV SOLN
INTRAVENOUS | Status: DC
Start: 2016-05-18 — End: 2016-05-20
  Administered 2016-05-18 – 2016-05-20 (×4): via INTRAVENOUS

## 2016-05-18 MED ORDER — OXYCODONE-ACETAMINOPHEN 5-325 MG PO TABS
1.0000 | ORAL_TABLET | Freq: Four times a day (QID) | ORAL | Status: DC | PRN
Start: 2016-05-18 — End: 2016-05-20

## 2016-05-18 MED ORDER — ONDANSETRON HCL 4 MG PO TABS: 4.0000 mg | ORAL_TABLET | Freq: Four times a day (QID) | ORAL | Status: DC | PRN

## 2016-05-18 MED ORDER — ACETAMINOPHEN 650 MG RE SUPP: 650.0000 mg | Freq: Four times a day (QID) | RECTAL | Status: DC | PRN

## 2016-05-18 NOTE — H&P (Addendum)
[place] [facility] [place] NAME: [name]    MR#:  [id number]  DATE OF BIRTH:  November 29, 1951  DATE OF ADMISSION:  05/18/2016  PRIMARY CARE PHYSICIAN: Lelon Huh, MD   REQUESTING/REFERRING PHYSICIAN: Lisa Roca, MD  CHIEF COMPLAINT:   Chief Complaint  Patient presents with  . Dizziness  . Fall   Dizziness and fall today. HISTORY OF PRESENT ILLNESS:  [name]  is a 65 y.o. male with a known history of Schizophrenia, CKD stage II, anemia, SBO, HLP. The patient was sent to ED due to dizziness and fall today. The patient denies any syncope or seizure. He denies any abdominal pain, nausea, vomiting or diarrhea. But according to his sister, the patient had abdominal pain, nausea and vomiting today. He was found hypotension. CAT scan of abdomen show acute cholecystitis. ED physician discussed with on-call surgeon, who suggested no indication for surgery at this time.  PAST MEDICAL HISTORY:   Past Medical History  Diagnosis Date  . Chicken pox   . Measles   . Mumps   . Dizziness   . Schizophrenia ([place])     PAST SURGICAL HISTORY:   Past Surgical History  Procedure Laterality Date  . None    . Laparotomy N/A 03/20/2016    Procedure: EXPLORATORY LAPAROTOMY with lysis of adhesions;  Surgeon: Jules Husbands, MD;  Location: [facility] ORS;  Service: General;  Laterality: N/A;  . Colon surgery    . Abdominal surgery      SOCIAL HISTORY:   Social History  Substance Use Topics  . Smoking status: Former Smoker    Types: Cigarettes    Quit date: 08/04/2004  . Smokeless tobacco: Former Systems developer    Quit date: 08/04/2004  . Alcohol Use: No    FAMILY HISTORY:   Family History  Problem Relation Age of Onset  . Diabetes Mother   . Heart disease Mother   . Stroke Mother   . Diabetes Father   . Hypertension Father   . Lung cancer Father   . Cancer Father     lung cancer  . Schizophrenia Sister   . Lung cancer Sister   . Colon cancer  Sister     DRUG ALLERGIES:  No Known Allergies  REVIEW OF SYSTEMS:  CONSTITUTIONAL: No fever, fatigue or weakness. But has dizziness and fall. EYES: No blurred or double vision.  EARS, NOSE, AND THROAT: No tinnitus or ear pain.  RESPIRATORY: No cough, shortness of breath, wheezing or hemoptysis.  CARDIOVASCULAR: No chest pain, orthopnea, edema.  GASTROINTESTINAL: No nausea, vomiting, diarrhea or abdominal pain.  GENITOURINARY: No dysuria, hematuria.  ENDOCRINE: No polyuria, nocturia,  HEMATOLOGY: No anemia, easy bruising or bleeding SKIN: No rash or lesion. MUSCULOSKELETAL: No joint pain or arthritis.   NEUROLOGIC: No tingling, numbness, weakness.  PSYCHIATRY: No anxiety or depression.   MEDICATIONS AT HOME:   Prior to Admission medications   Medication Sig Start Date End Date Taking? Authorizing Provider  alum & mag hydroxide-simeth (MAALOX/MYLANTA) 200-200-20 MG/5ML suspension Take 30 mLs by mouth every 6 (six) hours as needed for indigestion or heartburn.   Yes Historical Provider, MD  clonazePAM (KLONOPIN) 1 MG tablet Take 1 tablet (1 mg total) by mouth at bedtime. 05/04/16  Yes Himabindu Ravi, MD  OLANZapine (ZYPREXA) 15 MG tablet Take 15 mg by mouth at bedtime.   Yes Historical Provider, MD  ondansetron (ZOFRAN) 4 MG tablet Take 1 tablet (4 mg total) by mouth every 8 (  eight) hours as needed for nausea or vomiting. 05/14/16  Yes Birdie Sons, MD  PARoxetine (PAXIL) 30 MG tablet Take 15 mg by mouth at bedtime.   Yes Historical Provider, MD  polyethylene glycol (MIRALAX / GLYCOLAX) packet Take 17 g by mouth every other day.   Yes Historical Provider, MD  risperidone (RISPERDAL) 4 MG tablet Take 1 tablet (4 mg total) by mouth at bedtime. 05/04/16  Yes Himabindu Ravi, MD      VITAL SIGNS:  Blood pressure 140/84, pulse 75, temperature 97.8 F (36.6 C), temperature source Oral, resp. rate 17, height 6' (1.829 m), weight 222 lb (100.699 kg), SpO2 97 %.  PHYSICAL EXAMINATION:   GENERAL:  65 y.o.-year-old patient lying in the bed with no acute distress. Obese. EYES: Pupils equal, round, reactive to light and accommodation. No scleral icterus. Extraocular muscles intact.  HEENT: Head atraumatic, normocephalic. Oropharynx and nasopharynx clear.  NECK:  Supple, no jugular venous distention. No thyroid enlargement, no tenderness.  LUNGS: Normal breath sounds bilaterally, no wheezing, rales,rhonchi or crepitation. No use of accessory muscles of respiration.  CARDIOVASCULAR: S1, S2 normal. No murmurs, rubs, or gallops.  ABDOMEN: Soft, nontender, nondistended. Bowel sounds present. No organomegaly or mass.  EXTREMITIES: No pedal edema, cyanosis, or clubbing.  NEUROLOGIC: Cranial nerves II through XII are intact. Muscle strength 5/5 in all extremities. Sensation intact. Gait not checked.  PSYCHIATRIC: The patient is alert and oriented x 3.  SKIN: No obvious rash, lesion, or ulcer.   LABORATORY PANEL:   CBC  Recent Labs Lab 05/18/16 0855  WBC 10.4  HGB 11.0*  HCT 33.8*  PLT 342   ------------------------------------------------------------------------------------------------------------------  Chemistries   Recent Labs Lab 05/18/16 0855  NA 139  K 4.0  CL 104  CO2 26  GLUCOSE 115*  BUN 21*  CREATININE 1.73*  CALCIUM 9.0  AST 31  ALT 27  ALKPHOS 85  BILITOT 0.2*   ------------------------------------------------------------------------------------------------------------------  Cardiac Enzymes  Recent Labs Lab 05/18/16 0855  TROPONINI <0.03   ------------------------------------------------------------------------------------------------------------------  RADIOLOGY:  Ct Head Wo Contrast  05/18/2016  CLINICAL DATA:  Dizziness and confusion after fall. EXAM: CT HEAD WITHOUT CONTRAST TECHNIQUE: Contiguous axial images were obtained from the base of the skull through the vertex without intravenous contrast. COMPARISON:  March 18, 2016 FINDINGS:  Mild generalized atrophy is stable. There is no intracranial mass, hemorrhage, extra-axial fluid collection, or midline shift. Gray-white compartments appear unremarkable. No focal gray-white compartment lesions are identified. No acute infarct evident. Bony calvarium appears intact. The mastoid air cells are clear. No intraorbital lesions are evident. IMPRESSION: Atrophy, stable. No intracranial mass, hemorrhage, or extra-axial fluid collection. No focal gray - white compartment lesions. Electronically Signed   By: Lowella Grip III M.D.   On: 05/18/2016 09:25   Ct Abdomen Pelvis W Contrast  05/18/2016  CLINICAL DATA:  Patient had recent surgery for repair of small bowel obstruction. Patient has abdominal distention vomiting currently. EXAM: CT ABDOMEN AND PELVIS WITH CONTRAST TECHNIQUE: Multidetector CT imaging of the abdomen and pelvis was performed using the standard protocol following bolus administration of intravenous contrast. CONTRAST:  48mL ISOVUE-300 IOPAMIDOL (ISOVUE-300) INJECTION 61% COMPARISON:  March 18, 2016 FINDINGS: Lower chest:  No acute findings. Hepatobiliary: There is an inflammation around the gallbladder with gallbladder wall thickening and a 5 mm stone in the gallbladder neck. There is edema surrounding the gallbladder. The liver is normal. Pancreas: No mass, inflammatory changes, or other significant abnormality. Spleen: Within normal limits in size and appearance. Adrenals/Urinary  Tract: The adrenal glands and kidneys are normal. The bladder is normal. No masses identified. No evidence of hydronephrosis. Stomach/Bowel: There is mild thickening of bowel wall of the first and second portion duodenum likely related to the adjacent inflammation of the gallbladder. No evidence of obstruction, or abnormal fluid collections. The appendix is normal. Vascular/Lymphatic: No pathologically enlarged lymph nodes. No evidence of abdominal aortic aneurysm. There is atherosclerosis of the abdominal  aorta. Reproductive: No mass or other significant abnormality. Other: Small free fluid is identified in the pelvis. There are small bilateral inguinal herniation of mesenteric fat. Musculoskeletal: Degenerative joint changes of the spine are identified. IMPRESSION: Findings consistent with acute cholecystitis. Mild thickened bowel wall of the first and second portion of duodenum likely related to adjacent inflammation of the gallbladder. Small free fluid in the pelvis. Electronically Signed   By: Abelardo Diesel M.D.   On: 05/18/2016 17:01    EKG:   Orders placed or performed during the hospital encounter of 05/18/16  . ED EKG  . ED EKG  . EKG 12-Lead  . EKG 12-Lead    IMPRESSION AND PLAN:   Acute cholecystitis. Start Rocephin IV, follow-up CBC and abdominal ultrasound. Follow-up surgeon.  Acute renal failure on CKD stage 3. Start pneumo saline IV and follow-up BMP.  Hypotension. Patient's blood pressure was in low side but blood pressure is normal now.  Schizophrenia. Continue home medication.   All the records are reviewed and case discussed with ED provider. Management plans discussed with the patient,  his sister and they are in agreement.  CODE STATUS: Full code according to his sister.   TOTAL TIME TAKING CARE OF THIS PATIENT:  53 minutes.    Demetrios Loll M.D on 05/18/2016 at 6:16 PM  Between 7am to 6pm - Pager - [phone]  After 6pm go to [email or web address] - password [email or web address] [facility]  Office  [phone]  CC: Primary care physician; Lelon Huh, MD

## 2016-05-18 NOTE — Telephone Encounter (Signed)
I spoke with [name] who request referral to Dr Allen Norris.She was advised that his office would call # in chart

## 2016-05-18 NOTE — ED Notes (Signed)
Assisted patient to the restroom to urinate. Patient walked well with assistance to the restroom.

## 2016-05-18 NOTE — ED Notes (Signed)
Pt sitting in bed, resp even and unlabored, family at bedside, pt drinking CT contrast

## 2016-05-18 NOTE — ED Provider Notes (Signed)
[name] Provider Note   ____________________________________________  Time seen:  I have reviewed the triage vital signs and the triage nursing note.  HISTORY  Chief Complaint Dizziness and [name] Patient's guardian, the sister provides the history Patient unable to provide his own history due to underlying schizophrenia.  HPI [name] is a 65 y.o. male is here with his sister who is his caretaker, he has a history of schizophrenia and recently small bowel obstruction with surgical repair, is here for weakness and falls. Family states this has been happening more frequently since the surgery on April 6. He's been having low blood pressures at home. About a week ago his primary care physician, [name], decreased his Zyprexa from 30 mg down to 15 mg. He is still currently on Paxil, Risperdal, Zyprexa, and then Klonopin just once at night.  Overnight he apparently got up on his own and had a fall and his dentures came out and broke. This was unwitnessed. Does not sound like he is actually having any pain now. He does have a bruised lower lip. Symptoms are mild.  No fever. No cough. No abdominal pain. He has had some loose bowel movements. He has had a lot of burping and belching which is foul smelling and some vomiting. He recently saw his primary care physician who was going to refer him to gastroenterology out of concern for what sounds like persistent obstruction. Family is concerned that he may be having a recurrence of the bowel obstruction.    Past Medical History  Diagnosis Date  . Chicken pox   . Measles   . Mumps   . Dizziness   . Schizophrenia [facility])     Patient Active Problem List   Diagnosis Date Noted  . Chronic nausea 05/17/2016  . Hypoalbuminemia 05/14/2016  . Anemia 05/11/2016  . Hypotension 05/04/2016  . SBO (small bowel obstruction) ([place]) 03/18/2016  . Dizziness 05/16/2015  . Edema 05/15/2015   . Venous stasis dermatitis 05/15/2015  . Schizophrenia, simple, chronic ([place]) 08/21/2009  . Kidney Disease, Chronic stage II (mild) 08/20/2009  . Hyperlipemia 08/19/2009    Past Surgical History  Procedure Laterality Date  . None    . Laparotomy N/A 03/20/2016    Procedure: EXPLORATORY LAPAROTOMY with lysis of adhesions;  Surgeon: Jules Husbands, MD;  Location: [facility] ORS;  Service: General;  Laterality: N/A;  . Colon surgery    . Abdominal surgery      Current Outpatient Rx  Name  Route  Sig  Dispense  Refill  . clonazePAM (KLONOPIN) 1 MG tablet   Oral   Take 1 tablet (1 mg total) by mouth at bedtime.   30 tablet   3   . OLANZapine (ZYPREXA) 15 MG tablet   Oral   Take 2 tablets (30 mg total) by mouth at bedtime.   60 tablet   3   . ondansetron (ZOFRAN) 4 MG tablet   Oral   Take 1 tablet (4 mg total) by mouth every 8 (eight) hours as needed for nausea or vomiting.   12 tablet   0   . PARoxetine (PAXIL) 30 MG tablet   Oral   Take 0.5 tablets (15 mg total) by mouth daily.   15 tablet   3   . polyethylene glycol (MIRALAX / GLYCOLAX) packet   Oral   Take 17 g by mouth daily.   14 each   3   . risperidone (RISPERDAL)  4 MG tablet   Oral   Take 1 tablet (4 mg total) by mouth at bedtime.   30 tablet   2     Allergies Review of patient's allergies indicates no known allergies.  Family History  Problem Relation Age of Onset  . Diabetes Mother   . Heart disease Mother   . Stroke Mother   . Diabetes Father   . Hypertension Father   . Lung cancer Father   . Cancer Father     lung cancer  . Schizophrenia Sister   . Lung cancer Sister   . Colon cancer Sister     Social History Social History  Substance Use Topics  . Smoking status: Former Smoker    Types: Cigarettes    Quit date: 08/04/2004  . Smokeless tobacco: Former Systems developer    Quit date: 08/04/2004  . Alcohol Use: No    Review of Systems  Constitutional: Negative for fever. Eyes: Negative for  visual changes. ENT: Negative for sore throat. Cardiovascular: Negative for chest pain. Respiratory: Negative for shortness of breath. Gastrointestinal:Abdominal bloating, and foul-smelling emesis.. Genitourinary: Negative for dysuria. Musculoskeletal: Negative for back pain. Skin: Negative for rash. Neurological: Negative for headache. 10 point Review of Systems otherwise negative ____________________________________________   PHYSICAL EXAM:  VITAL SIGNS: ED Triage Vitals  Enc Vitals Group     BP 05/18/16 0841 99/63 mmHg     Pulse Rate 05/18/16 0841 93     Resp 05/18/16 0841 18     Temp 05/18/16 0841 97.8 F (36.6 C)     Temp Source 05/18/16 0841 Oral     SpO2 05/18/16 0841 95 %     Weight 05/18/16 0841 222 lb (100.699 kg)     Height 05/18/16 0841 6' (1.829 m)     Head Cir --      Peak Flow --      Pain Score 05/18/16 0842 4     Pain Loc --      Pain Edu? --      Excl. in [place]? --      Constitutional: Alert and oriented. Well appearing and in no distress. HEENT   Head: Normocephalic.        Eyes: Conjunctivae are normal. PERRL. Normal extraocular movements.      Ears:         Nose: No congestion/rhinnorhea.   Mouth/Throat: Mucous membranes are moist. Lower lip bruise and inner lip mild laceration   Neck: No stridor. Nontender cervical spine and no step-offs. Cardiovascular/Chest: Normal rate, regular rhythm.  No murmurs, rubs, or gallops. Respiratory: Normal respiratory effort without tachypnea nor retractions. Breath sounds are clear and equal bilaterally. No wheezes/rales/rhonchi. Gastrointestinal: Soft. Mild distention, no guarding or rebound. Mild tenderness diffusely.  Genitourinary/rectal:Deferred Musculoskeletal: Nontender with normal range of motion in all extremities. No joint effusions.  No lower extremity tenderness.  No edema. Neurologic:  Doesn't speak much, but no slurred speech. No facial droop. Moves 4 extremities with 5 out of 5 strength in 4  extremities. Skin:  Skin is warm, dry and intact. No rash noted. Psychiatric: Flat affect.  ____________________________________________   EKG I, Lisa Roca, MD, the attending physician have personally viewed and interpreted all ECGs.  89 bpm. Normal sinus rhythm. Narrow QRS. Normal axis. Nonspecific ST-T wave ____________________________________________  LABS (pertinent positives/negatives)  Labs Reviewed  BASIC METABOLIC PANEL - Abnormal; Notable for the following:    Glucose, Bld 115 (*)    BUN 21 (*)    Creatinine, Ser  1.73 (*)    GFR calc non Af Amer 39 (*)    GFR calc Af Amer 46 (*)    All other components within normal limits  CBC - Abnormal; Notable for the following:    RBC 4.13 (*)    Hemoglobin 11.0 (*)    HCT 33.8 (*)    RDW 14.8 (*)    All other components within normal limits  URINALYSIS COMPLETEWITH MICROSCOPIC ([facility] ONLY) - Abnormal; Notable for the following:    Color, Urine YELLOW (*)    APPearance CLEAR (*)    Protein, ur 30 (*)    All other components within normal limits  TROPONIN I  CBG MONITORING, ED    ____________________________________________  RADIOLOGY All Xrays were viewed by me. Imaging interpreted by Radiologist.  CT head without contrast:  IMPRESSION: Atrophy, stable. No intracranial mass, hemorrhage, or extra-axial fluid collection. No focal gray - white compartment lesions.  CT abdomen and pelvis with contrast: Pending __________________________________________  PROCEDURES  Procedure(s) performed: None  Critical Care performed: None  ____________________________________________   ED COURSE / ASSESSMENT AND PLAN  Pertinent labs & imaging results that were available during my care of the patient were reviewed by me and considered in my medical decision making (see chart for details).  Patient was brought in for evaluation after unwitnessed fall, with only traumatic evidence of bruise to his lower lip. The small  mucosal laceration is minor and does not need repair.  Head CT is negative. Next the next like clinically he does not appear to have any evidence of neck pain, chest or extremity pain, and in terms of his abdomen he has had distention and bowel belching and emesis for the past couple of days with a recent history of bowel obstruction and so I discussed with the family obtaining CT the abdomen and pelvis and they wanted to proceed.  In terms of why he fell, it was found one low blood pressure here, and family describes at home he has been dealing with low blood pressures sometimes systolic in the [id number]. It sounds like it's probably orthostatic hypotension. Here he did drop from 123XX123 systolic down to 123456 or so systolic. No significant change in his heart rate.  I can see how if he's been in the 80s at home that may be causing him to fall.  Sounds like no certain cause is been found for this, but it seems like it's happened since the recent surgery. His partner care doctor is aware of this and decreased his Zyprexa by half about a week or so ago, but family does not think that there is no change in the orthostatic hypotension.  Sr. is a little wary to decrease his psychiatric medications because he used to be quite violent.  I was unable to get hold with the patient's primary psychiatrist, Dr. Dietrich Pates, but did discuss with Dr. Weber Cooks who stated that he did not think that either olanzapine or Paxil was well known for causing orthostatic hypotension problems, but felt that Paxil may be more likely then the respiratory or Zyprexa and might consider taking this in half if family is interested as well.  It sounds like we are going to go ahead and recommend decrease the Paxil by half down to one fourth tablet or 7.5 mg daily and leave the other medications the same for now. I am going to recommend that he follows up with primary care physician, as well as a cardiologist, as well as the gastrologist.  Patient  care transferred to Dr. Archie Balboa at shift change 3 PM, CT the abdomen pulses pending. If this is negative for acute finding, patient may be discharged with my prepared discharge instructions.    CONSULTATIONS:   None   Patient / Family / Caregiver informed of clinical course, medical decision-making process, and agree with plan.   I discussed return precautions, follow-up instructions, and discharged instructions with patient and/or family.   ___________________________________________   FINAL CLINICAL IMPRESSION(S) / ED DIAGNOSES   Final diagnoses:  Orthostatic hypotension  Facial hematoma, initial encounter  Multiple falls              Note: This dictation was prepared with Dragon dictation. Any transcriptional errors that result from this process are unintentional   Lisa Roca, MD 05/18/16 1524

## 2016-05-18 NOTE — ED Notes (Signed)
Patient presents to the ED post fall at 3am.  Patient states he was getting out of the bed to go to the kitchen.  Patient reports hitting mouth and left side.  Patient broke his dentures and hurt his lip.  Patient reports feeling dizzy since April 6th when he had an abdominal surgery due to a blockage.  Patient's family also reports that patient has had a large amount of foul smelling burps since the surgery along with dizziness and increased falls.  Patient's family was told by patient's doctor that blood work does show that patient is anemic.  Patient is supposed to follow-up with a GI doctor but has not yet.  Patient is alert and answering questions appropriately at this time.

## 2016-05-18 NOTE — ED Notes (Signed)
Pt resting in bed, family at bedside in no distress

## 2016-05-18 NOTE — Telephone Encounter (Signed)
Pt's sister Osborne Casco called to let Dr. Caryn Section know that pt is at the ER this morning b/c pt fell last night 05/17/16 and she is going to try to have someone in GI see pt at the hospital today. Thanks TNP

## 2016-05-19 ENCOUNTER — Inpatient Hospital Stay: Payer: Medicare PPO

## 2016-05-19 LAB — CBC
HCT: 31.5 % — ABNORMAL LOW (ref 40.0–52.0)
Hemoglobin: 10.1 g/dL — ABNORMAL LOW (ref 13.0–18.0)
MCH: 26.2 pg (ref 26.0–34.0)
MCHC: 32.1 g/dL (ref 32.0–36.0)
MCV: 81.4 fL (ref 80.0–100.0)
Platelets: 302 10*3/uL (ref 150–440)
RBC: 3.86 MIL/uL — ABNORMAL LOW (ref 4.40–5.90)
RDW: 14.8 % — AB (ref 11.5–14.5)
WBC: 8.6 10*3/uL (ref 3.8–10.6)

## 2016-05-19 LAB — BASIC METABOLIC PANEL
Anion gap: 5 (ref 5–15)
BUN: 14 mg/dL (ref 6–20)
CALCIUM: 8.1 mg/dL — AB (ref 8.9–10.3)
CO2: 27 mmol/L (ref 22–32)
Chloride: 106 mmol/L (ref 101–111)
Creatinine, Ser: 1.3 mg/dL — ABNORMAL HIGH (ref 0.61–1.24)
GFR calc Af Amer: >60 mL/min (ref >60)
GFR, EST NON AFRICAN AMERICAN: 56 mL/min — AB (ref >60)
GLUCOSE: 89 mg/dL (ref 65–99)
Potassium: 3.7 mmol/L (ref 3.5–5.1)
Sodium: 138 mmol/L (ref 135–145)

## 2016-05-19 MED ORDER — MIDODRINE HCL 5 MG PO TABS
10.0000 mg | ORAL_TABLET | Freq: Three times a day (TID) | ORAL | Status: DC
  Administered 2016-05-19 – 2016-05-20 (×3): 10 mg via ORAL
  Filled 2016-05-19 (×3): qty 2

## 2016-05-19 NOTE — Evaluation (Signed)
Physical Therapy Evaluation Patient Details Name: [name] MRN: [id number] DOB: Oct 19, 1951 Today's Date: 05/19/2016   History of Present Illness  Pt is a 65 y.o. male presenting to hospital with dizziness since April 6th 2017 SBO surgical repair.  Pt with recent fall and presenting with weakness.  Imaging shows acute cholecystitis.  PMH includes schizophrenia, anemia, CKD stage 2, edema, chronic nausea.  Clinical Impression  Prior to admission, pt was independent with functional mobility without AD.  Pt lives with his sister in 1 level home with stairs to enter.  Currently pt is CGA with sit to stand and modified independent sit to supine.  Deferred ambulation d/t pt's BP 97/59 sitting and decreased to 83/51 standing with pt reporting dizziness and pt becoming "shaky" and needing to sit back down again (nursing came during session and notified).  BP increased back to 100/64 upon laying down in bed.  Pt would benefit from skilled PT to address noted impairments and functional limitations during hospital stay.  Nursing reports pt has been walking to bathroom with SBA without AD.  Anticipate pt will be able to return home with 24/7 assist with functional mobility for safety when medically appropriate.     Follow Up Recommendations Supervision/Assistance - 24 hour (HHPT pending pt's progress)    Equipment Recommendations   (TBD)    Recommendations for Other Services       Precautions / Restrictions Precautions Precautions: Fall Precaution Comments: Orthostatic Restrictions Weight Bearing Restrictions: No      Mobility  Bed Mobility Overal bed mobility: Modified Independent Bed Mobility: Sit to Supine       Sit to supine: Modified independent (Device/Increase time);HOB elevated      Transfers Overall transfer level: Needs assistance Equipment used: None Transfers: Sit to/from Stand Sit to Stand: Min guard         General transfer comment: use of siderail to assist  with standing; increased effort to perform but able to perform without any physical assistance  Ambulation/Gait             General Gait Details: deferred d/t pt dizzy and shaking within 30 seconds of standing and BP 83/51  Stairs            Wheelchair Mobility    Modified Rankin (Stroke Patients Only)       Balance Overall balance assessment: Needs assistance Sitting-balance support: Bilateral upper extremity supported;Feet supported Sitting balance-Leahy Scale: Good     Standing balance support: Single extremity supported Standing balance-Leahy Scale: Fair Standing balance comment: pt became dizzy and shaky after standing requiring CGA for safety                             Pertinent Vitals/Pain Pain Assessment: No/denies pain  See flowsheet for vitals.    Home Living Family/patient expects to be discharged to:: Private residence Living Arrangements: Other relatives (Sister) Available Help at Discharge: Family Type of Home: House Home Access: Stairs to enter Entrance Stairs-Rails: Left Entrance Stairs-Number of Steps: 5 Home Layout: One level Home Equipment: None Additional Comments: Pt's sister is pt's guardian.    Prior Function Level of Independence: Independent         Comments: Pt's sister reports 1 fall prior to April surgery and one recent fall just prior to admission.     Hand Dominance        Extremity/Trunk Assessment   Upper Extremity Assessment: Generalized weakness  Lower Extremity Assessment: Generalized weakness         Communication   Communication: No difficulties  Cognition Arousal/Alertness: Awake/alert Behavior During Therapy: WFL for tasks assessed/performed Overall Cognitive Status: Within Functional Limits for tasks assessed                      General Comments General comments (skin integrity, edema, etc.): Pt sitting on edge of bed upon PT arrival with sister present.   Nursing cleared pt for participation in physical therapy.  Pt agreeable to PT session.    Exercises        Assessment/Plan    PT Assessment Patient needs continued PT services  PT Diagnosis Difficulty walking;Generalized weakness   PT Problem List Decreased strength;Decreased activity tolerance;Decreased balance;Decreased mobility  PT Treatment Interventions DME instruction;Gait training;Stair training;Functional mobility training;Therapeutic activities;Therapeutic exercise;Balance training;Patient/family education   PT Goals (Current goals can be found in the Care Plan section) Acute Rehab PT Goals Patient Stated Goal: to be less dizzy with walking PT Goal Formulation: With patient Time For Goal Achievement: 06/02/16 Potential to Achieve Goals: Fair    Frequency Min 2X/week   Barriers to discharge        Co-evaluation               End of Session Equipment Utilized During Treatment: Gait belt Activity Tolerance: Other (comment) (Limited d/t dizziness) Patient left: in bed;with call bell/phone within reach;with bed alarm set;with nursing/sitter in room;with family/visitor present Nurse Communication: Mobility status;Precautions (BP during session and symptoms)         Time: [id number] PT Time Calculation (min) (ACUTE ONLY): 19 min   Charges:   PT Evaluation $PT Eval Moderate Complexity: 1 Procedure     PT G CodesLeitha Bleak May 22, 2016, 9:21 AM Leitha Bleak, [place]

## 2016-05-19 NOTE — Care Management (Signed)
Admitted to [facility] with the diagnosis of cholecystitis. Lives with sister, Osborne Casco [phone]). No home health. No skilled facility. No home oxygen. Uses no aids for ambulation. Last fall was in April then again yesterday. Takes care of all basic activities of daily living himself, no drivers license. Sister takes care of all errands. Good appetite now. Gets prescriptions filled at [facility]. Seen Dr. Caryn Section twice last week. Sister will transport. Shelbie Ammons RN MSN CCM Care Management [phone]

## 2016-05-19 NOTE — Consult Note (Signed)
Surgical Consultation  05/19/2016  KARRY CAUSER is an 65 y.o. male.   CC: Right lateral abdominal pain  HPI: This patient with severe schizophrenia whose family members present and assisted with the interview. In the postoperative period 2 months ago from a small bowel obstruction by Dr. Dahlia Byes. Patient states today that his pain is better. His significant other states that he's been having pain since surgery and probably before surgery. She also is focused on his dizziness and falling which seems to be attributable to medications although the significant other does not believe that. Review of systems and further subjective not available from the patient  Past Medical History  Diagnosis Date  . Chicken pox   . Measles   . Mumps   . Dizziness   . Schizophrenia [facility])     Past Surgical History  Procedure Laterality Date  . None    . Laparotomy N/A 03/20/2016    Procedure: EXPLORATORY LAPAROTOMY with lysis of adhesions;  Surgeon: Jules Husbands, MD;  Location: [facility] ORS;  Service: General;  Laterality: N/A;  . Colon surgery    . Abdominal surgery      Family History  Problem Relation Age of Onset  . Diabetes Mother   . Heart disease Mother   . Stroke Mother   . Diabetes Father   . Hypertension Father   . Lung cancer Father   . Cancer Father     lung cancer  . Schizophrenia Sister   . Lung cancer Sister   . Colon cancer Sister     Social History:  reports that he quit smoking about 11 years ago. His smoking use included Cigarettes. He quit smokeless tobacco use about 11 years ago. He reports that he does not drink alcohol or use illicit drugs.  Allergies: No Known Allergies  Medications reviewed.   Review of Systems:   Review of Systems  Unable to perform ROS: mental acuity     Physical Exam:  BP 113/74 mmHg  Pulse 68  Temp(Src) 98.5 F (36.9 C) (Oral)  Resp 18  Ht '5\' 11"'  (1.803 m)  Wt 220 lb (99.791 kg)  BMI 30.70 kg/m2  SpO2 97%  Physical Exam   Constitutional: He is oriented to person, place, and time and well-developed, well-nourished, and in no distress.  HENT:  Head: Normocephalic and atraumatic.  Tongue movements  Eyes: Right eye exhibits no discharge. Left eye exhibits no discharge. No scleral icterus.  Neck: Normal range of motion.  Cardiovascular: Normal rate, regular rhythm and normal heart sounds.   Pulmonary/Chest: Effort normal. No respiratory distress. He has no wheezes.  Abdominal: Soft. He exhibits no distension. There is no tenderness. There is no rebound and no guarding.  No tenderness at this time with no Percell Miller sign Long midline abdominal scar no erythema or drainage  Musculoskeletal: Normal range of motion. He exhibits no edema.  Lymphadenopathy:    He has no cervical adenopathy.  Neurological: He is alert and oriented to person, place, and time.  Skin: Skin is warm and dry. No erythema.  Vitals reviewed.     Results for orders placed or performed during the hospital encounter of 05/18/16 (from the past 48 hour(s))  Basic metabolic panel     Status: Abnormal   Collection Time: 05/18/16  8:55 AM  Result Value Ref Range   Sodium 139 135 - 145 mmol/L   Potassium 4.0 3.5 - 5.1 mmol/L   Chloride 104 101 - 111 mmol/L   CO2 26  22 - 32 mmol/L   Glucose, Bld 115 (H) 65 - 99 mg/dL   BUN 21 (H) 6 - 20 mg/dL   Creatinine, Ser 1.73 (H) 0.61 - 1.24 mg/dL   Calcium 9.0 8.9 - 10.3 mg/dL   GFR calc non Af Amer 39 (L) >60 mL/min   GFR calc Af Amer 46 (L) >60 mL/min    Comment: (NOTE) The eGFR has been calculated using the CKD EPI equation. This calculation has not been validated in all clinical situations. eGFR's persistently <60 mL/min signify possible Chronic Kidney Disease.    Anion gap 9 5 - 15  CBC     Status: Abnormal   Collection Time: 05/18/16  8:55 AM  Result Value Ref Range   WBC 10.4 3.8 - 10.6 K/uL   RBC 4.13 (L) 4.40 - 5.90 MIL/uL   Hemoglobin 11.0 (L) 13.0 - 18.0 g/dL   HCT 33.8 (L) 40.0 -  52.0 %   MCV 81.7 80.0 - 100.0 fL   MCH 26.7 26.0 - 34.0 pg   MCHC 32.7 32.0 - 36.0 g/dL   RDW 14.8 (H) 11.5 - 14.5 %   Platelets 342 150 - 440 K/uL  Urinalysis complete, with microscopic     Status: Abnormal   Collection Time: 05/18/16  8:55 AM  Result Value Ref Range   Color, Urine YELLOW (A) YELLOW   APPearance CLEAR (A) CLEAR   Glucose, UA NEGATIVE NEGATIVE mg/dL   Bilirubin Urine NEGATIVE NEGATIVE   Ketones, ur NEGATIVE NEGATIVE mg/dL   Specific Gravity, Urine 1.018 1.005 - 1.030   Hgb urine dipstick NEGATIVE NEGATIVE   pH 7.0 5.0 - 8.0   Protein, ur 30 (A) NEGATIVE mg/dL   Nitrite NEGATIVE NEGATIVE   Leukocytes, UA NEGATIVE NEGATIVE   RBC / HPF 0-5 0 - 5 RBC/hpf   WBC, UA 0-5 0 - 5 WBC/hpf   Bacteria, UA NONE SEEN NONE SEEN   Squamous Epithelial / LPF NONE SEEN NONE SEEN   Mucous PRESENT   Troponin I     Status: None   Collection Time: 05/18/16  8:55 AM  Result Value Ref Range   Troponin I <0.03 <0.031 ng/mL    Comment:        NO INDICATION OF MYOCARDIAL INJURY.   Hepatic function panel     Status: Abnormal   Collection Time: 05/18/16  8:55 AM  Result Value Ref Range   Total Protein 7.5 6.5 - 8.1 g/dL   Albumin 3.3 (L) 3.5 - 5.0 g/dL   AST 31 15 - 41 U/L   ALT 27 17 - 63 U/L   Alkaline Phosphatase 85 38 - 126 U/L   Total Bilirubin 0.2 (L) 0.3 - 1.2 mg/dL   Bilirubin, Direct <0.1 (L) 0.1 - 0.5 mg/dL   Indirect Bilirubin NOT CALCULATED 0.3 - 0.9 mg/dL  Lipase, blood     Status: None   Collection Time: 05/18/16  8:55 AM  Result Value Ref Range   Lipase 23 11 - 51 U/L  Magnesium     Status: None   Collection Time: 05/18/16  8:55 AM  Result Value Ref Range   Magnesium 2.2 1.7 - 2.4 mg/dL  Basic metabolic panel     Status: Abnormal   Collection Time: 05/19/16  6:50 AM  Result Value Ref Range   Sodium 138 135 - 145 mmol/L   Potassium 3.7 3.5 - 5.1 mmol/L   Chloride 106 101 - 111 mmol/L   CO2 27 22 - 32 mmol/L  Glucose, Bld 89 65 - 99 mg/dL   BUN 14 6 - 20  mg/dL   Creatinine, Ser 1.30 (H) 0.61 - 1.24 mg/dL   Calcium 8.1 (L) 8.9 - 10.3 mg/dL   GFR calc non Af Amer 56 (L) >60 mL/min   GFR calc Af Amer >60 >60 mL/min    Comment: (NOTE) The eGFR has been calculated using the CKD EPI equation. This calculation has not been validated in all clinical situations. eGFR's persistently <60 mL/min signify possible Chronic Kidney Disease.    Anion gap 5 5 - 15  CBC     Status: Abnormal   Collection Time: 05/19/16  6:50 AM  Result Value Ref Range   WBC 8.6 3.8 - 10.6 K/uL   RBC 3.86 (L) 4.40 - 5.90 MIL/uL   Hemoglobin 10.1 (L) 13.0 - 18.0 g/dL   HCT 31.5 (L) 40.0 - 52.0 %   MCV 81.4 80.0 - 100.0 fL   MCH 26.2 26.0 - 34.0 pg   MCHC 32.1 32.0 - 36.0 g/dL   RDW 14.8 (H) 11.5 - 14.5 %   Platelets 302 150 - 440 K/uL   Ct Head Wo Contrast  05/18/2016  CLINICAL DATA:  Dizziness and confusion after fall. EXAM: CT HEAD WITHOUT CONTRAST TECHNIQUE: Contiguous axial images were obtained from the base of the skull through the vertex without intravenous contrast. COMPARISON:  March 18, 2016 FINDINGS: Mild generalized atrophy is stable. There is no intracranial mass, hemorrhage, extra-axial fluid collection, or midline shift. Gray-white compartments appear unremarkable. No focal gray-white compartment lesions are identified. No acute infarct evident. Bony calvarium appears intact. The mastoid air cells are clear. No intraorbital lesions are evident. IMPRESSION: Atrophy, stable. No intracranial mass, hemorrhage, or extra-axial fluid collection. No focal gray - white compartment lesions. Electronically Signed   By: Lowella Grip III M.D.   On: 05/18/2016 09:25   Ct Abdomen Pelvis W Contrast  05/18/2016  CLINICAL DATA:  Patient had recent surgery for repair of small bowel obstruction. Patient has abdominal distention vomiting currently. EXAM: CT ABDOMEN AND PELVIS WITH CONTRAST TECHNIQUE: Multidetector CT imaging of the abdomen and pelvis was performed using the  standard protocol following bolus administration of intravenous contrast. CONTRAST:  63m ISOVUE-300 IOPAMIDOL (ISOVUE-300) INJECTION 61% COMPARISON:  March 18, 2016 FINDINGS: Lower chest:  No acute findings. Hepatobiliary: There is an inflammation around the gallbladder with gallbladder wall thickening and a 5 mm stone in the gallbladder neck. There is edema surrounding the gallbladder. The liver is normal. Pancreas: No mass, inflammatory changes, or other significant abnormality. Spleen: Within normal limits in size and appearance. Adrenals/Urinary Tract: The adrenal glands and kidneys are normal. The bladder is normal. No masses identified. No evidence of hydronephrosis. Stomach/Bowel: There is mild thickening of bowel wall of the first and second portion duodenum likely related to the adjacent inflammation of the gallbladder. No evidence of obstruction, or abnormal fluid collections. The appendix is normal. Vascular/Lymphatic: No pathologically enlarged lymph nodes. No evidence of abdominal aortic aneurysm. There is atherosclerosis of the abdominal aorta. Reproductive: No mass or other significant abnormality. Other: Small free fluid is identified in the pelvis. There are small bilateral inguinal herniation of mesenteric fat. Musculoskeletal: Degenerative joint changes of the spine are identified. IMPRESSION: Findings consistent with acute cholecystitis. Mild thickened bowel wall of the first and second portion of duodenum likely related to adjacent inflammation of the gallbladder. Small free fluid in the pelvis. Electronically Signed   By: WAbelardo DieselM.D.   On:  05/18/2016 17:01   US Abdomen Limited Ruq  05/19/2016  CLINICAL DATA:  Abdominal pain, nausea and vomiting starting yesterday EXAM: US ABDOMEN LIMITED - RIGHT UPPER QUADRANT COMPARISON:  None. FINDINGS: Gallbladder: There is thickening of gallbladder wall and gallbladder wall edema. Measures 1 cm in thickness. Findings highly suspicious for acute  cholecystitis. There is non mobile gallstone in gallbladder neck region measures 5 mm. Common bile duct: Diameter: 3.1 mm in diameter within normal limits. Liver: No focal lesion identified. Within normal limits in parenchymal echogenicity. IMPRESSION: 1. There is significant thickening of gallbladder wall and edema of gallbladder wall highly suspicious for acute cholecystitis. Nonmobile gallstone in gallbladder neck region measures 5 mm. Normal CBD measures 3 mm in diameter. Unremarkable liver. Electronically Signed   By: Lahoma Crocker M.D.   On: 05/19/2016 10:46    Assessment/Plan:  CT and ultrasound reviewed. Labs are reviewed. Normal white blood cell count. I see no signs of a Murphy sign at this point although he has had signs of acute cholecystitis on ultrasound and CT scan subjectively and objectively he seems to be improved. I suggested that an open cholecystectomy would be necessary should his symptoms worsen and that laparoscopic approach may not be safe in this patient with recent laparotomy for bowel obstruction. I discussed my rationale for not offering surgery at this point unless the patient were to worsen. The patient's significant other was not happy with that decision and believes that his gallbladder needs to be removed because he is dizzy and falls frequently I reminded her that the gallbladder is not responsible for his dizziness nor his falls and that additional workup etc. it is warranted to be done by the primary care team including internal medicine and psychiatry. At this point I would not offer surgery but that could be altered based on his condition and any further treatment of his orthostatic hypotension. Patient's SO did not seem very happy with this decision. She stated she wants his gallbladder out because he has gallstones.  Florene Glen, MD, FACS

## 2016-05-19 NOTE — Care Management Important Message (Signed)
Important Message  Patient Details  Name: [name] MRN: [id number] Date of Birth: [birth year]   Medicare Important Message Given:  Yes    Shelbie Ammons, RN 05/19/2016, 9:00 AM

## 2016-05-19 NOTE — Progress Notes (Signed)
Initial Nutrition Assessment  DOCUMENTATION CODES:   Not applicable  INTERVENTION:   Await diet advancement as medically able; RD notes surgical evaluation pending Recommend dysphagia III diet order when able to be advanced as pt is without dentures Pt may benefit from supplement on follow if intake inadequate or diet order unable to be advanced   NUTRITION DIAGNOSIS:   Inadequate oral intake related to acute illness, poor appetite as evidenced by per patient/family report.  GOAL:   Patient will meet greater than or equal to 90% of their needs  MONITOR:   PO intake, Diet advancement, Labs, Weight trends, I & O's  REASON FOR ASSESSMENT:   Malnutrition Screening Tool    ASSESSMENT:   Pt admitted with with fall and orthostatic hypotension, noted to have acute cholecystitis with acute on chronic renal failure.   Past Medical History  Diagnosis Date  . Chicken pox   . Measles   . Mumps   . Dizziness   . Schizophrenia [facility])    Past Surgical History  Procedure Laterality Date  . None    . Laparotomy N/A 03/20/2016    Procedure: EXPLORATORY LAPAROTOMY with lysis of adhesions;  Surgeon: Jules Husbands, MD;  Location: [facility] ORS;  Service: General;  Laterality: N/A;  . Colon surgery    . Abdominal surgery     Diet Order:  Diet clear liquid Room service appropriate?: Yes; Fluid consistency:: Thin   Pt reports having CL this afternoon and tolerating very well; prefers vegetable broth  Pt's family reports pt has had nausea and diarrhea for 2 weeks with reduced po intake. Pt reports having dentures that he normally wears however they broke when he fell. Pt reports he was eating things like mac and cheese, hotdogs, hamburgers, eggs PTA, no supplements.  Medications: NS at 133mL/hr Labs: Cre 1.30  Gastrointestinal Profile: Last BM:  05/16/2016   Nutrition-Focused Physical Exam Findings: Nutrition-Focused physical exam completed. Findings are WDL for fat depletion, muscle  depletion, and edema.    Weight Change: Pt reports weight of 222lbs a few days ago at MD outpatient office and that he was weighing 230lbs before his surgery in April (4% weight loss in 2 months)   Skin:  Reviewed, no issues   Height:   Ht Readings from Last 1 Encounters:  05/18/16 5\' 11"  (1.803 m)    Weight:   Wt Readings from Last 1 Encounters:  05/18/16 220 lb (99.791 kg)   Wt Readings from Last 10 Encounters:  05/18/16 220 lb (99.791 kg)  05/14/16 222 lb (100.699 kg)  05/04/16 230 lb 6.4 oz (104.509 kg)  05/04/16 228 lb (103.42 kg)  04/05/16 232 lb (105.235 kg)  03/31/16 240 lb (108.863 kg)  03/18/16 230 lb (104.327 kg)  02/03/16 247 lb 3.2 oz (112.129 kg)  11/03/15 247 lb (112.038 kg)  08/05/15 241 lb 3.2 oz (109.408 kg)   BMI:  Body mass index is 30.7 kg/(m^2).   Estimated Nutritional Needs:   Kcal:  2360-2700kcals  Protein:  99-120g protein  Fluid:  >/= 2L fluid  EDUCATION NEEDS:   Education needs no appropriate at this time  Dwyane Luo, RD, LDN Pager [phone] Weekend/On-Call Pager [phone]

## 2016-05-19 NOTE — Plan of Care (Signed)
Problem: Safety: Goal: Ability to remain free from injury will improve Outcome: Progressing In am positive orthostatic BP with dizziness during physical therapy. MD notified. Midodrine initiated this evening. Pt up to the bathroom with 1xassist during the shift. Denies further dizziness.  Problem: Pain Managment: Goal: General experience of comfort will improve Outcome: Progressing Pt denies n/v/abd pain. Tolerates clear liquid diet.  Problem: Bowel/Gastric: Goal: Will not experience complications related to bowel motility Outcome: Progressing Pt had 1x loose stool throughout the shift.

## 2016-05-19 NOTE — Progress Notes (Signed)
[place] [facility]                                                                                                                                                                                            Patient Demographics   [name], is a 65 y.o. male, DOB - 10-28-1951, YD:[id number]  Admit date - 05/18/2016   Admitting Physician Demetrios Loll, MD  Outpatient Primary MD for the patient is Lelon Huh, MD   LOS - 1  Subjective: Patient admitted with nausea vomiting and right upper quadrant pain He also had episode of syncope noted to orthostatic hypotension. According to the family patient's been having orthostatic hypotension even prior to his bowel surgery. He has a poor historian   Review of Systems:   CONSTITUTIONAL: No documented fever. No fatigue, weakness. No weight gain, no weight loss.  EYES: No blurry or double vision.  ENT: No tinnitus. No postnasal drip. No redness of the oropharynx.  RESPIRATORY: No cough, no wheeze, no hemoptysis. No dyspnea.  CARDIOVASCULAR: No chest pain. No orthopnea. No palpitations. No syncope.  GASTROINTESTINAL: No nausea, no vomiting or diarrhea. No abdominal pain. No melena or hematochezia.  GENITOURINARY: No dysuria or hematuria.  ENDOCRINE: No polyuria or nocturia. No heat or cold intolerance.  HEMATOLOGY: No anemia. No bruising. No bleeding.  INTEGUMENTARY: No rashes. No lesions.  MUSCULOSKELETAL: No arthritis. No swelling. No gout.  NEUROLOGIC: No numbness, tingling, or ataxia. No seizure-type activity. Dizziness with standing PSYCHIATRIC: No anxiety. No insomnia. No ADD.    Vitals:   Filed Vitals:   05/18/16 2030 05/18/16 2115 05/19/16 0516 05/19/16 0850  BP: 134/82 138/79 125/73   Pulse: 77 82 79   Temp:  97.4 F (36.3 C) 99 F (37.2 C)   TempSrc:  Oral Oral   Resp: 17  18   Height:  5\' 11"  (1.803 m)    Weight:  99.791 kg (220 lb)    SpO2: 97% 100% 94% 97%    Wt Readings from  Last 3 Encounters:  05/18/16 99.791 kg (220 lb)  05/14/16 100.699 kg (222 lb)  05/04/16 104.509 kg (230 lb 6.4 oz)     Intake/Output Summary (Last 24 hours) at 05/19/16 1316 Last data filed at 05/19/16 0519  Gross per 24 hour  Intake 918.34 ml  Output    450 ml  Net 468.34 ml    Physical Exam:   GENERAL: Pleasant-appearing in no apparent distress.  HEAD, EYES, EARS, NOSE AND THROAT: Atraumatic, normocephalic. Extraocular muscles are intact. Pupils equal and reactive to light. Sclerae anicteric. No conjunctival injection. No oro-pharyngeal  erythema.  NECK: Supple. There is no jugular venous distention. No bruits, no lymphadenopathy, no thyromegaly.  HEART: Regular rate and rhythm,. No murmurs, no rubs, no clicks.  LUNGS: Clear to auscultation bilaterally. No rales or rhonchi. No wheezes.  ABDOMEN: Soft, flat, nontender, nondistended. Has good bowel sounds. No hepatosplenomegaly appreciated.  EXTREMITIES: No evidence of any cyanosis, clubbing, or peripheral edema.  +2 pedal and radial pulses bilaterally.  NEUROLOGIC: The patient is alert, awake, and oriented x3 with no focal motor or sensory deficits appreciated bilaterally.  SKIN: Moist and warm with no rashes appreciated.  Psych: Not anxious, depressed LN: No inguinal LN enlargement    Antibiotics   Anti-infectives    Start     Dose/Rate Route Frequency Ordered Stop   05/18/16 2145  cefTRIAXone (ROCEPHIN) 1 g in dextrose 5 % 50 mL IVPB     1 g 100 mL/hr over 30 Minutes Intravenous Every 24 hours 05/18/16 2132        Medications   Scheduled Meds: . cefTRIAXone (ROCEPHIN)  IV  1 g Intravenous Q24H  . clonazePAM  1 mg Oral QHS  . heparin  5,000 Units Subcutaneous Q8H  . OLANZapine  15 mg Oral QHS  . PARoxetine  15 mg Oral QHS  . polyethylene glycol  17 g Oral QODAY  . risperidone  4 mg Oral QHS   Continuous Infusions: . sodium chloride 100 mL/hr at 05/19/16 0508   PRN Meds:.acetaminophen **OR** acetaminophen,  albuterol, alum & mag hydroxide-simeth, morphine injection, ondansetron **OR** ondansetron (ZOFRAN) IV, oxyCODONE-acetaminophen   Data Review:   Micro Results No results found for this or any previous visit (from the past 240 hour(s)).  Radiology Reports Ct Head Wo Contrast  05/18/2016  CLINICAL DATA:  Dizziness and confusion after fall. EXAM: CT HEAD WITHOUT CONTRAST TECHNIQUE: Contiguous axial images were obtained from the base of the skull through the vertex without intravenous contrast. COMPARISON:  March 18, 2016 FINDINGS: Mild generalized atrophy is stable. There is no intracranial mass, hemorrhage, extra-axial fluid collection, or midline shift. Gray-white compartments appear unremarkable. No focal gray-white compartment lesions are identified. No acute infarct evident. Bony calvarium appears intact. The mastoid air cells are clear. No intraorbital lesions are evident. IMPRESSION: Atrophy, stable. No intracranial mass, hemorrhage, or extra-axial fluid collection. No focal gray - white compartment lesions. Electronically Signed   By: Lowella Grip III M.D.   On: 05/18/2016 09:25   Ct Abdomen Pelvis W Contrast  05/18/2016  CLINICAL DATA:  Patient had recent surgery for repair of small bowel obstruction. Patient has abdominal distention vomiting currently. EXAM: CT ABDOMEN AND PELVIS WITH CONTRAST TECHNIQUE: Multidetector CT imaging of the abdomen and pelvis was performed using the standard protocol following bolus administration of intravenous contrast. CONTRAST:  86mL ISOVUE-300 IOPAMIDOL (ISOVUE-300) INJECTION 61% COMPARISON:  March 18, 2016 FINDINGS: Lower chest:  No acute findings. Hepatobiliary: There is an inflammation around the gallbladder with gallbladder wall thickening and a 5 mm stone in the gallbladder neck. There is edema surrounding the gallbladder. The liver is normal. Pancreas: No mass, inflammatory changes, or other significant abnormality. Spleen: Within normal limits in size and  appearance. Adrenals/Urinary Tract: The adrenal glands and kidneys are normal. The bladder is normal. No masses identified. No evidence of hydronephrosis. Stomach/Bowel: There is mild thickening of bowel wall of the first and second portion duodenum likely related to the adjacent inflammation of the gallbladder. No evidence of obstruction, or abnormal fluid collections. The appendix is normal. Vascular/Lymphatic: No pathologically enlarged  lymph nodes. No evidence of abdominal aortic aneurysm. There is atherosclerosis of the abdominal aorta. Reproductive: No mass or other significant abnormality. Other: Small free fluid is identified in the pelvis. There are small bilateral inguinal herniation of mesenteric fat. Musculoskeletal: Degenerative joint changes of the spine are identified. IMPRESSION: Findings consistent with acute cholecystitis. Mild thickened bowel wall of the first and second portion of duodenum likely related to adjacent inflammation of the gallbladder. Small free fluid in the pelvis. Electronically Signed   By: Abelardo Diesel M.D.   On: 05/18/2016 17:01   Dg Abd 2 Views  05/14/2016  CLINICAL DATA:  Two days of vomiting and diarrhea with dizziness. Recent surgery for bowel obstruction 1 month ago. EXAM: ABDOMEN - 2 VIEW COMPARISON:  Abdominal series of March 31, 2016 FINDINGS: No small or large bowel dilation is observed. There is gas and fluid in the stomach. There is some gas in the right colon. Scattered tiny gas collections in the small bowel were noted. There is no stool or gas in the rectum. The colonic stool burden is moderate. The lung bases are clear. There are degenerative changes at multiple lumbar disc levels IMPRESSION: No plain radiographic evidence of perforation or small or large bowel obstruction or ileus. There does appear to be mild distention of the stomach with fluid and gas. Electronically Signed   By: David  Martinique M.D.   On: 05/14/2016 14:53   US Abdomen Limited  Ruq  05/19/2016  CLINICAL DATA:  Abdominal pain, nausea and vomiting starting yesterday EXAM: US ABDOMEN LIMITED - RIGHT UPPER QUADRANT COMPARISON:  None. FINDINGS: Gallbladder: There is thickening of gallbladder wall and gallbladder wall edema. Measures 1 cm in thickness. Findings highly suspicious for acute cholecystitis. There is non mobile gallstone in gallbladder neck region measures 5 mm. Common bile duct: Diameter: 3.1 mm in diameter within normal limits. Liver: No focal lesion identified. Within normal limits in parenchymal echogenicity. IMPRESSION: 1. There is significant thickening of gallbladder wall and edema of gallbladder wall highly suspicious for acute cholecystitis. Nonmobile gallstone in gallbladder neck region measures 5 mm. Normal CBD measures 3 mm in diameter. Unremarkable liver. Electronically Signed   By: Lahoma Crocker M.D.   On: 05/19/2016 10:46     CBC  Recent Labs Lab 05/14/16 1149 05/18/16 0855 05/19/16 0650  WBC 13.2* 10.4 8.6  HGB  --  11.0* 10.1*  HCT 32.2* 33.8* 31.5*  PLT 427* 342 302  MCV 82 81.7 81.4  MCH 26.2* 26.7 26.2  MCHC 32.0 32.7 32.1  RDW 14.0 14.8* 14.8*    Chemistries   Recent Labs Lab 05/18/16 0855 05/19/16 0650  NA 139 138  K 4.0 3.7  CL 104 106  CO2 26 27  GLUCOSE 115* 89  BUN 21* 14  CREATININE 1.73* 1.30*  CALCIUM 9.0 8.1*  MG 2.2  --   AST 31  --   ALT 27  --   ALKPHOS 85  --   BILITOT 0.2*  --    ------------------------------------------------------------------------------------------------------------------ estimated creatinine clearance is 67.3 mL/min (by C-G formula based on Cr of 1.3). ------------------------------------------------------------------------------------------------------------------ No results for input(s): HGBA1C in the last 72 hours. ------------------------------------------------------------------------------------------------------------------ No results for input(s): CHOL, HDL, LDLCALC, TRIG,  CHOLHDL, LDLDIRECT in the last 72 hours. ------------------------------------------------------------------------------------------------------------------ No results for input(s): TSH, T4TOTAL, T3FREE, THYROIDAB in the last 72 hours.  Invalid input(s): FREET3 ------------------------------------------------------------------------------------------------------------------ No results for input(s): VITAMINB12, FOLATE, FERRITIN, TIBC, IRON, RETICCTPCT in the last 72 hours.  Coagulation profile No results for input(s):  INR, PROTIME in the last 168 hours.  No results for input(s): DDIMER in the last 72 hours.  Cardiac Enzymes  Recent Labs Lab 05/18/16 0855  TROPONINI <0.03   ------------------------------------------------------------------------------------------------------------------ Invalid input(s): POCBNP    Assessment & Plan  Patient is a 65 year old white male presenting with fall orthostatic hypotension noted to have acute cholecystitis  1. Acute cholecystitis. Continue IV antibiotics I had don't see a surgical note will ask him to see  2. Acute renal failure on CKD stage 2. Continue IV fluids renal function not improve  3. Hypotension. Improved with IV fluids  4. Orthostatic hypotension I will start patient on TED hose as well as  Midodrine  5. Schizophrenia. Continue clonzepam     Code Status Orders        Start     Ordered   05/18/16 2133  Full code   Continuous     05/18/16 2132    Code Status History    Date Active Date Inactive Code Status Order ID Comments User Context   03/18/2016  1:51 PM 03/26/2016  3:17 PM Full Code [id number]  Diego Sarita Haver, MD ED    Advance Directive Documentation        Most Recent Value   Type of Advance Directive  Healthcare Power of [place], Living will   Pre-existing out of facility DNR order (yellow form or pink MOST form)     "MOST" Form in Place?             Consults  surgery   DVT Prophylaxis  Lovenox     Lab Results  Component Value Date   PLT 302 05/19/2016     Time Spent in minutes   87min  Greater than 50% of time spent in care coordination and counseling patient regarding the condition and plan of care.   Dustin Flock M.D on 05/19/2016 at 1:16 PM  Between 7am to 6pm - Pager - [phone]  After 6pm go to [email or web address] - password [email or web address] [facility]   Office  [phone]

## 2016-05-20 ENCOUNTER — Telehealth: Payer: Self-pay | Admitting: Family Medicine

## 2016-05-20 DIAGNOSIS — K819 Cholecystitis, unspecified: Secondary | ICD-10-CM

## 2016-05-20 LAB — BASIC METABOLIC PANEL
ANION GAP: 7 (ref 5–15)
BUN: 11 mg/dL (ref 6–20)
CALCIUM: 8.2 mg/dL — AB (ref 8.9–10.3)
CHLORIDE: 109 mmol/L (ref 101–111)
CO2: 25 mmol/L (ref 22–32)
CREATININE: 1.17 mg/dL (ref 0.61–1.24)
GFR calc non Af Amer: >60 mL/min (ref >60)
Glucose, Bld: 85 mg/dL (ref 65–99)
Potassium: 4 mmol/L (ref 3.5–5.1)
SODIUM: 141 mmol/L (ref 135–145)

## 2016-05-20 MED ORDER — MIDODRINE HCL 10 MG PO TABS: 10.0000 mg | ORAL_TABLET | Freq: Three times a day (TID) | ORAL | Status: DC

## 2016-05-20 MED ORDER — CEPHALEXIN 500 MG PO CAPS: 500.0000 mg | ORAL_CAPSULE | Freq: Three times a day (TID) | ORAL | Status: DC

## 2016-05-20 MED ORDER — CETYLPYRIDINIUM CHLORIDE 0.05 % MT LIQD
7.0000 mL | Freq: Two times a day (BID) | OROMUCOSAL | Status: DC
  Administered 2016-05-20: 7 mL via OROMUCOSAL

## 2016-05-20 MED ORDER — CHLORHEXIDINE GLUCONATE 0.12 % MT SOLN
15.0000 mL | Freq: Two times a day (BID) | OROMUCOSAL | Status: DC
  Administered 2016-05-20: 15 mL via OROMUCOSAL
  Filled 2016-05-20: qty 15

## 2016-05-20 NOTE — Discharge Instructions (Signed)
You were evaluated after a fall, and no serious injury is suspected. Lip bruise and small internal laceration on the lip will heal on its own.  No certain cause was found as to why his blood pressure has been dropping, but we discussed to go ahead and decrease the Paxil by half, down to one fourth of a tablet or 7.5 mg daily.  Discussed with your psychiatrist any changes in mood and whether or not another choice would be better with his blood pressure.  I am recommending that you follow up with her primary care physician as well as follow-up with a cardiologist for consideration of possible echocardiogram, and Dr. Laurelyn Sickle office numbers provided.  Still make your follow-up with the gastroenterologist as referred by your primary care physician.  Return to the emergency room for any worsening condition including any new traumatic injury, abdominal pain, fever, confusion or altered mental status, dizziness or passing out, or any other symptoms concerning to you.   Facial or Scalp Contusion A facial or scalp contusion is a deep bruise on the face or head. Injuries to the face and head generally cause a lot of swelling, especially around the eyes. Contusions are the result of an injury that caused bleeding under the skin. The contusion may turn blue, purple, or yellow. Minor injuries will give you a painless contusion, but more severe contusions may stay painful and swollen for a few weeks.  CAUSES  A facial or scalp contusion is caused by a blunt injury or trauma to the face or head area.  SIGNS AND SYMPTOMS   Swelling of the injured area.   Discoloration of the injured area.   Tenderness, soreness, or pain in the injured area.  DIAGNOSIS  The diagnosis can be made by taking a medical history and doing a physical exam. An X-ray exam, CT scan, or MRI may be needed to determine if there are any associated injuries, such as broken bones (fractures). TREATMENT  Often, the best treatment for a  facial or scalp contusion is applying cold compresses to the injured area. Over-the-counter medicines may also be recommended for pain control.  HOME CARE INSTRUCTIONS   Only take over-the-counter or prescription medicines as directed by your health care provider.   Apply ice to the injured area.   Put ice in a plastic bag.   Place a towel between your skin and the bag.   Leave the ice on for 20 minutes, 2-3 times a day.  SEEK MEDICAL CARE IF:  You have bite problems.   You have pain with chewing.   You are concerned about facial defects. SEEK IMMEDIATE MEDICAL CARE IF:  You have severe pain or a headache that is not relieved by medicine.   You have unusual sleepiness, confusion, or personality changes.   You throw up (vomit).   You have a persistent nosebleed.   You have double vision or blurred vision.   You have fluid drainage from your nose or ear.   You have difficulty walking or using your arms or legs.  MAKE SURE YOU:   Understand these instructions.  Will watch your condition.  Will get help right away if you are not doing well or get worse.   This information is not intended to replace advice given to you by your health care provider. Make sure you discuss any questions you have with your health care provider.   Document Released: 01/06/2005 Document Revised: 12/20/2014 Document Reviewed: 07/12/2013 Elsevier Interactive Patient Education 2016 [employer]  [employer]  Hypotension As your heart beats, it forces blood through your arteries. This force is your blood pressure. If your blood pressure is too low for you to go about your normal activities or to support the organs of your body, you have hypotension. Hypotension is also referred to as low blood pressure. When your blood pressure becomes too low, you may not get enough blood to your brain. As a result, you may feel weak, feel lightheaded, or develop a rapid heart rate. In a more severe case, you may  faint. CAUSES Various conditions can cause hypotension. These include:  Blood loss.  Dehydration.  Heart or endocrine problems.  Pregnancy.  Severe infection.  Not having a well-balanced diet filled with needed nutrients.  Severe allergic reactions (anaphylaxis). Some medicines, such as blood pressure medicine or water pills (diuretics), may lower your blood pressure below normal. Sometimes taking too much medicine or taking medicine not as directed can cause hypotension. TREATMENT  Hospitalization is sometimes required for hypotension if fluid or blood replacement is needed, if time is needed for medicines to wear off, or if further monitoring is needed. Treatment might include changing your diet, changing your medicines (including medicines aimed at raising your blood pressure), and use of support stockings. HOME CARE INSTRUCTIONS   Drink enough fluids to keep your urine clear or pale yellow.  Take your medicines as directed by your health care provider.  Get up slowly from reclining or sitting positions. This gives your blood pressure a chance to adjust.  Wear support stockings as directed by your health care provider.  Maintain a healthy diet by including nutritious food, such as fruits, vegetables, nuts, whole grains, and lean meats. SEEK MEDICAL CARE IF:  You have vomiting or diarrhea.  You have a fever for more than 2-3 days.  You feel more thirsty than usual.  You feel weak and tired. SEEK IMMEDIATE MEDICAL CARE IF:   You have chest pain or a fast or irregular heartbeat.  You have a loss of feeling in some part of your body, or you lose movement in your arms or legs.  You have trouble speaking.  You become sweaty or feel lightheaded.  You faint. MAKE SURE YOU:   Understand these instructions.  Will watch your condition.  Will get help right away if you are not doing well or get worse.   This information is not intended to replace advice given to you  by your health care provider. Make sure you discuss any questions you have with your health care provider.   Document Released: 11/29/2005 Document Revised: 09/19/2013 Document Reviewed: 06/01/2013 Elsevier Interactive Patient Education 2016 [place] fat diet. Activity as tolerated.

## 2016-05-20 NOTE — Telephone Encounter (Signed)
Pt is being discharged form [facility] today for Cholecystitis.  I have scheduled a hospital follow up/MW

## 2016-05-20 NOTE — Discharge Summary (Addendum)
[facility] NAME: [name]    MR#:  [id number]  DATE OF BIRTH:  10/27/1951  DATE OF ADMISSION:  05/18/2016 ADMITTING PHYSICIAN: Demetrios Loll, MD  DATE OF DISCHARGE:  05/20/2016 PRIMARY CARE PHYSICIAN: Lelon Huh, MD    ADMISSION DIAGNOSIS:  Orthostatic hypotension [I95.1] Multiple falls [R29.6] Facial hematoma, initial encounter [S00.83XA]   DISCHARGE DIAGNOSIS:   Acute cholecystitis Acute renal failure on CKD stage 2 Orthostatic hypotension SECONDARY DIAGNOSIS:   Past Medical History  Diagnosis Date  . Chicken pox   . Measles   . Mumps   . Dizziness   . Schizophrenia [facility])     HOSPITAL COURSE:   Patient is a 65 year old white male presenting with fall orthostatic hypotension noted to have acute cholecystitis  1. Acute cholecystitis. Continue rocephin, change to po keflex. No symptoms. I had don't see a surgical note will ask him to see Per Dr. Burt Knack, His gallbladder may need to be removed at some point and that can be done as an outpatient, follow-up with Dr. Perrin Maltese in 2 weeks.  2. Acute renal failure on CKD stage 2. Improved with IV fluids.  3. Hypotension. Improved with IV fluids  4. Orthostatic hypotension, improved with Midodrine.  5. Schizophrenia. Continue clonzepam   His sister refused HHPT.  DISCHARGE CONDITIONS:   Stable, discharged to home today.  CONSULTS OBTAINED:  Treatment Team:  Florene Glen, MD  DRUG ALLERGIES:  No Known Allergies  DISCHARGE MEDICATIONS:   Current Discharge Medication List    START taking these medications   Details  cephALEXin (KEFLEX) 500 MG capsule Take 1 capsule (500 mg total) by mouth 3 (three) times daily. Qty: 21 capsule, Refills: 0    midodrine (PROAMATINE) 10 MG tablet Take 1 tablet (10 mg total) by mouth 3 (three) times daily with meals. Qty: 90 tablet, Refills: 0      CONTINUE these medications which have NOT CHANGED   Details   alum & mag hydroxide-simeth (MAALOX/MYLANTA) 200-200-20 MG/5ML suspension Take 30 mLs by mouth every 6 (six) hours as needed for indigestion or heartburn.    clonazePAM (KLONOPIN) 1 MG tablet Take 1 tablet (1 mg total) by mouth at bedtime. Qty: 30 tablet, Refills: 3    OLANZapine (ZYPREXA) 15 MG tablet Take 15 mg by mouth at bedtime.    ondansetron (ZOFRAN) 4 MG tablet Take 1 tablet (4 mg total) by mouth every 8 (eight) hours as needed for nausea or vomiting. Qty: 12 tablet, Refills: 0    PARoxetine (PAXIL) 30 MG tablet Take 15 mg by mouth at bedtime.    polyethylene glycol (MIRALAX / GLYCOLAX) packet Take 17 g by mouth every other day.    risperidone (RISPERDAL) 4 MG tablet Take 1 tablet (4 mg total) by mouth at bedtime. Qty: 30 tablet, Refills: 2         DISCHARGE INSTRUCTIONS:    If you experience worsening of your admission symptoms, develop shortness of breath, life threatening emergency, suicidal or homicidal thoughts you must seek medical attention immediately by calling 911 or calling your MD immediately  if symptoms less severe.  You Must read complete instructions/literature along with all the possible adverse reactions/side effects for all the Medicines you take and that have been prescribed to you. Take any new Medicines after you have completely understood and accept all the possible adverse reactions/side effects.   Please note  You were cared for by a hospitalist during your hospital  stay. If you have any questions about your discharge medications or the care you received while you were in the hospital after you are discharged, you can call the unit and asked to speak with the hospitalist on call if the hospitalist that took care of you is not available. Once you are discharged, your primary care physician will handle any further medical issues. Please note that NO REFILLS for any discharge medications will be authorized once you are discharged, as it is imperative that  you return to your primary care physician (or establish a relationship with a primary care physician if you do not have one) for your aftercare needs so that they can reassess your need for medications and monitor your lab values.    Today   SUBJECTIVE    No complaint.  VITAL SIGNS:  Blood pressure 116/61, pulse 70, temperature 98.5 F (36.9 C), temperature source Oral, resp. rate 18, height 5\' 11"  (1.803 m), weight 220 lb (99.791 kg), SpO2 96 %.  I/O:   Intake/Output Summary (Last 24 hours) at 05/20/16 1412 Last data filed at 05/20/16 0556  Gross per 24 hour  Intake 2751.66 ml  Output   1250 ml  Net 1501.66 ml    PHYSICAL EXAMINATION:  GENERAL:  65 y.o.-year-old patient lying in the bed with no acute distress.  EYES: Pupils equal, round, reactive to light and accommodation. No scleral icterus. Extraocular muscles intact.  HEENT: Head atraumatic, normocephalic. Oropharynx and nasopharynx clear.  NECK:  Supple, no jugular venous distention. No thyroid enlargement, no tenderness.  LUNGS: Normal breath sounds bilaterally, no wheezing, rales,rhonchi or crepitation. No use of accessory muscles of respiration.  CARDIOVASCULAR: S1, S2 normal. No murmurs, rubs, or gallops.  ABDOMEN: Soft, non-tender, non-distended. Bowel sounds present. No organomegaly or mass.  EXTREMITIES: No pedal edema, cyanosis, or clubbing.  NEUROLOGIC: Cranial nerves II through XII are intact. Muscle strength 5/5 in all extremities. Sensation intact. Gait not checked.  PSYCHIATRIC: The patient is alert and oriented x 3.  SKIN: No obvious rash, lesion, or ulcer.   DATA REVIEW:   CBC  Recent Labs Lab 05/19/16 0650  WBC 8.6  HGB 10.1*  HCT 31.5*  PLT 302    Chemistries   Recent Labs Lab 05/18/16 0855  05/20/16 0430  NA 139  < > 141  K 4.0  < > 4.0  CL 104  < > 109  CO2 26  < > 25  GLUCOSE 115*  < > 85  BUN 21*  < > 11  CREATININE 1.73*  < > 1.17  CALCIUM 9.0  < > 8.2*  MG 2.2  --   --    AST 31  --   --   ALT 27  --   --   ALKPHOS 85  --   --   BILITOT 0.2*  --   --   < > = values in this interval not displayed.  Cardiac Enzymes  Recent Labs Lab 05/18/16 0855  TROPONINI <0.03    Microbiology Results  Results for orders placed or performed during the hospital encounter of 03/31/16  Blood culture (routine x 2)     Status: None   Collection Time: 03/31/16  1:01 PM  Result Value Ref Range Status   Specimen Description BLOOD LEFT ASSIST CONTROL  Final   Special Requests   Final    BOTTLES DRAWN AEROBIC AND ANAEROBIC  AERO 8CC ANA 5CC   Culture NO GROWTH 10 DAYS  Final   Report  Status 04/10/2016 FINAL  Final  Blood culture (routine x 2)     Status: None   Collection Time: 03/31/16  1:06 PM  Result Value Ref Range Status   Specimen Description BLOOD LEFT HAND  Final   Special Requests BOTTLES DRAWN AEROBIC AND ANAEROBIC  8CC  Final   Culture NO GROWTH 10 DAYS  Final   Report Status 04/10/2016 FINAL  Final    RADIOLOGY:  Ct Abdomen Pelvis W Contrast  05/18/2016  CLINICAL DATA:  Patient had recent surgery for repair of small bowel obstruction. Patient has abdominal distention vomiting currently. EXAM: CT ABDOMEN AND PELVIS WITH CONTRAST TECHNIQUE: Multidetector CT imaging of the abdomen and pelvis was performed using the standard protocol following bolus administration of intravenous contrast. CONTRAST:  75mL ISOVUE-300 IOPAMIDOL (ISOVUE-300) INJECTION 61% COMPARISON:  March 18, 2016 FINDINGS: Lower chest:  No acute findings. Hepatobiliary: There is an inflammation around the gallbladder with gallbladder wall thickening and a 5 mm stone in the gallbladder neck. There is edema surrounding the gallbladder. The liver is normal. Pancreas: No mass, inflammatory changes, or other significant abnormality. Spleen: Within normal limits in size and appearance. Adrenals/Urinary Tract: The adrenal glands and kidneys are normal. The bladder is normal. No masses identified. No evidence  of hydronephrosis. Stomach/Bowel: There is mild thickening of bowel wall of the first and second portion duodenum likely related to the adjacent inflammation of the gallbladder. No evidence of obstruction, or abnormal fluid collections. The appendix is normal. Vascular/Lymphatic: No pathologically enlarged lymph nodes. No evidence of abdominal aortic aneurysm. There is atherosclerosis of the abdominal aorta. Reproductive: No mass or other significant abnormality. Other: Small free fluid is identified in the pelvis. There are small bilateral inguinal herniation of mesenteric fat. Musculoskeletal: Degenerative joint changes of the spine are identified. IMPRESSION: Findings consistent with acute cholecystitis. Mild thickened bowel wall of the first and second portion of duodenum likely related to adjacent inflammation of the gallbladder. Small free fluid in the pelvis. Electronically Signed   By: Abelardo Diesel M.D.   On: 05/18/2016 17:01   US Abdomen Limited Ruq  05/19/2016  CLINICAL DATA:  Abdominal pain, nausea and vomiting starting yesterday EXAM: US ABDOMEN LIMITED - RIGHT UPPER QUADRANT COMPARISON:  None. FINDINGS: Gallbladder: There is thickening of gallbladder wall and gallbladder wall edema. Measures 1 cm in thickness. Findings highly suspicious for acute cholecystitis. There is non mobile gallstone in gallbladder neck region measures 5 mm. Common bile duct: Diameter: 3.1 mm in diameter within normal limits. Liver: No focal lesion identified. Within normal limits in parenchymal echogenicity. IMPRESSION: 1. There is significant thickening of gallbladder wall and edema of gallbladder wall highly suspicious for acute cholecystitis. Nonmobile gallstone in gallbladder neck region measures 5 mm. Normal CBD measures 3 mm in diameter. Unremarkable liver. Electronically Signed   By: Lahoma Crocker M.D.   On: 05/19/2016 10:46        Management plans discussed with the patient, family and they are in  agreement.  CODE STATUS:     Code Status Orders        Start     Ordered   05/18/16 2133  Full code   Continuous     05/18/16 2132    Code Status History    Date Active Date Inactive Code Status Order ID Comments User Context   03/18/2016  1:51 PM 03/26/2016  3:17 PM Full Code [id number]  Diego Sarita Haver, MD ED    Advance Directive Documentation  Most Recent Value   Type of Advance Directive  Healthcare Power of Attorney, Living will   Pre-existing out of facility DNR order (yellow form or pink MOST form)     "MOST" Form in Place?        TOTAL TIME TAKING CARE OF THIS PATIENT: 33 minutes.    Demetrios Loll M.D on 05/20/2016 at 2:12 PM  Between 7am to 6pm - Pager - [phone]  After 6pm go to [email or web address] - password [email or web address] [place] [facility]  Office  [phone]  CC: Primary care physician; Lelon Huh, MD

## 2016-05-20 NOTE — Care Management (Signed)
Physical therapy evaluation completed. Recommends home with home health and physical therapy. Telephone call to sister, Osborne Casco. Discussed home health agencies. States she does everything for her brother so will decline home health at this time. Discharge to home today per Dr. Bridgett Larsson. Sister will transport. Shelbie Ammons RN MSN CCM Care Management [phone]

## 2016-05-20 NOTE — Progress Notes (Signed)
Pt has been discharged home. Discharge instructions given and explained to pt and pt's sister. Pt's sister verbalized understanding.  Meds and f/u appointments reviewed with pt's sister. RX given. Pt escorted in a wheelchair.

## 2016-05-20 NOTE — Progress Notes (Signed)
CC: Acute cholecystitis, resolving Subjective: This patient on IV antibiotics for acute cholecystitis with recent laparotomy for small bowel obstruction. He also has orthostatic hypotension possibly related to medications. He is being worked up for that as he has multiple falls. He describes no abdominal pain today and this is confirmed by his significant other. She is very concerned about his fall risk but states that he is feeling better today. Denies jaundice or acholic stools but I cannot obtain a good review of systems from this patient  Objective: Vital signs in last 24 hours: Temp:  [98.5 F (36.9 C)-98.8 F (37.1 C)] 98.5 F (36.9 C) (06/08 0414) Pulse Rate:  [61-70] 70 (06/08 0414) Resp:  [18-20] 18 (06/08 0414) BP: (113-126)/(61-79) 116/61 mmHg (06/08 0414) SpO2:  [94 %-98 %] 96 % (06/08 0807) Last BM Date: 05/16/16  Intake/Output from previous day: 06/07 0701 - 06/08 0700 In: 3231.7 [P.O.:720; I.V.:2461.7; IV Piggyback:50] Out: 1250 [Urine:1250] Intake/Output this shift:    Physical exam:  Vital signs are reviewed tilt test from yesterday was reviewed as well. Appears comfortable tolerating clear liquid diet abdomen is soft nontender negative Murphy sign midline scar is noted. Calves are nontender no icterus no jaundice  Lab Results: CBC   Recent Labs  05/18/16 0855 05/19/16 0650  WBC 10.4 8.6  HGB 11.0* 10.1*  HCT 33.8* 31.5*  PLT 342 302   BMET  Recent Labs  05/19/16 0650 05/20/16 0430  NA 138 141  K 3.7 4.0  CL 106 109  CO2 27 25  GLUCOSE 89 85  BUN 14 11  CREATININE 1.30* 1.17  CALCIUM 8.1* 8.2*   PT/INR No results for input(s): LABPROT, INR in the last 72 hours. ABG No results for input(s): PHART, HCO3 in the last 72 hours.  Invalid input(s): PCO2, PO2  Studies/Results: Ct Abdomen Pelvis W Contrast  05/18/2016  CLINICAL DATA:  Patient had recent surgery for repair of small bowel obstruction. Patient has abdominal distention vomiting  currently. EXAM: CT ABDOMEN AND PELVIS WITH CONTRAST TECHNIQUE: Multidetector CT imaging of the abdomen and pelvis was performed using the standard protocol following bolus administration of intravenous contrast. CONTRAST:  39mL ISOVUE-300 IOPAMIDOL (ISOVUE-300) INJECTION 61% COMPARISON:  March 18, 2016 FINDINGS: Lower chest:  No acute findings. Hepatobiliary: There is an inflammation around the gallbladder with gallbladder wall thickening and a 5 mm stone in the gallbladder neck. There is edema surrounding the gallbladder. The liver is normal. Pancreas: No mass, inflammatory changes, or other significant abnormality. Spleen: Within normal limits in size and appearance. Adrenals/Urinary Tract: The adrenal glands and kidneys are normal. The bladder is normal. No masses identified. No evidence of hydronephrosis. Stomach/Bowel: There is mild thickening of bowel wall of the first and second portion duodenum likely related to the adjacent inflammation of the gallbladder. No evidence of obstruction, or abnormal fluid collections. The appendix is normal. Vascular/Lymphatic: No pathologically enlarged lymph nodes. No evidence of abdominal aortic aneurysm. There is atherosclerosis of the abdominal aorta. Reproductive: No mass or other significant abnormality. Other: Small free fluid is identified in the pelvis. There are small bilateral inguinal herniation of mesenteric fat. Musculoskeletal: Degenerative joint changes of the spine are identified. IMPRESSION: Findings consistent with acute cholecystitis. Mild thickened bowel wall of the first and second portion of duodenum likely related to adjacent inflammation of the gallbladder. Small free fluid in the pelvis. Electronically Signed   By: Abelardo Diesel M.D.   On: 05/18/2016 17:01   US Abdomen Limited Ruq  05/19/2016  CLINICAL DATA:  Abdominal pain, nausea and vomiting starting yesterday EXAM: US ABDOMEN LIMITED - RIGHT UPPER QUADRANT COMPARISON:  None. FINDINGS:  Gallbladder: There is thickening of gallbladder wall and gallbladder wall edema. Measures 1 cm in thickness. Findings highly suspicious for acute cholecystitis. There is non mobile gallstone in gallbladder neck region measures 5 mm. Common bile duct: Diameter: 3.1 mm in diameter within normal limits. Liver: No focal lesion identified. Within normal limits in parenchymal echogenicity. IMPRESSION: 1. There is significant thickening of gallbladder wall and edema of gallbladder wall highly suspicious for acute cholecystitis. Nonmobile gallstone in gallbladder neck region measures 5 mm. Normal CBD measures 3 mm in diameter. Unremarkable liver. Electronically Signed   By: Lahoma Crocker M.D.   On: 05/19/2016 10:46    Anti-infectives: Anti-infectives    Start     Dose/Rate Route Frequency Ordered Stop   05/18/16 2145  cefTRIAXone (ROCEPHIN) 1 g in dextrose 5 % 50 mL IVPB     1 g 100 mL/hr over 30 Minutes Intravenous Every 24 hours 05/18/16 2132        Assessment/Plan:  Labs are reviewed at this point I do not recommend surgery in this patient although it could be performed either via an open procedure or visible port  Or Hassan technique to gain entry laparoscopically. But at this point the patient is minimally symptomatic if at all. I will advance diet to a low-fat diet as his workup for his syncope or unsteadiness and false is being worked up. I discussed this patient's condition and care with Dr. Perrin Maltese last night and with the patient's significant other this morning. His gallbladder may need to be removed at some point and that can be done as an outpatient. Sign off at this point and have him follow-up with Dr. Perrin Maltese in 2 weeks.  Florene Glen, MD, FACS  05/20/2016

## 2016-05-26 ENCOUNTER — Ambulatory Visit (INDEPENDENT_AMBULATORY_CARE_PROVIDER_SITE_OTHER): Payer: Medicare PPO | Admitting: Family Medicine

## 2016-05-26 ENCOUNTER — Encounter: Payer: Self-pay | Admitting: Family Medicine

## 2016-05-26 VITALS — BP 106/64 | HR 84 | Temp 98.3°F | Resp 18 | Wt 222.0 lb

## 2016-05-26 DIAGNOSIS — E8809 Other disorders of plasma-protein metabolism, not elsewhere classified: Secondary | ICD-10-CM

## 2016-05-26 DIAGNOSIS — K819 Cholecystitis, unspecified: Secondary | ICD-10-CM

## 2016-05-26 DIAGNOSIS — D649 Anemia, unspecified: Secondary | ICD-10-CM | POA: Diagnosis not present

## 2016-05-26 DIAGNOSIS — I950 Idiopathic hypotension: Secondary | ICD-10-CM | POA: Diagnosis not present

## 2016-05-26 MED ORDER — MIDODRINE HCL 10 MG PO TABS: 10.0000 mg | ORAL_TABLET | Freq: Three times a day (TID) | ORAL | Status: DC

## 2016-05-26 NOTE — Progress Notes (Signed)
Patient: [name] Male    DOB: [birth year]   65 y.o.   MRN: [id number] Visit Date: 05/26/2016  Today's Provider: Lelon Huh, MD   Chief Complaint  Patient presents with  . Hospitalization Follow-up   Subjective:    HPI  Follow up Hospitalization  Patient was admitted to [facility] on 05/18/2016 and discharged on 05/20/2016. He was treated for Acute Cholecystis, Acute Renal Failure on CKD stage 2 and orthostatic Hypotension. Treatment for this included Rocephin. Patient was later changed to PO  Keflex. He was evaluated by Dr. Burt Knack and elected no to do cholecystectomy during hospitalization due to minimal symptoms.  Patient was tp follow up with Dr. Dahlia Byes in  June 03, 2016 at 9:30am to discuss outpatient cholecystectomy  He was also given IV fluids and prescribed Midodrine while in the hospital which improved Acute renal failure and Orthostatic Hypotension.  He reports good compliance with treatment. Has had no falls since discharge. No abdominal pain. He reports this condition is Improved. Patient still has occasional dizziness when getting out of bed in the mornings. Patient has some pain in his left side where he fell prior to hospitalization.  ------------------------------------------------------------------------------------     No Known Allergies Current Meds  Medication Sig  . alum & mag hydroxide-simeth (MAALOX/MYLANTA) 200-200-20 MG/5ML suspension Take 30 mLs by mouth every 6 (six) hours as needed for indigestion or heartburn.  . cephALEXin (KEFLEX) 500 MG capsule Take 1 capsule (500 mg total) by mouth 3 (three) times daily.  . clonazePAM (KLONOPIN) 1 MG tablet Take 1 tablet (1 mg total) by mouth at bedtime.  . midodrine (PROAMATINE) 10 MG tablet Take 1 tablet (10 mg total) by mouth 3 (three) times daily with meals.  Marland Kitchen OLANZapine (ZYPREXA) 15 MG tablet Take 15 mg by mouth at bedtime.  . ondansetron (ZOFRAN) 4 MG tablet Take 1 tablet (4 mg total) by mouth every  8 (eight) hours as needed for nausea or vomiting.  Marland Kitchen PARoxetine (PAXIL) 30 MG tablet Take 15 mg by mouth at bedtime.  . polyethylene glycol (MIRALAX / GLYCOLAX) packet Take 17 g by mouth every other day.  . risperidone (RISPERDAL) 4 MG tablet Take 1 tablet (4 mg total) by mouth at bedtime.    Review of Systems  Constitutional: Negative for fever, chills and appetite change.  Respiratory: Positive for shortness of breath (occurs when taking a shower). Negative for chest tightness and wheezing.   Cardiovascular: Negative for chest pain and palpitations.  Gastrointestinal: Negative for nausea, vomiting and abdominal pain.  Neurological: Positive for dizziness.    Social History  Substance Use Topics  . Smoking status: Former Smoker    Types: Cigarettes    Quit date: 08/04/2004  . Smokeless tobacco: Former Systems developer    Quit date: 08/04/2004  . Alcohol Use: No   Objective:   BP 106/64 mmHg  Pulse 84  Temp(Src) 98.3 F (36.8 C) (Oral)  Resp 18  Wt 222 lb (100.699 kg)  SpO2 97%  Physical Exam  General Appearance:    Alert, cooperative, no distress  Eyes:    PERRL, conjunctiva/corneas clear, EOM's intact       Lungs:     Clear to auscultation bilaterally, respirations unlabored  Heart:    Regular rate and rhythm  Abdomen:   bowel sounds present and normal in all 4 quadrants. No CVA tenderness       Assessment & Plan:     1. Cholecystitis Currently asymptomatic, but  was likely contributing to hypotension. No nausea or vomiting and eating better since treated with abx and IVF last week. To follow up with surgery next week as scheduled.   2. Idiopathic hypotension Likely multifactorial. His baseline BP is low, and likely aggravated by recent illness, poor PO intake, and possibly psychiatric medications. To continue the lowered dose of olanzapine for the time being.   3. Hypoalbuminemia Likely due to poor PO intake since SBO last month. Is eating better since recent discharge.    4. Anemia, unspecified anemia type As above  Recheck CBC and met b at follow up in about a month. Consider weaning midodrine at that time.       The entirety of the information documented in the History of Present Illness, Review of Systems and Physical Exam were personally obtained by me. Portions of this information were initially documented by Meyer Cory, CMA and reviewed by me for thoroughness and accuracy.    Lelon Huh, MD  [facility]

## 2016-05-31 ENCOUNTER — Other Ambulatory Visit: Payer: Self-pay

## 2016-06-03 ENCOUNTER — Encounter: Payer: Self-pay | Admitting: Surgery

## 2016-06-03 ENCOUNTER — Ambulatory Visit (INDEPENDENT_AMBULATORY_CARE_PROVIDER_SITE_OTHER): Payer: Medicare PPO | Admitting: Surgery

## 2016-06-03 ENCOUNTER — Telehealth: Payer: Self-pay | Admitting: Family Medicine

## 2016-06-03 VITALS — BP 113/65 | HR 73 | Temp 97.5°F | Ht 72.0 in | Wt 223.0 lb

## 2016-06-03 DIAGNOSIS — K8020 Calculus of gallbladder without cholecystitis without obstruction: Secondary | ICD-10-CM | POA: Diagnosis not present

## 2016-06-03 MED ORDER — FLUDROCORTISONE ACETATE 0.1 MG PO TABS: 0.1000 mg | ORAL_TABLET | Freq: Every day | ORAL | Status: DC

## 2016-06-03 NOTE — Telephone Encounter (Signed)
Patient sister came up this morning and wanted to know if can change his blood pressure medicine, the one is not working, he is getting dizzy and falling, please advise on what to do  Cb# [phone]   bp readings in morning 94/54 & pm 106/69

## 2016-06-03 NOTE — Patient Instructions (Signed)
Please give [place] a call if you have any questions or concerns. We will see back at your follow up appointment.

## 2016-06-03 NOTE — Telephone Encounter (Signed)
Patient's sister [name] was notified. Scheduled f/u appt for 06/18/2016.

## 2016-06-03 NOTE — Progress Notes (Signed)
Outpatient Surgical Follow Up  06/03/2016  [name] is an 65 y.o. male.   Chief Complaint  Patient presents with  . Follow-up    Cholecystitis    HPI: 65 year old male well known to me status post laparotomy and lysis of adhesions on April for bowel obstruction. Recently developed an episode of acute cholecystitis that was managed medically. He does have significant psychiatric history and main issues orthostatic hypotension and recurrent falls. He actually fell yesterday and is sore in the left chest wall. From gallbladder perspective hE denies any right upper quadrant pain and he is tolerating a regular diet  Past Medical History  Diagnosis Date  . Chicken pox   . Measles   . Mumps   . Dizziness   . Schizophrenia [facility])     Past Surgical History  Procedure Laterality Date  . None    . Laparotomy N/A 03/20/2016    Procedure: EXPLORATORY LAPAROTOMY with lysis of adhesions;  Surgeon: Jules Husbands, MD;  Location: [facility] ORS;  Service: General;  Laterality: N/A;  . Colon surgery    . Abdominal surgery      Family History  Problem Relation Age of Onset  . Diabetes Mother   . Heart disease Mother   . Stroke Mother   . Diabetes Father   . Hypertension Father   . Lung cancer Father   . Cancer Father     lung cancer  . Schizophrenia Sister   . Lung cancer Sister   . Colon cancer Sister     Social History:  reports that he quit smoking about 11 years ago. His smoking use included Cigarettes. He quit smokeless tobacco use about 11 years ago. He reports that he does not drink alcohol or use illicit drugs.  Allergies: No Known Allergies  Medications reviewed.    ROS Otherwise negative   BP 113/65 mmHg  Pulse 73  Temp(Src) 97.5 F (36.4 C) (Oral)  Ht 6' (1.829 m)  Wt 101.152 kg (223 lb)  BMI 30.24 kg/m2  Physical Exam NAD, awake alert Chest: mild TTP left chest wall, no deformity of fxs Abd: soft, nt, no peritonitis, midline lap scar Neuro: no focal  deficits, awake, alert   No results found for this or any previous visit (from the past 48 hour(s)). No results found.  Assessment/Plan:  1. Gallstones I am concerned about the orthostatic hypotension I think he needs to have these results before doing an elective cystectomy. He also has an appointment with the psychiatrist I will review his medications and in a month or so. Discussed with and his sister who is the power of attorney in detail and we'll see him back and in August after he sees psychiatry for reevaluation of laparoscopic cholecystectomy. Extensive counseling provided and they understand. No need for urgent cholecystectomy at this poing   06/03/2016,10:04 AM

## 2016-06-03 NOTE — Telephone Encounter (Signed)
Have sent prescription for fludrocort[facility] to [facility]. Take this once a day in addition to midodrine that he is taking three times a day. Schedule follow up o.v. In 2 weeks.

## 2016-06-08 DIAGNOSIS — H524 Presbyopia: Secondary | ICD-10-CM | POA: Diagnosis not present

## 2016-06-08 DIAGNOSIS — H5210 Myopia, unspecified eye: Secondary | ICD-10-CM | POA: Diagnosis not present

## 2016-06-18 ENCOUNTER — Encounter: Payer: Self-pay | Admitting: Family Medicine

## 2016-06-18 ENCOUNTER — Ambulatory Visit (INDEPENDENT_AMBULATORY_CARE_PROVIDER_SITE_OTHER): Payer: Commercial Managed Care - HMO | Admitting: Family Medicine

## 2016-06-18 VITALS — BP 104/64 | HR 74 | Temp 98.7°F | Resp 18 | Wt 236.0 lb

## 2016-06-18 DIAGNOSIS — R42 Dizziness and giddiness: Secondary | ICD-10-CM | POA: Diagnosis not present

## 2016-06-18 DIAGNOSIS — I950 Idiopathic hypotension: Secondary | ICD-10-CM

## 2016-06-18 MED ORDER — MIDODRINE HCL 10 MG PO TABS: 10.0000 mg | ORAL_TABLET | Freq: Three times a day (TID) | ORAL | Status: DC

## 2016-06-18 MED ORDER — FLUDROCORTISONE ACETATE 0.1 MG PO TABS: 0.1000 mg | ORAL_TABLET | Freq: Every day | ORAL | Status: DC

## 2016-06-18 NOTE — Progress Notes (Signed)
Patient: [name] Male    DOB: [birth year]   65 y.o.   MRN: [id number] Visit Date: 06/18/2016  Today's Provider: Lelon Huh, MD   Chief Complaint  Patient presents with  . Hypotension    2 week follow up   Subjective:    HPI Idiopathic Hypotension:  Patient was last seen 05/26/2016 for this problem. No changes were made during that visit. Changes made since the last office visit includes adding Fludrocortisone in addition to patient taking Midodrine. This change was made due to patients sister reporting that patient was dizzy and constantly falling. Today patient comes in with his sister [name] reporting good compliance with treatment, good tolerance and good symptom control. Patient states his blood pressure has improved along with his dizziness. Patient has not had any more falls since starting Fludrocortisone.  Home blood pressure reading have been running around [id number] systolic over 99991111 diastolic. He has also cut back to one Risperdal at night to hypotension as potential adverse effects.     No Known Allergies Current Meds  Medication Sig  . alum & mag hydroxide-simeth (MAALOX/MYLANTA) 200-200-20 MG/5ML suspension Take 30 mLs by mouth every 6 (six) hours as needed for indigestion or heartburn.  . fludrocortisone (FLORINEF) 0.1 MG tablet Take 1 tablet (0.1 mg total) by mouth daily.  . midodrine (PROAMATINE) 10 MG tablet Take 1 tablet (10 mg total) by mouth 3 (three) times daily with meals.  . mupirocin ointment (BACTROBAN) 2 %   . OLANZapine (ZYPREXA) 15 MG tablet Take 15 mg by mouth at bedtime.  Marland Kitchen PARoxetine (PAXIL) 30 MG tablet Take 15 mg by mouth at bedtime.  . polyethylene glycol powder (GLYCOLAX/MIRALAX) powder   . risperidone (RISPERDAL) 4 MG tablet Take 1 tablet (4 mg total) by mouth at bedtime.    Review of Systems  Constitutional: Negative for fever, chills, appetite change and fatigue.  Respiratory: Negative for chest tightness, shortness of breath and  wheezing.   Cardiovascular: Negative for chest pain and palpitations.  Gastrointestinal: Negative for nausea, vomiting and abdominal pain.  Neurological: Negative for dizziness, weakness, light-headedness and numbness.  Hematological: Bruises/bleeds easily.    Social History  Substance Use Topics  . Smoking status: Former Smoker    Types: Cigarettes    Quit date: 08/04/2004  . Smokeless tobacco: Former Systems developer    Quit date: 08/04/2004  . Alcohol Use: No   Objective:   BP 104/64 mmHg  Pulse 74  Temp(Src) 98.7 F (37.1 C) (Oral)  Resp 18  Wt 236 lb (107.049 kg)  SpO2 97%  Physical Exam   General Appearance:    Alert, cooperative, no distress  Eyes:    PERRL, conjunctiva/corneas clear, EOM's intact       Lungs:     Clear to auscultation bilaterally, respirations unlabored  Heart:    Regular rate and rhythm  Neurologic:   Awake, alert, oriented x 3. No apparent focal neurological           defect.          Assessment & Plan:     1. Idiopathic hypotension Greatly improved on current dose of fludrocortisone and midodrine. His baseline BP is low, and likely exacerbated by bowel surgery. Will go ahead and increase Risperdal back up to previous dose of 2x4mg  hs. Continue current dose  fludrocortisone and midodrine. Call if BP>140/90. Otherwise follow up 3 months and will work on weaning medications.   2. Dizziness Resolved since BP has  been up to normal readings.        Lelon Huh, MD  [facility]

## 2016-06-24 ENCOUNTER — Ambulatory Visit: Payer: Medicare PPO | Admitting: Cardiovascular Disease

## 2016-06-25 ENCOUNTER — Ambulatory Visit: Payer: Self-pay | Admitting: Family Medicine

## 2016-07-16 NOTE — Telephone Encounter (Signed)
error 

## 2016-08-02 ENCOUNTER — Ambulatory Visit: Payer: Self-pay | Admitting: Psychiatry

## 2016-08-03 ENCOUNTER — Encounter: Payer: Self-pay | Admitting: Psychiatry

## 2016-08-03 ENCOUNTER — Ambulatory Visit (INDEPENDENT_AMBULATORY_CARE_PROVIDER_SITE_OTHER): Payer: Commercial Managed Care - HMO | Admitting: Psychiatry

## 2016-08-03 VITALS — BP 115/71 | HR 71 | Temp 97.7°F | Ht 72.0 in | Wt 235.8 lb

## 2016-08-03 DIAGNOSIS — F200 Paranoid schizophrenia: Secondary | ICD-10-CM

## 2016-08-03 MED ORDER — PAROXETINE HCL 30 MG PO TABS: 15.0000 mg | ORAL_TABLET | Freq: Every day | ORAL | 2 refills | Status: DC

## 2016-08-03 MED ORDER — RISPERIDONE 4 MG PO TABS: 4.0000 mg | ORAL_TABLET | Freq: Every day | ORAL | 2 refills | Status: DC

## 2016-08-03 MED ORDER — OLANZAPINE 15 MG PO TABS: 30.0000 mg | ORAL_TABLET | Freq: Every day | ORAL | 2 refills | Status: DC

## 2016-08-03 NOTE — Progress Notes (Signed)
Patient ID: [name], male   DOB: 12-Oct-1951, 65 y.o.   MRN: [id number]  [facility] MD/PA/NP OP Progress Note  08/03/2016 8:52 AM PHILLIPS GRIM  MRN:  [id number]  Subjective:  Patient is a 65 year old white male with a history of schizophrenia.  Patient is presents today with his sister for a follow-up appointment.  Patient today reports that he's been doing quite well. Per his sister patient the had low blood pressure last time which was determined to be due to his surgery and medications were adjusted by the primary care physician. Patient was then restarted by his sister on the Zyprexa at 30 mg which had been decreased to 15 mg at past visit. Patient reports sleeping well and eating well. He denies any mood symptoms.  Denies hearing voices or seeing things. Denies any suicidal ideations.   Chief Complaint:  Chief Complaint    Follow-up; Medication Refill     Doing well Visit Diagnosis:     ICD-9-CM ICD-10-CM   1. Paranoid schizophrenia ([place]) 295.30 F20.0     Past Medical History:  Past Medical History:  Diagnosis Date  . Chicken pox   . Dizziness   . Measles   . Mumps   . Schizophrenia [facility])     Past Surgical History:  Procedure Laterality Date  . ABDOMINAL SURGERY    . COLON SURGERY    . LAPAROTOMY N/A 03/20/2016   Procedure: EXPLORATORY LAPAROTOMY with lysis of adhesions;  Surgeon: Jules Husbands, MD;  Location: [facility] ORS;  Service: General;  Laterality: N/A;  . None     Family History:  Family History  Problem Relation Age of Onset  . Diabetes Mother   . Heart disease Mother   . Stroke Mother   . Diabetes Father   . Hypertension Father   . Lung cancer Father   . Cancer Father     lung cancer  . Schizophrenia Sister   . Lung cancer Sister   . Colon cancer Sister    Social History:  Social History   Social History  . Marital status: Single    Spouse name: N/A  . Number of children: 2  . Years of education: N/A   Occupational History  . Disabled    Social  History Main Topics  . Smoking status: Former Smoker    Types: Cigarettes    Quit date: 08/04/2004  . Smokeless tobacco: Former Systems developer    Quit date: 08/04/2004  . Alcohol use No  . Drug use: No  . Sexual activity: Not Currently   Other Topics Concern  . None   Social History Narrative  . None   Additional History:   Assessment:   Musculoskeletal: Strength & Muscle Tone: within normal limits Gait & Station: normal Patient leans: N/A  Psychiatric Specialty Exam: HPI  Review of Systems  Psychiatric/Behavioral: Negative for depression, hallucinations, memory loss, substance abuse and suicidal ideas. The patient is not nervous/anxious and does not have insomnia.   All other systems reviewed and are negative.   Blood pressure 115/71, pulse 71, temperature 97.7 F (36.5 C), temperature source Oral, height 6' (1.829 m), weight 235 lb 12.8 oz (107 kg).Body mass index is 31.98 kg/m.  General Appearance: Neat and Well Groomed  Eye Contact:  Good  Speech:  Normal Rate  Volume:  Normal  Mood:  Good   Affect:  Appropriate and Congruent  Thought Process:  Linear and at times concrete  Orientation:  Full (Time, Place, and Person)  Thought Content:  Negative  Suicidal Thoughts:  No  Homicidal Thoughts:  No  Memory:  Immediate;   Good Recent;   Good Remote;   Good  Judgement:  Good  Insight:  Good  Psychomotor Activity:  Negative  Concentration:  Good  Recall:  Good  Fund of Knowledge: Fair  Language: Fair  Akathisia:  Negative  Handed:  Right unknown   AIMS (if indicated):  On 02/03/2016, normal, no rigidity or cog wheeling  Assets:  Desire for Improvement Social Support  ADL's:  Intact  Cognition: WNL  Sleep:  good   Is the patient at risk to self?  No. Has the patient been a risk to self in the past 6 months?  No. Has the patient been a risk to self within the distant past?  No. Is the patient a risk to others?  No. Has the patient been a risk to others in the past 6  months?  No. Has the patient been a risk to others within the distant past?  No.  Current Medications: Current Outpatient Prescriptions  Medication Sig Dispense Refill  . alum & mag hydroxide-simeth (MAALOX/MYLANTA) 200-200-20 MG/5ML suspension Take 30 mLs by mouth every 6 (six) hours as needed for indigestion or heartburn.    . fludrocortisone (FLORINEF) 0.1 MG tablet Take 1 tablet (0.1 mg total) by mouth daily. 30 tablet 3  . midodrine (PROAMATINE) 10 MG tablet Take 1 tablet (10 mg total) by mouth 3 (three) times daily with meals. 90 tablet 3  . OLANZapine (ZYPREXA) 15 MG tablet Take 15 mg by mouth at bedtime.    Marland Kitchen PARoxetine (PAXIL) 30 MG tablet Take 15 mg by mouth at bedtime.    . polyethylene glycol powder (GLYCOLAX/MIRALAX) powder     . risperidone (RISPERDAL) 4 MG tablet Take 1 tablet (4 mg total) by mouth at bedtime. 30 tablet 2   No current facility-administered medications for this visit.     Medical Decision Making:  Established Problem, Stable/Improving (1), Review of Medication Regimen & Side Effects (2) and Review of New Medication or Change in Dosage (2)  Treatment Plan Summary:Medication management and Plan   Schizophrenia-stable. We will continue his olanzapine 30 mg daily, his risperidone 4 mg, at bedtime, paroxetine 15 mg daily, Discussed decreasing clonazepam to 0.5 mg at bedtime for 2-3 weeks and then discontinuing. Patient and his sister were okay with this plan Patient and his sister were educated about the incidence of side effects and long-term movement disorders being high with combination of Zyprexa and Risperdal. Also discussed metabolic disturbances with the combination of these medications. However they stated that patient has needed this combination to be able to function.   Patient will follow up in 3 months. They've been encouraged to call with any questions or concerns prior to the next appointment.   Nocole Zammit 08/03/2016, 8:52 AM

## 2016-08-11 ENCOUNTER — Ambulatory Visit: Payer: Self-pay | Admitting: Surgery

## 2016-09-20 ENCOUNTER — Encounter: Payer: Self-pay | Admitting: Family Medicine

## 2016-09-20 ENCOUNTER — Ambulatory Visit (INDEPENDENT_AMBULATORY_CARE_PROVIDER_SITE_OTHER): Payer: Commercial Managed Care - HMO | Admitting: Family Medicine

## 2016-09-20 VITALS — BP 104/64 | HR 54 | Temp 97.9°F | Resp 16 | Wt 237.0 lb

## 2016-09-20 DIAGNOSIS — I950 Idiopathic hypotension: Secondary | ICD-10-CM | POA: Diagnosis not present

## 2016-09-20 DIAGNOSIS — R42 Dizziness and giddiness: Secondary | ICD-10-CM | POA: Diagnosis not present

## 2016-09-20 DIAGNOSIS — Z23 Encounter for immunization: Secondary | ICD-10-CM | POA: Diagnosis not present

## 2016-09-20 NOTE — Progress Notes (Signed)
       Patient: [name] Male    DOB: 11/04/1951   65 y.o.   MRN: [id number] Visit Date: 09/20/2016  Today's Provider: Lelon Huh, MD   Chief Complaint  Patient presents with  . Follow-up  . Hypotension  . Anemia   Subjective:    HPI  Idiopathic hypotension: From 06/18/2016- increased Risperdal back up to previous dose of 2x 4mg  hs. Continued same dose  fludrocortisone and midodrine. States he still gets a little dizzy if he stands quickly.    Otherwise doing well   No Known Allergies   Current Outpatient Prescriptions:  .  fludrocortisone (FLORINEF) 0.1 MG tablet, Take 1 tablet (0.1 mg total) by mouth daily., Disp: 30 tablet, Rfl: 3 .  midodrine (PROAMATINE) 10 MG tablet, Take 1 tablet (10 mg total) by mouth 3 (three) times daily with meals., Disp: 90 tablet, Rfl: 3 .  OLANZapine (ZYPREXA) 15 MG tablet, Take 2 tablets (30 mg total) by mouth at bedtime. Take 2 tablets by mouth at bedtime, Disp: 60 tablet, Rfl: 2 .  PARoxetine (PAXIL) 30 MG tablet, Take 0.5 tablets (15 mg total) by mouth at bedtime., Disp: 15 tablet, Rfl: 2 .  polyethylene glycol powder (GLYCOLAX/MIRALAX) powder, , Disp: , Rfl:  .  risperidone (RISPERDAL) 4 MG tablet, Take 1 tablet (4 mg total) by mouth at bedtime., Disp: 30 tablet, Rfl: 2 .  alum & mag hydroxide-simeth (MAALOX/MYLANTA) 200-200-20 MG/5ML suspension, Take 30 mLs by mouth every 6 (six) hours as needed for indigestion or heartburn., Disp: , Rfl:   Review of Systems  Constitutional: Negative for appetite change, chills and fever.  Respiratory: Negative for chest tightness, shortness of breath and wheezing.   Cardiovascular: Negative for chest pain and palpitations.  Gastrointestinal: Positive for constipation. Negative for abdominal pain, nausea and vomiting.  Neurological: Positive for dizziness.    Social History  Substance Use Topics  . Smoking status: Former Smoker    Types: Cigarettes    Quit date: 08/04/2004  . Smokeless  tobacco: Former Systems developer    Quit date: 08/04/2004  . Alcohol use No   Objective:   BP 104/64 (BP Location: Right Arm, Patient Position: Sitting, Cuff Size: Large)   Pulse (!) 54   Temp 97.9 F (36.6 C) (Oral)   Resp 16   Wt 237 lb (107.5 kg)   BMI 32.14 kg/m   Physical Exam   General Appearance:    Alert, cooperative, no distress  Eyes:    PERRL, conjunctiva/corneas clear, EOM's intact       Lungs:     Clear to auscultation bilaterally, respirations unlabored  Heart:    Regular rate and rhythm  Neurologic:   Awake, alert, oriented x 3. No apparent focal neurological           defect.           Assessment & Plan:     1. Idiopathic hypotension Fairly stable on current medications. Counseled on importance of standing slowly and use support for about a minute after standing.   2. Dizziness   3. Need for pneumococcal vaccination  - Pneumococcal polysaccharide vaccine 23-valent greater than or equal to 2yo subcutaneous/IM  4. Need for influenza vaccination  - Flu vaccine HIGH DOSE PF         Lelon Huh, MD  [facility]

## 2016-10-26 ENCOUNTER — Encounter: Payer: Self-pay | Admitting: Psychiatry

## 2016-10-26 ENCOUNTER — Ambulatory Visit (INDEPENDENT_AMBULATORY_CARE_PROVIDER_SITE_OTHER): Payer: Commercial Managed Care - HMO | Admitting: Psychiatry

## 2016-10-26 VITALS — BP 129/76 | HR 67 | Temp 97.5°F | Resp 15 | Wt 240.4 lb

## 2016-10-26 DIAGNOSIS — F200 Paranoid schizophrenia: Secondary | ICD-10-CM | POA: Diagnosis not present

## 2016-10-26 MED ORDER — OLANZAPINE 15 MG PO TABS: 30.0000 mg | ORAL_TABLET | Freq: Every day | ORAL | 2 refills | Status: DC

## 2016-10-26 MED ORDER — PAROXETINE HCL 30 MG PO TABS: 15.0000 mg | ORAL_TABLET | Freq: Every day | ORAL | 2 refills | Status: DC

## 2016-10-26 MED ORDER — RISPERIDONE 4 MG PO TABS: 4.0000 mg | ORAL_TABLET | Freq: Every day | ORAL | 2 refills | Status: DC

## 2016-10-26 NOTE — Progress Notes (Signed)
Patient ID: [name], male   DOB: [birth year], 65 y.o.   MRN: [id number]  [facility] MD/PA/NP OP Progress Note  10/26/2016 1:08 PM [name]  MRN:  [id number]  Subjective:  Patient is a 65 year old white male with a history of schizophrenia.  Patient is presents today with his sister for a follow-up appointment.  Patient today reports that he's been doing quite well. Has been taking his medications regularly. Denies any side effects. Discussed his tongue movement and his sister states he has had that for a long time. His blood pressure has also stabilized. Denies hearing voices or seeing things. Denies any suicidal ideations.   Chief Complaint:  Chief Complaint    Other     Doing well Visit Diagnosis:     ICD-9-CM ICD-10-CM   1. Paranoid schizophrenia ([place]) 295.30 F20.0     Past Medical History:  Past Medical History:  Diagnosis Date  . Chicken pox   . Dizziness   . Measles   . Mumps   . Schizophrenia [facility])     Past Surgical History:  Procedure Laterality Date  . ABDOMINAL SURGERY    . COLON SURGERY    . LAPAROTOMY N/A 03/20/2016   Procedure: EXPLORATORY LAPAROTOMY with lysis of adhesions;  Surgeon: Jules Husbands, MD;  Location: [facility] ORS;  Service: General;  Laterality: N/A;  . None     Family History:  Family History  Problem Relation Age of Onset  . Diabetes Mother   . Heart disease Mother   . Stroke Mother   . Diabetes Father   . Hypertension Father   . Lung cancer Father   . Cancer Father     lung cancer  . Schizophrenia Sister   . Lung cancer Sister   . Colon cancer Sister    Social History:  Social History   Social History  . Marital status: Single    Spouse name: N/A  . Number of children: 2  . Years of education: N/A   Occupational History  . Disabled    Social History Main Topics  . Smoking status: Former Smoker    Types: Cigarettes    Quit date: 08/04/2004  . Smokeless tobacco: Former Systems developer    Quit date: 08/04/2004  . Alcohol use No  .  Drug use: No  . Sexual activity: Not Currently   Other Topics Concern  . None   Social History Narrative  . None   Additional History:   Assessment:   Musculoskeletal: Strength & Muscle Tone: within normal limits Gait & Station: normal Patient leans: N/A  Psychiatric Specialty Exam: Medication Refill     Review of Systems  Psychiatric/Behavioral: Negative for depression, hallucinations, memory loss, substance abuse and suicidal ideas. The patient is not nervous/anxious and does not have insomnia.   All other systems reviewed and are negative.   Blood pressure 129/76, pulse 67, temperature 97.5 F (36.4 C), temperature source Tympanic, resp. rate 15, weight 240 lb 6.4 oz (109 kg), SpO2 97 %.Body mass index is 32.6 kg/m.  General Appearance: Neat and Well Groomed  Eye Contact:  Good  Speech:  Normal Rate  Volume:  Normal  Mood:  Good   Affect:  Appropriate and Congruent  Thought Process:  Linear and at times concrete  Orientation:  Full (Time, Place, and Person)  Thought Content:  Negative  Suicidal Thoughts:  No  Homicidal Thoughts:  No  Memory:  Immediate;   Good Recent;   Good Remote;   Good  Judgement:  Good  Insight:  Good  Psychomotor Activity:  Negative  Concentration:  Good  Recall:  Good  Fund of Knowledge: Fair  Language: Fair  Akathisia:  Negative  Handed:  Right unknown   AIMS (if indicated):  On 10/26/2016, normal, no rigidity or cog wheeling  Assets:  Desire for Improvement Social Support  ADL's:  Intact  Cognition: WNL  Sleep:  good   Is the patient at risk to self?  No. Has the patient been a risk to self in the past 6 months?  No. Has the patient been a risk to self within the distant past?  No. Is the patient a risk to others?  No. Has the patient been a risk to others in the past 6 months?  No. Has the patient been a risk to others within the distant past?  No.  Current Medications: Current Outpatient Prescriptions  Medication Sig  Dispense Refill  . fludrocortisone (FLORINEF) 0.1 MG tablet Take 1 tablet (0.1 mg total) by mouth daily. 30 tablet 3  . midodrine (PROAMATINE) 10 MG tablet Take 1 tablet (10 mg total) by mouth 3 (three) times daily with meals. 90 tablet 3  . OLANZapine (ZYPREXA) 15 MG tablet Take 2 tablets (30 mg total) by mouth at bedtime. Take 2 tablets by mouth at bedtime 60 tablet 2  . PARoxetine (PAXIL) 30 MG tablet Take 0.5 tablets (15 mg total) by mouth at bedtime. 15 tablet 2  . polyethylene glycol powder (GLYCOLAX/MIRALAX) powder     . risperidone (RISPERDAL) 4 MG tablet Take 1 tablet (4 mg total) by mouth at bedtime. 30 tablet 2  . alum & mag hydroxide-simeth (MAALOX/MYLANTA) 200-200-20 MG/5ML suspension Take 30 mLs by mouth every 6 (six) hours as needed for indigestion or heartburn.     No current facility-administered medications for this visit.     Medical Decision Making:  Established Problem, Stable/Improving (1), Review of Medication Regimen & Side Effects (2) and Review of New Medication or Change in Dosage (2)  Treatment Plan Summary:Medication management and Plan   Schizophrenia-stable. We will continue his olanzapine 30 mg daily, his risperidone 4 mg, at bedtime, paroxetine 15 mg daily,   Patient and his sister were educated about the incidence of side effects and long-term movement disorders being high with combination of Zyprexa and Risperdal. Also discussed metabolic disturbances with the combination of these medications. However they stated that patient has needed this combination to be able to function.   Patient will follow up in 3 months. They've been encouraged to call with any questions or concerns prior to the next appointment.   [name] 10/26/2016, 1:08 PM

## 2016-11-02 ENCOUNTER — Ambulatory Visit: Payer: Medicare PPO | Admitting: Psychiatry

## 2016-11-08 ENCOUNTER — Other Ambulatory Visit: Payer: Self-pay | Admitting: Family Medicine

## 2016-11-15 ENCOUNTER — Other Ambulatory Visit: Payer: Self-pay | Admitting: Family Medicine

## 2016-11-20 ENCOUNTER — Other Ambulatory Visit: Payer: Self-pay | Admitting: Family Medicine

## 2016-12-27 ENCOUNTER — Other Ambulatory Visit: Payer: Self-pay | Admitting: Family Medicine

## 2016-12-31 IMAGING — CT CT ABD-PELV W/ CM
2 of 5 series · 16 of 46 positions shown, 18 images · IV contrast (iopamidol)
Comparison: March 18, 2016

CLINICAL DATA: Patient had recent surgery for repair of small bowel
obstruction. Patient has abdominal distention vomiting currently.

EXAM:
CT ABDOMEN AND PELVIS WITH CONTRAST
TECHNIQUE: Multidetector CT imaging of the abdomen and pelvis was performed
using the standard protocol following bolus administration of
intravenous contrast.
CONTRAST:  75mL QXJIFE-YNN IOPAMIDOL (QXJIFE-YNN) INJECTION 61%

[Series 2: routine abd pel with · axial · 0.75mm/px · z∈[-1001,-566]mm · 13 of 99 slices shown, 15 images]
[im 6/99  soft-tissue]
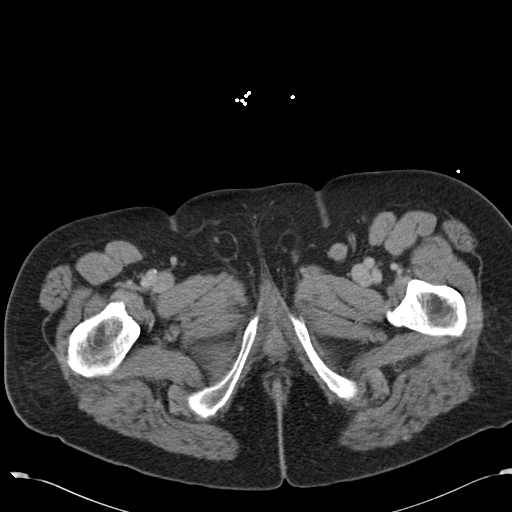
[im 6/99  bone]
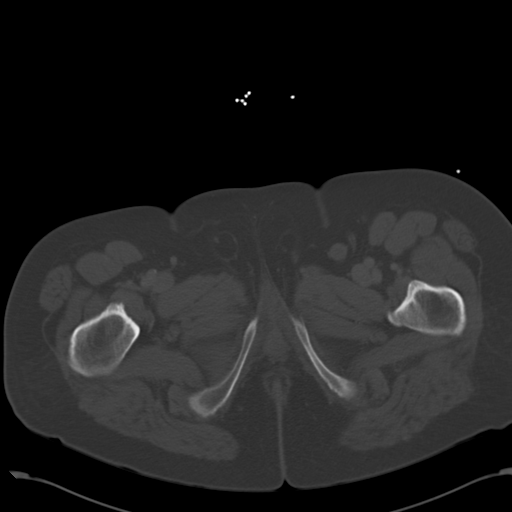
[im 16/99  soft-tissue]
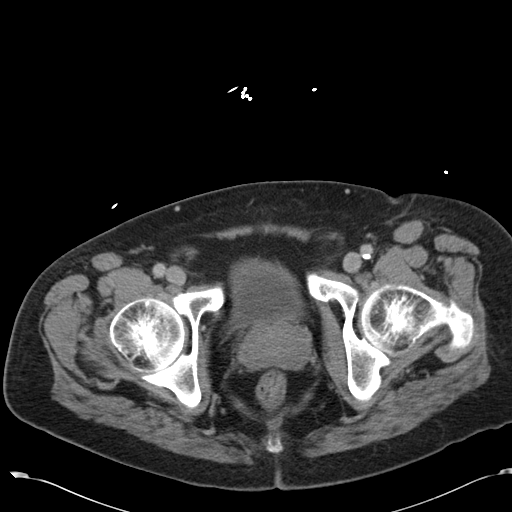
[im 21/99  soft-tissue]
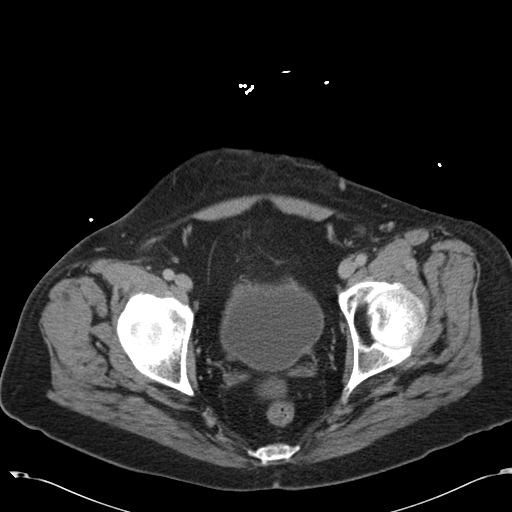
[im 26/99  soft-tissue]
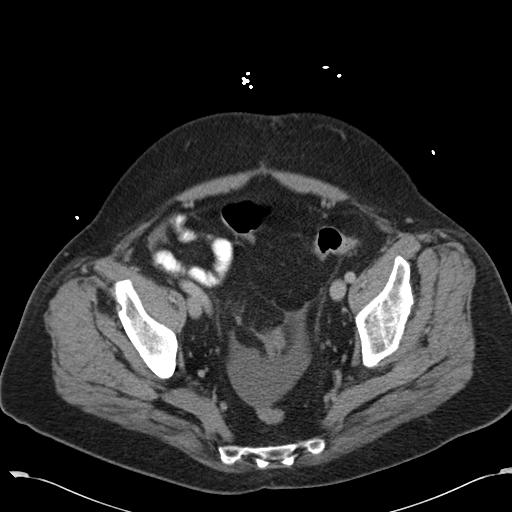
[im 37/99  soft-tissue]
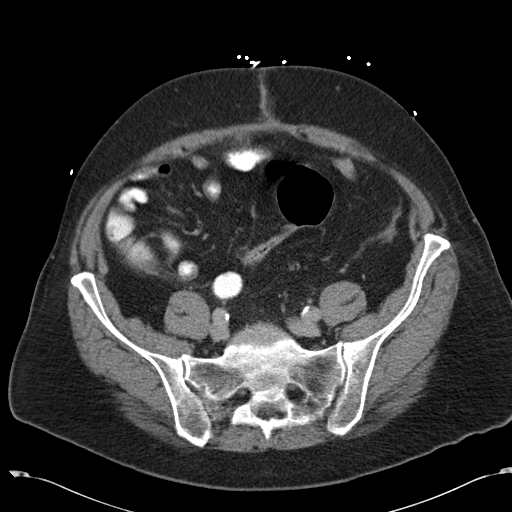
[im 42/99  soft-tissue]
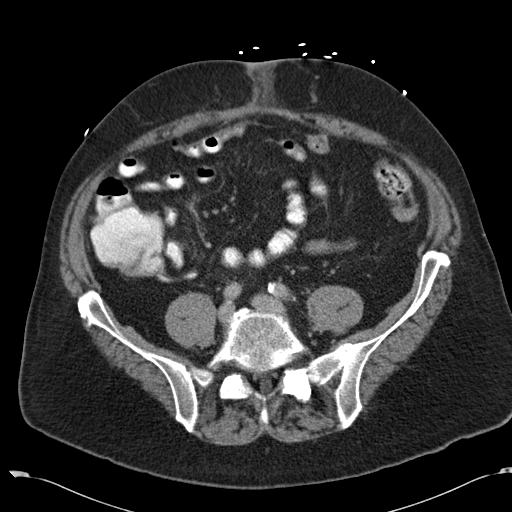
[im 52/99  soft-tissue]
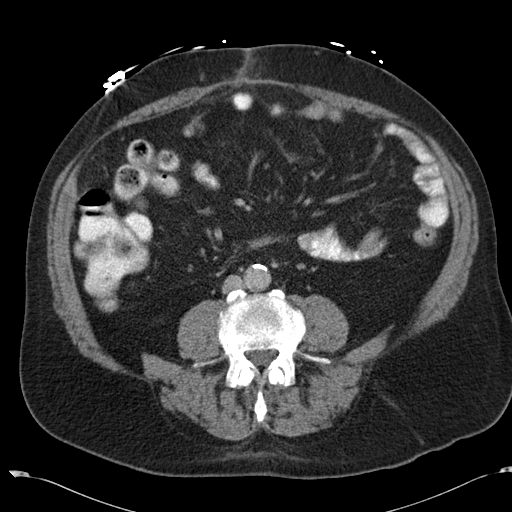
[im 57/99  soft-tissue]
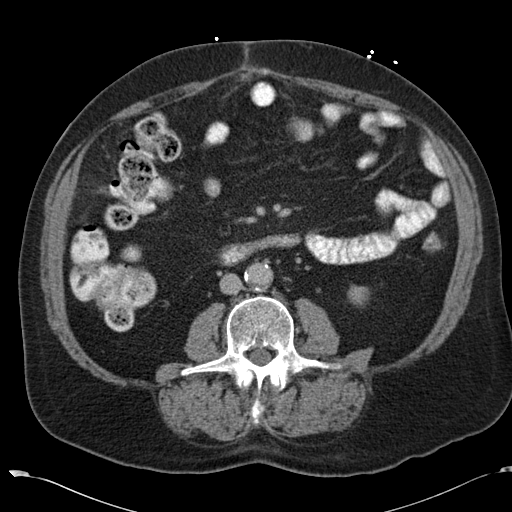
[im 62/99  soft-tissue]
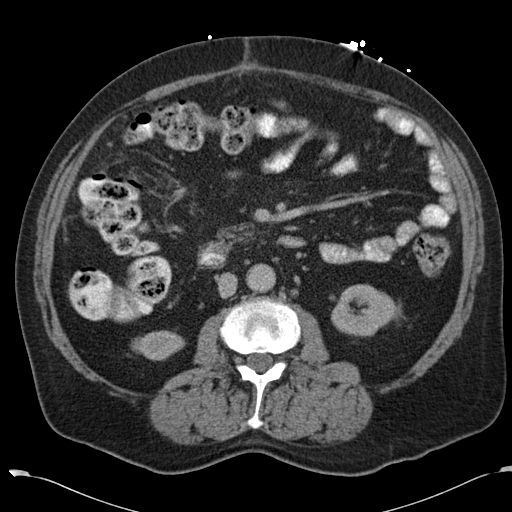
[im 62/99  bone]
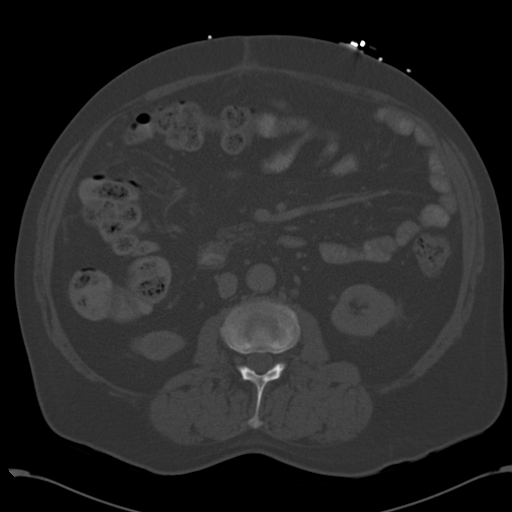
[im 73/99  soft-tissue]
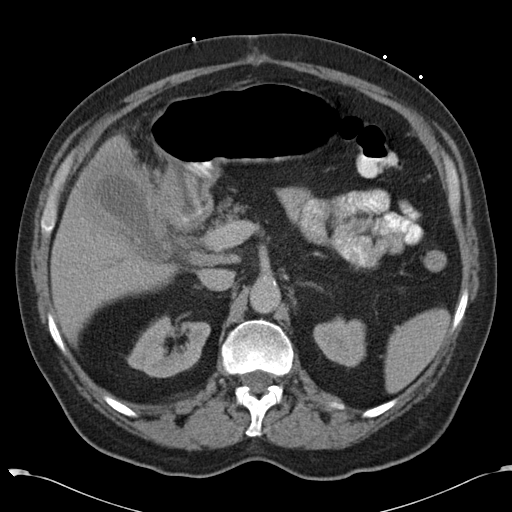
[im 78/99  soft-tissue]
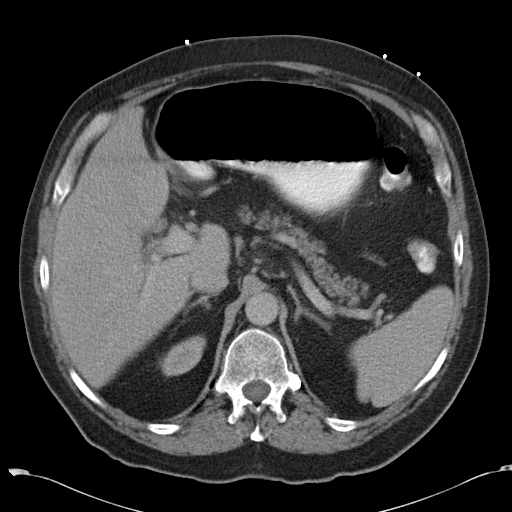
[im 83/99  soft-tissue]
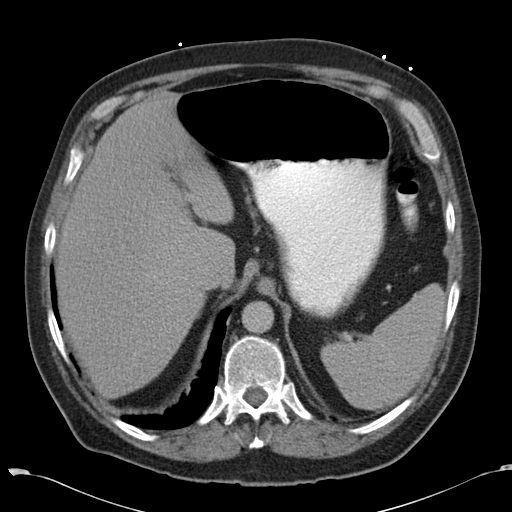
[im 93/99  soft-tissue]
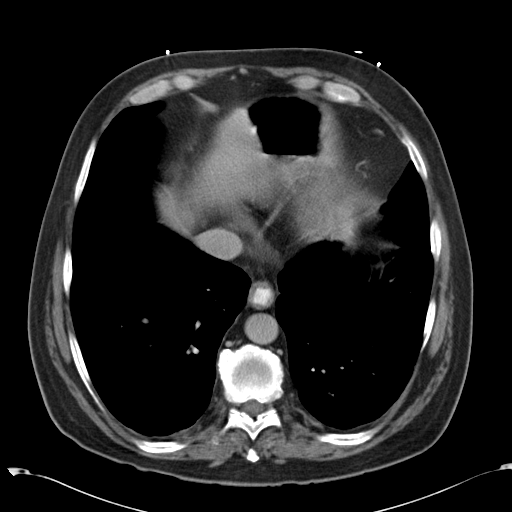

[Series 5: cor routine abd pel with · coronal · 0.74mm/px · 3 of 170 slices shown]
[im 57/170  soft-tissue]
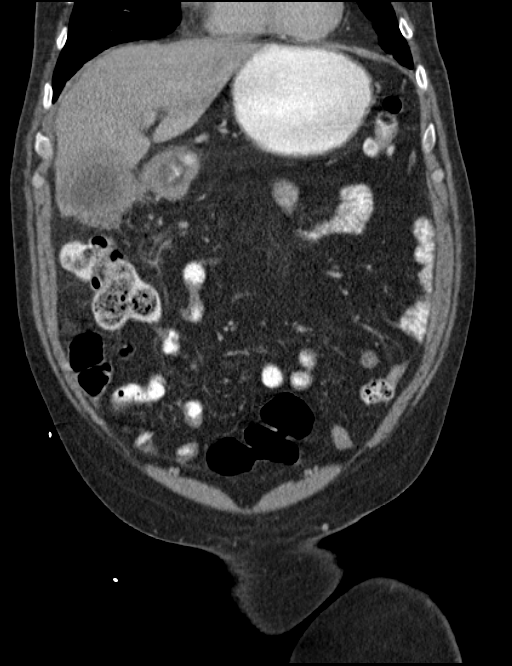
[im 76/170  soft-tissue]
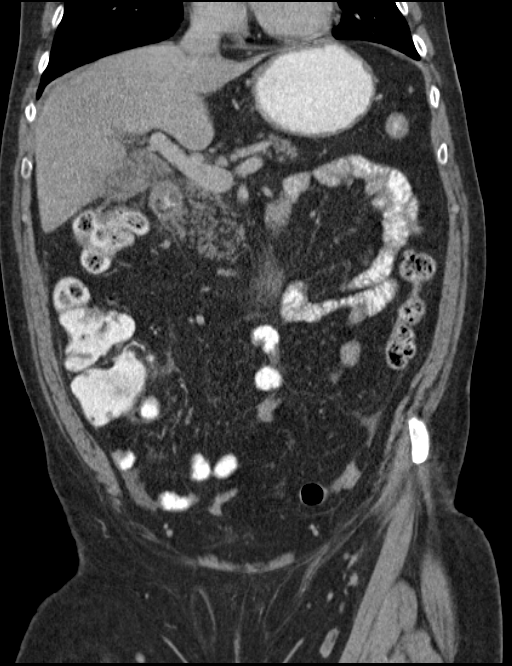
[im 94/170  soft-tissue]
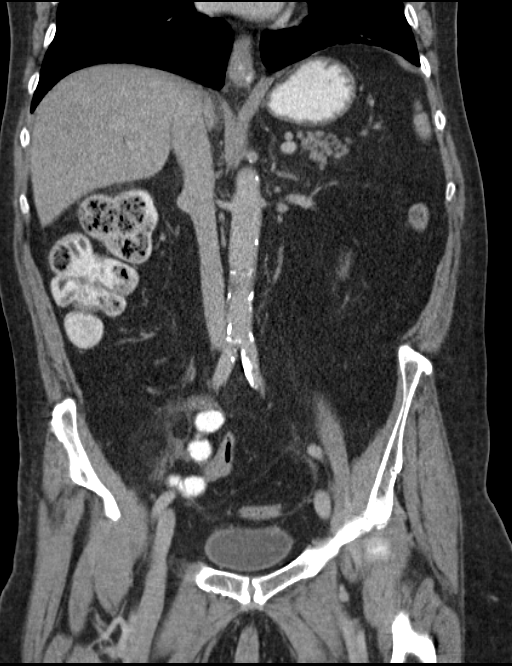

[16 of 46 positions shown; findings below may reference images not displayed]

FINDINGS: Lower chest:  No acute findings.

Hepatobiliary: There is an inflammation around the gallbladder with
gallbladder wall thickening and a 5 mm stone in the gallbladder
neck. There is edema surrounding the gallbladder. The liver is
normal.

Pancreas: No mass, inflammatory changes, or other significant
abnormality.

Spleen: Within normal limits in size and appearance.

Adrenals/Urinary Tract: The adrenal glands and kidneys are normal.
The bladder is normal. No masses identified. No evidence of
hydronephrosis.

Stomach/Bowel: There is mild thickening of bowel wall of the first
and second portion duodenum likely related to the adjacent
inflammation of the gallbladder. No evidence of obstruction, or
abnormal fluid collections. The appendix is normal.

Vascular/Lymphatic: No pathologically enlarged lymph nodes. No
evidence of abdominal aortic aneurysm. There is atherosclerosis of
the abdominal aorta.

Reproductive: No mass or other significant abnormality.

Other: Small free fluid is identified in the pelvis. There are small
bilateral inguinal herniation of mesenteric fat.

Musculoskeletal: Degenerative joint changes of the spine are
identified.
IMPRESSION: Findings consistent with acute cholecystitis.

Mild thickened bowel wall of the first and second portion of
duodenum likely related to adjacent inflammation of the gallbladder.

Small free fluid in the pelvis.

## 2016-12-31 IMAGING — CT CT HEAD W/O CM
3 of 4 series · 16 of 47 positions shown, 19 images · non-contrast
Comparison: March 18, 2016

CLINICAL DATA: Dizziness and confusion after fall.

EXAM:
CT HEAD WITHOUT CONTRAST
TECHNIQUE: Contiguous axial images were obtained from the base of the skull
through the vertex without intravenous contrast.

[Series 2: head wo · axial · 0.41mm/px · z∈[-182,-57]mm · 10 of 31 slices shown, 13 images]
[im 3/31  brain]
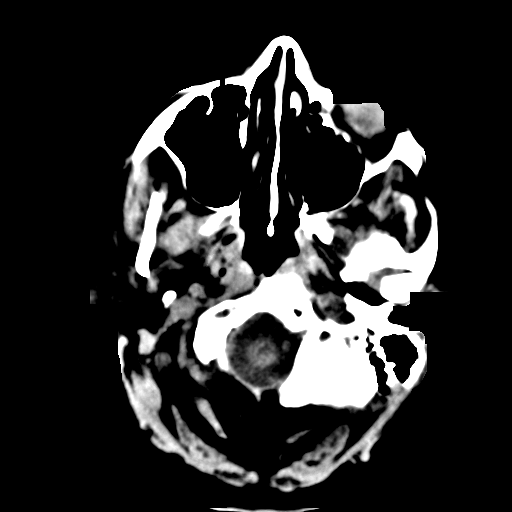
[im 3/31  bone]
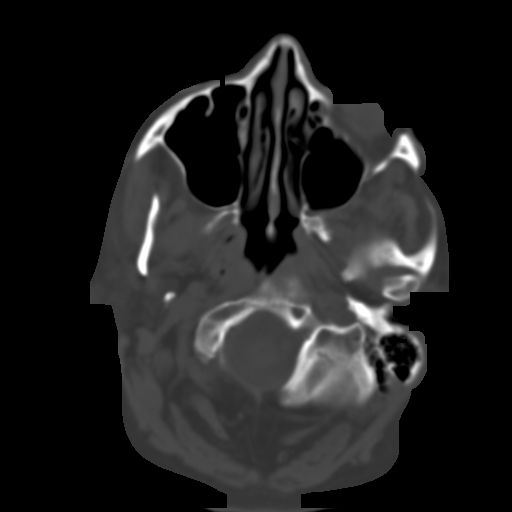
[im 5/31  brain]
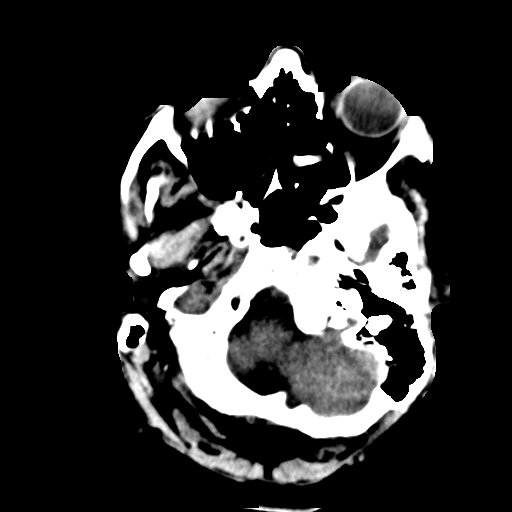
[im 9/31  brain]
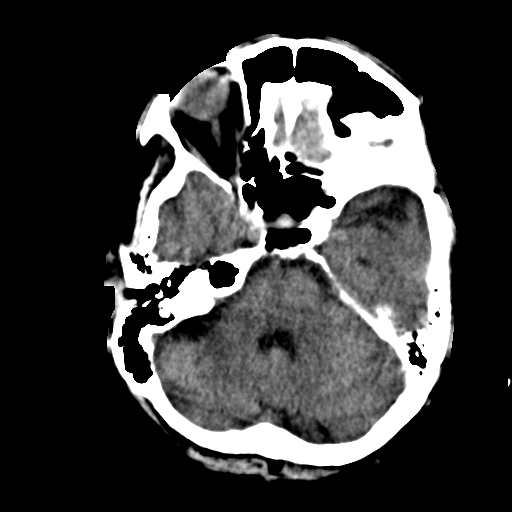
[im 11/31  brain]
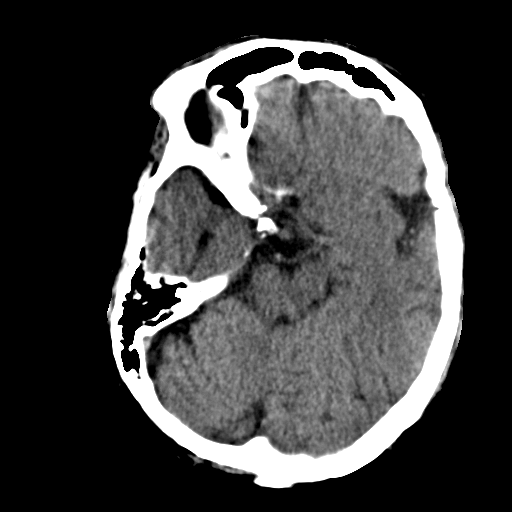
[im 13/31  brain]
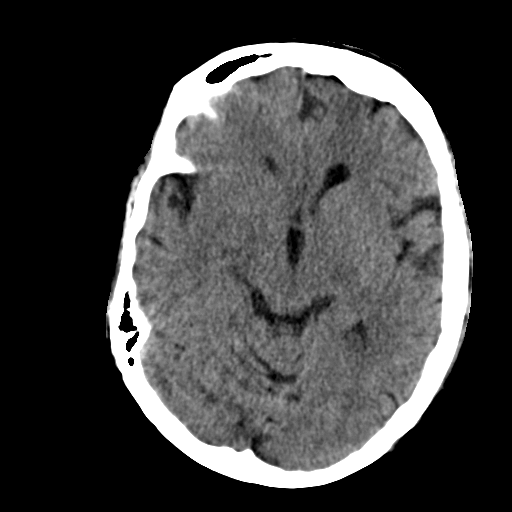
[im 13/31  bone]
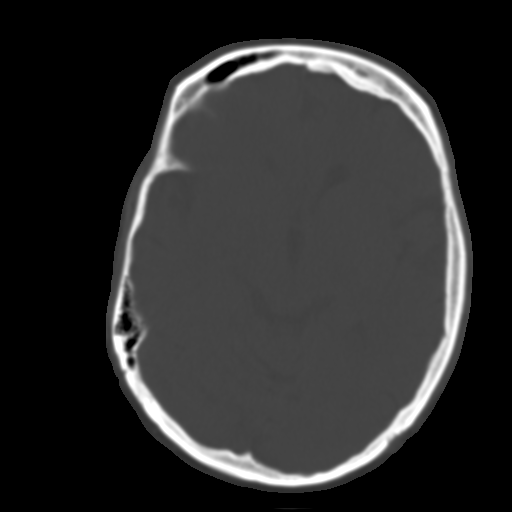
[im 18/31  brain]
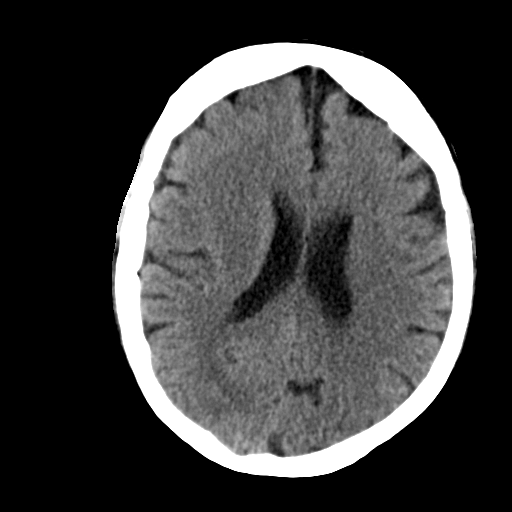
[im 20/31  brain]
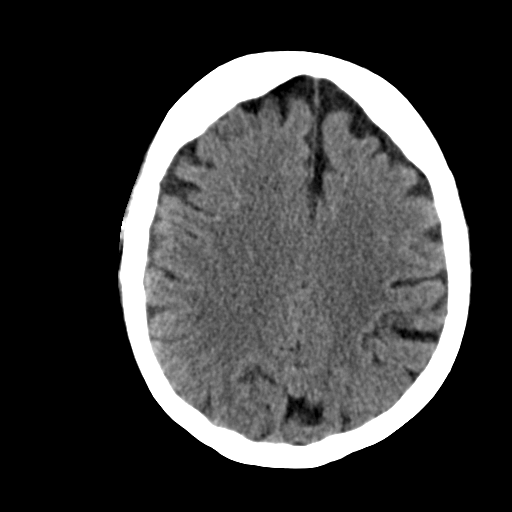
[im 22/31  brain]
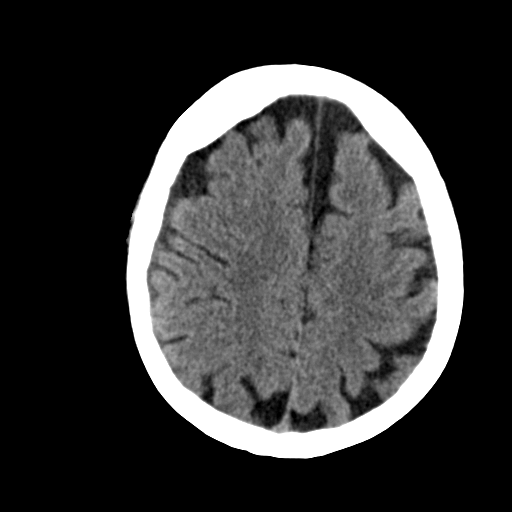
[im 26/31  brain]
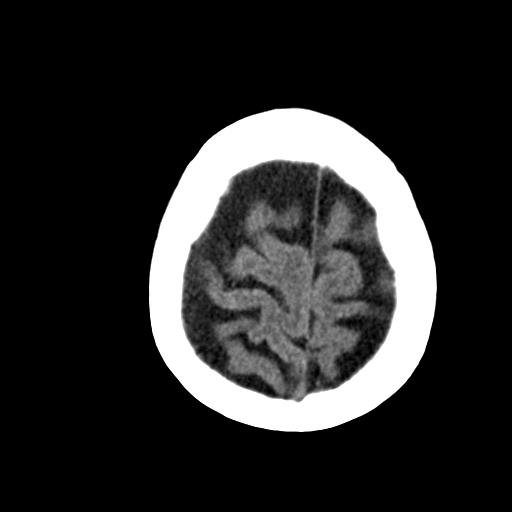
[im 26/31  bone]
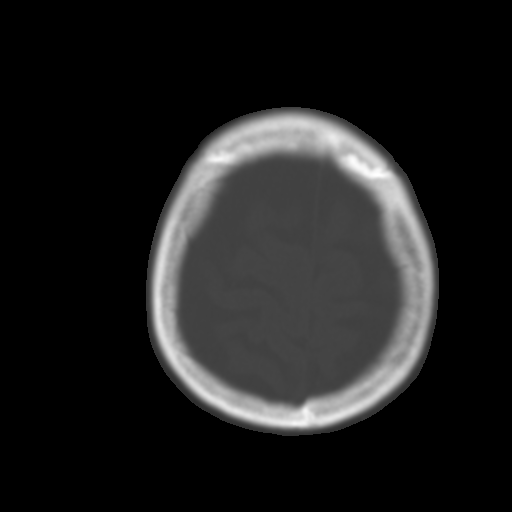
[im 28/31  brain]
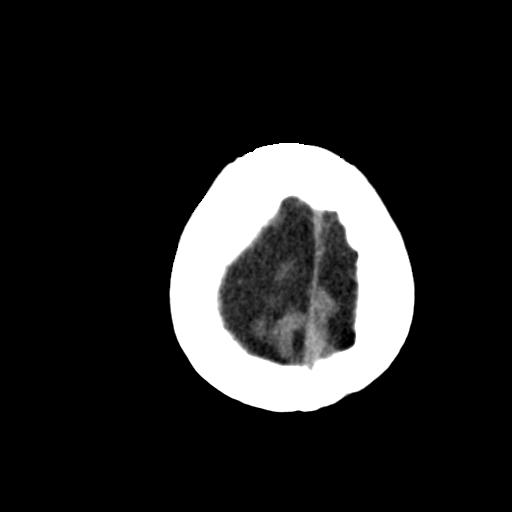

[Series 6: coronal soft · coronal · 0.31mm/px · 3 of 67 slices shown]
[im 23/67  brain]
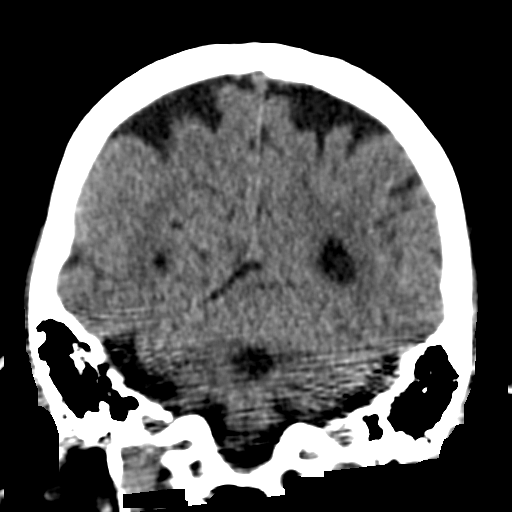
[im 30/67  brain]
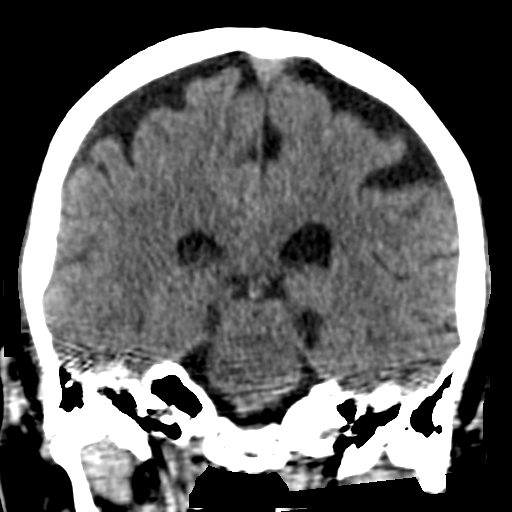
[im 37/67  brain]
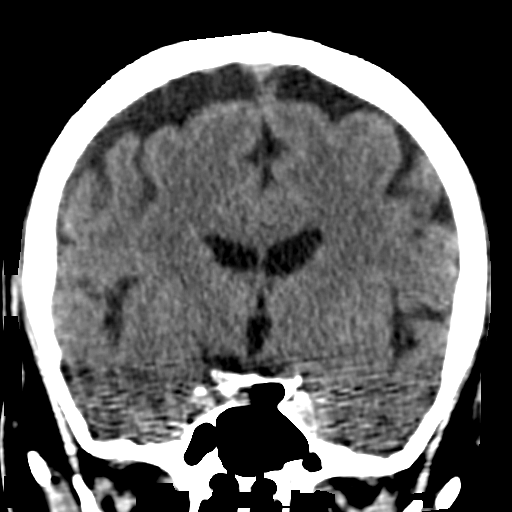

[Series 7: sagittal soft · sagittal · 0.32mm/px · 3 of 54 slices shown]
[im 18/54  brain]
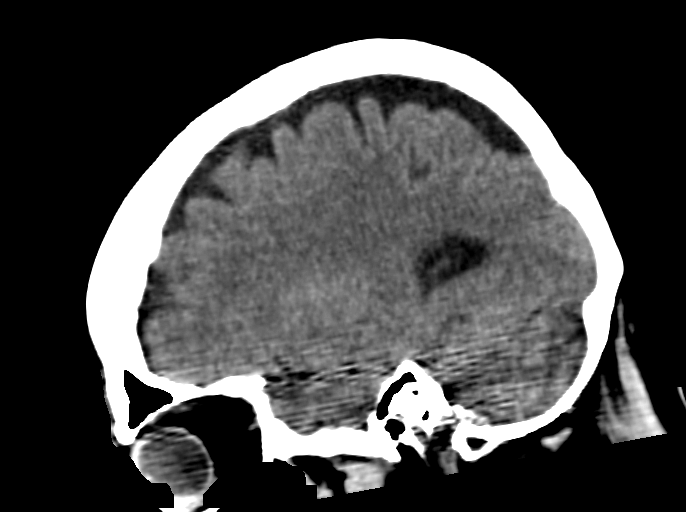
[im 27/54  brain]
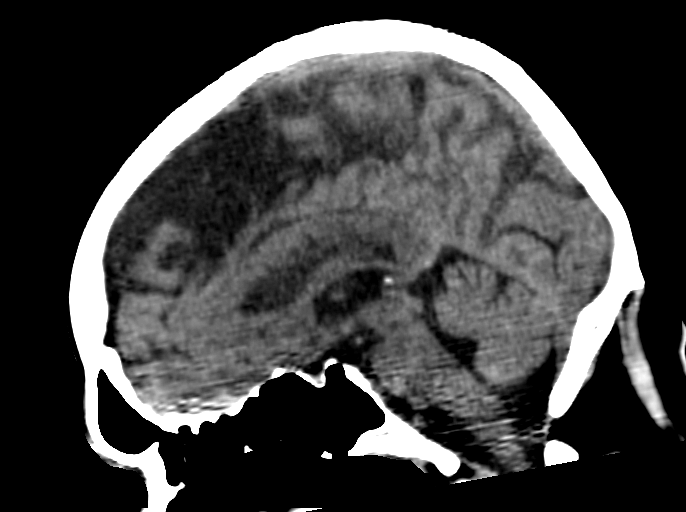
[im 36/54  brain]
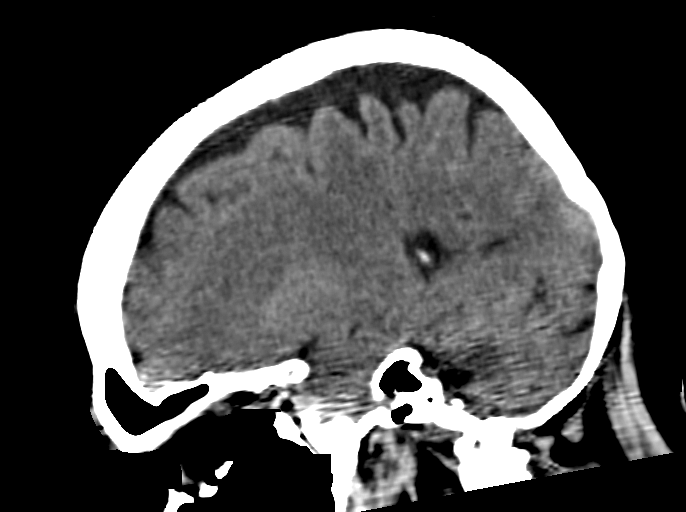

[16 of 47 positions shown; findings below may reference images not displayed]

FINDINGS: Mild generalized atrophy is stable. There is no intracranial mass,
hemorrhage, extra-axial fluid collection, or midline shift.
Gray-white compartments appear unremarkable. No focal gray-white
compartment lesions are identified. No acute infarct evident. Bony
calvarium appears intact. The mastoid air cells are clear. No
intraorbital lesions are evident.
IMPRESSION: Atrophy, stable. No intracranial mass, hemorrhage, or extra-axial
fluid collection. No focal gray - white compartment lesions.

## 2017-01-25 ENCOUNTER — Encounter: Payer: Self-pay | Admitting: Psychiatry

## 2017-01-25 ENCOUNTER — Ambulatory Visit (INDEPENDENT_AMBULATORY_CARE_PROVIDER_SITE_OTHER): Payer: Medicare HMO | Admitting: Psychiatry

## 2017-01-25 VITALS — BP 111/64 | HR 90 | Temp 97.4°F | Wt 244.6 lb

## 2017-01-25 DIAGNOSIS — F200 Paranoid schizophrenia: Secondary | ICD-10-CM | POA: Diagnosis not present

## 2017-01-25 MED ORDER — PAROXETINE HCL 30 MG PO TABS: 15.0000 mg | ORAL_TABLET | Freq: Every day | ORAL | 2 refills | Status: DC

## 2017-01-25 MED ORDER — RISPERIDONE 4 MG PO TABS
4.0000 mg | ORAL_TABLET | Freq: Every day | ORAL | 2 refills | Status: DC
Start: 2017-01-25 — End: 2017-04-06

## 2017-01-25 NOTE — Progress Notes (Signed)
Patient ID: [name], male   DOB: 10/21/1951, 66 y.o.   MRN: [id number]  [facility] MD/PA/NP OP Progress Note  01/25/2017 1:12 PM [name]  MRN:  [id number]  Subjective:  Patient is a 66 year old white male with a history of schizophrenia.  Patient is presents today with his sister for a follow-up appointment.  Patient today reports that he's been doing quite well. Has been taking his medications regularly. Denies any side effects. Patient's sister reports that [name] they'll now have to pay a higher co-pay for the Zyprexa and she would like to see this as any other medication that would be comparable. We discussed starting him on a Seroquel titration and she is agreeable to that. Denies hearing voices or seeing things. Denies any suicidal ideations.   Chief Complaint:  Chief Complaint    Follow-up; Medication Refill     Doing well Visit Diagnosis:     ICD-9-CM ICD-10-CM   1. Paranoid schizophrenia ([place]) 295.30 F20.0     Past Medical History:  Past Medical History:  Diagnosis Date  . Chicken pox   . Dizziness   . Measles   . Mumps   . Schizophrenia [facility])     Past Surgical History:  Procedure Laterality Date  . ABDOMINAL SURGERY    . COLON SURGERY    . LAPAROTOMY N/A 03/20/2016   Procedure: EXPLORATORY LAPAROTOMY with lysis of adhesions;  Surgeon: Bradley Husbands, MD;  Location: [facility] ORS;  Service: General;  Laterality: N/A;  . None     Family History:  Family History  Problem Relation Age of Onset  . Diabetes Mother   . Heart disease Mother   . Stroke Mother   . Diabetes Father   . Hypertension Father   . Lung cancer Father   . Cancer Father     lung cancer  . Schizophrenia Sister   . Lung cancer Sister   . Colon cancer Sister    Social History:  Social History   Social History  . Marital status: Single    Spouse name: N/A  . Number of children: 2  . Years of education: N/A   Occupational History  . Disabled    Social History Main Topics  . Smoking status:  Former Smoker    Types: Cigarettes    Quit date: 08/04/2004  . Smokeless tobacco: Former Systems developer    Quit date: 08/04/2004  . Alcohol use No  . Drug use: No  . Sexual activity: Not Currently   Other Topics Concern  . None   Social History Narrative  . None   Additional History:   Assessment:   Musculoskeletal: Strength & Muscle Tone: within normal limits Gait & Station: normal Patient leans: N/A  Psychiatric Specialty Exam: Medication Refill     Review of Systems  Psychiatric/Behavioral: Negative for depression, hallucinations, memory loss, substance abuse and suicidal ideas. The patient is not nervous/anxious and does not have insomnia.   All other systems reviewed and are negative.   Blood pressure 111/64, pulse 90, temperature 97.4 F (36.3 C), temperature source Oral, weight 244 lb 9.6 oz (110.9 kg).Body mass index is 33.17 kg/m.  General Appearance: Neat and Well Groomed  Eye Contact:  Good  Speech:  Normal Rate  Volume:  Normal  Mood:  Good   Affect:  Appropriate and Congruent  Thought Process:  Linear and at times concrete  Orientation:  Full (Time, Place, and Person)  Thought Content:  Negative  Suicidal Thoughts:  No  Homicidal Thoughts:  No  Memory:  Immediate;   Good Recent;   Good Remote;   Good  Judgement:  Good  Insight:  Good  Psychomotor Activity:  Negative  Concentration:  Good  Recall:  Good  Fund of Knowledge: Fair  Language: Fair  Akathisia:  Negative  Handed:  Right unknown   AIMS (if indicated):  On 10/26/2016, normal, no rigidity or cog wheeling  Assets:  Desire for Improvement Social Support  ADL's:  Intact  Cognition: WNL  Sleep:  good   Is the patient at risk to self?  No. Has the patient been a risk to self in the past 6 months?  No. Has the patient been a risk to self within the distant past?  No. Is the patient a risk to others?  No. Has the patient been a risk to others in the past 6 months?  No. Has the patient been a risk  to others within the distant past?  No.  Current Medications: Current Outpatient Prescriptions  Medication Sig Dispense Refill  . alum & mag hydroxide-simeth (MAALOX/MYLANTA) 200-200-20 MG/5ML suspension Take 30 mLs by mouth every 6 (six) hours as needed for indigestion or heartburn.    . fludrocortisone (FLORINEF) 0.1 MG tablet TAKE 1 TABLET BY MOUTH EVERY DAY. 30 tablet 5  . midodrine (PROAMATINE) 10 MG tablet TAKE 1 TABLET BY MOUTH 3 TIMES A DAY WITH MEALS 90 tablet 4  . OLANZapine (ZYPREXA) 15 MG tablet Take 2 tablets (30 mg total) by mouth at bedtime. Take 2 tablets by mouth at bedtime 60 tablet 2  . PARoxetine (PAXIL) 30 MG tablet Take 0.5 tablets (15 mg total) by mouth at bedtime. 15 tablet 2  . polyethylene glycol powder (GLYCOLAX/MIRALAX) powder     . risperidone (RISPERDAL) 4 MG tablet Take 1 tablet (4 mg total) by mouth at bedtime. 30 tablet 2   No current facility-administered medications for this visit.     Medical Decision Making:  Established Problem, Stable/Improving (1), Review of Medication Regimen & Side Effects (2) and Review of New Medication or Change in Dosage (2)  Treatment Plan Summary:Medication management and Plan   Discontinue Zyprexa  Schizophrenia-stable. Start Seroquel At 150 mg daily at bedtime for one week, increase to 200 mg daily. Patient's sister to give a call to this clinician to discuss how he is doing at this dose and if needed we will increase the Seroquel to 300 mg daily  Continue risperidone at 4 mg, at bedtime, paroxetine 15 mg daily,   Patient and his sister were educated about the incidence of side effects and long-term movement disorders being high with combination of Zyprexa and Risperdal. Also discussed metabolic disturbances with the combination of these medications. However they stated that patient has needed this combination to be able to function.   Patient will follow up in 2 months. They've been encouraged to call with any questions  or concerns prior to the next appointment.   [name] 01/25/2017, 1:12 PM

## 2017-01-26 ENCOUNTER — Telehealth: Payer: Self-pay

## 2017-01-26 NOTE — Telephone Encounter (Signed)
Ok can be restarted on Zyprexa 30 mg at bedtime. She will need to return to samples of Seroquel given to her

## 2017-01-26 NOTE — Telephone Encounter (Signed)
Okay 

## 2017-01-26 NOTE — Telephone Encounter (Signed)
pt daughter came by office states that she doesnot want him on seroquel that she wants him back on ZYPREXA.

## 2017-01-26 NOTE — Telephone Encounter (Signed)
spoke with pharmist and she states that they dont make it in 30mg  that they will do the 15mg  take 2 at bedtime.

## 2017-01-27 ENCOUNTER — Telehealth: Payer: Self-pay

## 2017-01-27 NOTE — Telephone Encounter (Signed)
got insurance pa approved for over the # allowed.  pa was approved until 12-12-17.

## 2017-01-27 NOTE — Telephone Encounter (Signed)
[name] called states that they insurance will pay for the zyprexa if we call the insurance and get a pa for over the number allowed for medication  call [phone].

## 2017-01-28 ENCOUNTER — Other Ambulatory Visit: Payer: Self-pay | Admitting: Family Medicine

## 2017-01-28 MED ORDER — POLYETHYLENE GLYCOL 3350 17 GM/SCOOP PO POWD: 17.0000 g | Freq: Every day | ORAL | 12 refills | Status: DC

## 2017-01-31 ENCOUNTER — Telehealth: Payer: Self-pay

## 2017-01-31 NOTE — Telephone Encounter (Signed)
Needs rx for zyprexa sent into pharmacy since he is not doing the seroquel. 90 day supply

## 2017-02-01 ENCOUNTER — Other Ambulatory Visit (HOSPITAL_COMMUNITY): Payer: Self-pay | Admitting: Psychiatry

## 2017-02-01 MED ORDER — OLANZAPINE 15 MG PO TABS: 30.0000 mg | ORAL_TABLET | Freq: Every day | ORAL | 0 refills | Status: DC

## 2017-02-01 NOTE — Telephone Encounter (Signed)
done

## 2017-02-16 ENCOUNTER — Encounter: Payer: Self-pay | Admitting: Family Medicine

## 2017-02-16 ENCOUNTER — Ambulatory Visit (INDEPENDENT_AMBULATORY_CARE_PROVIDER_SITE_OTHER): Payer: Medicare HMO | Admitting: Family Medicine

## 2017-02-16 VITALS — BP 130/84 | HR 77 | Temp 97.7°F | Resp 16 | Ht 72.0 in | Wt 250.0 lb

## 2017-02-16 DIAGNOSIS — E8809 Other disorders of plasma-protein metabolism, not elsewhere classified: Secondary | ICD-10-CM | POA: Diagnosis not present

## 2017-02-16 DIAGNOSIS — E785 Hyperlipidemia, unspecified: Secondary | ICD-10-CM | POA: Diagnosis not present

## 2017-02-16 DIAGNOSIS — Z789 Other specified health status: Secondary | ICD-10-CM

## 2017-02-16 DIAGNOSIS — Z125 Encounter for screening for malignant neoplasm of prostate: Secondary | ICD-10-CM | POA: Diagnosis not present

## 2017-02-16 DIAGNOSIS — Z1159 Encounter for screening for other viral diseases: Secondary | ICD-10-CM

## 2017-02-16 DIAGNOSIS — Z86010 Personal history of colonic polyps: Secondary | ICD-10-CM | POA: Diagnosis not present

## 2017-02-16 DIAGNOSIS — Z0000 Encounter for general adult medical examination without abnormal findings: Secondary | ICD-10-CM

## 2017-02-16 DIAGNOSIS — Z860101 Personal history of adenomatous and serrated colon polyps: Secondary | ICD-10-CM

## 2017-02-16 NOTE — Progress Notes (Signed)
Patient: [name], Male    DOB: 06-Mar-1951, 66 y.o.   MRN: [id number] Visit Date: 02/16/2017  Today's Provider: Lelon Huh, MD   Chief Complaint  Patient presents with  . Annual Exam  . Hypotension    follow up  . Anemia    follow up  . Schizophrenia    follow up   Subjective:    Annual physical [name] is a 66 y.o. male. He feels well. He reports never exercising. He reports he is sleeping well.  -----------------------------------------------------------  Hypotension, follow-up:  BP Readings from Last 3 Encounters:  09/20/16 104/64  06/18/16 104/64  06/03/16 113/65    He was last seen for hypotension 5 months ago.  BP at that visit was 104/64. Management since that visit includes counseling patient on the importance of standing slowly and using support when standing. He reports fair compliance with treatment. Sometimes forgets to take medications. He is not having side effects.  He is not exercising. He is not adherent to low salt diet.   Outside blood pressures are 111/ 60. He is experiencing none.  Patient denies chest pain, chest pressure/discomfort, claudication, dyspnea, exertional chest pressure/discomfort, fatigue, irregular heart beat, lower extremity edema, near-syncope, orthopnea, palpitations, paroxysmal nocturnal dyspnea, syncope and tachypnea.   Cardiovascular risk factors include advanced age (older than 21 for men, 98 for women) and male gender.  Use of agents associated with hypertension: none.     Weight trend: fluctuating a bit Wt Readings from Last 3 Encounters:  09/20/16 237 lb (107.5 kg)  06/18/16 236 lb (107 kg)  06/03/16 223 lb (101.2 kg)    Current diet: well balanced  ------------------------------------------------------------------------ Follow up Anemia:  Patient was last seen for this problem 9 months ago and no changes were made.  Follow up of Schizophrenia:  This problem is followed by psychiatry in  Behavioral health.    Review of Systems  Constitutional: Positive for unexpected weight change. Negative for appetite change, chills, fatigue and fever.  HENT: Negative for congestion, ear pain, hearing loss, nosebleeds and trouble swallowing.   Eyes: Negative for pain and visual disturbance.  Respiratory: Negative for cough, chest tightness and shortness of breath.   Cardiovascular: Negative for chest pain, palpitations and leg swelling.  Gastrointestinal: Negative for abdominal pain, blood in stool, constipation, diarrhea, nausea and vomiting.  Endocrine: Negative for polydipsia, polyphagia and polyuria.  Genitourinary: Negative for dysuria and flank pain.       Retraction of penile shaft  Musculoskeletal: Negative for arthralgias, back pain, joint swelling, myalgias and neck stiffness.  Skin: Negative for color change, rash and wound.  Neurological: Positive for dizziness (when standing). Negative for tremors, seizures, speech difficulty, weakness, light-headedness and headaches.  Psychiatric/Behavioral: Negative for behavioral problems, confusion, decreased concentration, dysphoric mood and sleep disturbance. The patient is not nervous/anxious.   All other systems reviewed and are negative.   Social History   Social History  . Marital status: Single    Spouse name: N/A  . Number of children: 2  . Years of education: N/A   Occupational History  . Disabled    Social History Main Topics  . Smoking status: Former Smoker    Types: Cigarettes    Quit date: 08/04/2004  . Smokeless tobacco: Former Systems developer    Quit date: 08/04/2004  . Alcohol use No  . Drug use: No  . Sexual activity: Not Currently   Other Topics Concern  . Not on file  Social History Narrative  . No narrative on file    Past Medical History:  Diagnosis Date  . Chicken pox   . Dizziness   . Measles   . Mumps   . Schizophrenia [facility])      Patient Active Problem List   Diagnosis Date Noted  .  Cholecystitis 05/18/2016  . Chronic nausea 05/17/2016  . Hypoalbuminemia 05/14/2016  . Anemia 05/11/2016  . Hypotension 05/04/2016  . SBO (small bowel obstruction) 03/18/2016  . Dizziness 05/16/2015  . Edema 05/15/2015  . Venous stasis dermatitis 05/15/2015  . Schizophrenia, simple, chronic ([place]) 08/21/2009  . Kidney Disease, Chronic stage II (mild) 08/20/2009  . Hyperlipemia 08/19/2009    Past Surgical History:  Procedure Laterality Date  . ABDOMINAL SURGERY    . COLON SURGERY    . LAPAROTOMY N/A 03/20/2016   Procedure: EXPLORATORY LAPAROTOMY with lysis of adhesions;  Surgeon: Jules Husbands, MD;  Location: [facility] ORS;  Service: General;  Laterality: N/A;  . None      His family history includes Cancer in his father; Colon cancer in his sister; Diabetes in his father and mother; Heart disease in his mother; Hypertension in his father; Lung cancer in his father and sister; Schizophrenia in his sister; Stroke in his mother.      Current Outpatient Prescriptions:  .  alum & mag hydroxide-simeth (MAALOX/MYLANTA) 200-200-20 MG/5ML suspension, Take 30 mLs by mouth every 6 (six) hours as needed for indigestion or heartburn., Disp: , Rfl:  .  fludrocortisone (FLORINEF) 0.1 MG tablet, TAKE 1 TABLET BY MOUTH EVERY DAY., Disp: 30 tablet, Rfl: 5 .  midodrine (PROAMATINE) 10 MG tablet, TAKE 1 TABLET BY MOUTH 3 TIMES A DAY WITH MEALS, Disp: 90 tablet, Rfl: 4 .  OLANZapine (ZYPREXA) 15 MG tablet, Take 2 tablets (30 mg total) by mouth at bedtime., Disp: 180 tablet, Rfl: 0 .  PARoxetine (PAXIL) 30 MG tablet, Take 0.5 tablets (15 mg total) by mouth at bedtime., Disp: 15 tablet, Rfl: 2 .  polyethylene glycol powder (GLYCOLAX/MIRALAX) powder, Take 17 g by mouth daily., Disp: 850 g, Rfl: 12 .  risperidone (RISPERDAL) 4 MG tablet, Take 1 tablet (4 mg total) by mouth at bedtime., Disp: 30 tablet, Rfl: 2  Patient Care Team: Birdie Sons, MD as PCP - General (Family Medicine) Marjie Skiff, MD as  Consulting Physician (Psychiatry)     Objective:   Vitals: BP 130/84 (BP Location: Left Arm, Patient Position: Sitting, Cuff Size: Large)   Pulse 77   Temp 97.7 F (36.5 C) (Oral)   Resp 16   Ht 6' (1.829 m)   Wt 250 lb (113.4 kg)   SpO2 99% Comment: room air  BMI 33.91 kg/m   Physical Exam   General Appearance:    Alert, cooperative, no distress, appears stated age  Head:    Normocephalic, without obvious abnormality, atraumatic  Eyes:    PERRL, conjunctiva/corneas clear, EOM's intact, fundi    benign, both eyes       Ears:    Normal TM's and external ear canals, both ears  Nose:   Nares normal, septum midline, mucosa normal, no drainage   or sinus tenderness  Throat:   Lips, mucosa, and tongue normal; teeth and gums normal  Neck:   Supple, symmetrical, trachea midline, no adenopathy;       thyroid:  No enlargement/tenderness/nodules; no carotid   bruit or JVD  Back:     Symmetric, no curvature, ROM normal, no CVA tenderness  Lungs:     Clear to auscultation bilaterally, respirations unlabored  Chest wall:    No tenderness or deformity  Heart:    Regular rate and rhythm, S1 and S2 normal, no murmur, rub   or gallop  Abdomen:     Soft, non-tender, bowel sounds active all four quadrants,    no masses, no organomegaly  Genitalia:    normal, penis: no lesions or discharge. testes: no masses or tenderness. no hernias Uncircumcised   Rectal:    deferred  Extremities:   Extremities normal, atraumatic, no cyanosis or edema  Pulses:   2+ and symmetric all extremities  Skin:   Skin color, texture, turgor normal, no rashes or lesions  Lymph nodes:   Cervical, supraclavicular, and axillary nodes normal  Neurologic:   CNII-XII intact. Normal strength, sensation and reflexes      throughout    Activities of Daily Living In your present state of health, do you have any difficulty performing the following activities: 02/16/2017 05/18/2016  Hearing? N N  Vision? N Y  Difficulty  concentrating or making decisions? Tempie Donning  Walking or climbing stairs? N Y  Dressing or bathing? N N  Doing errands, shopping? Y N  Some recent data might be hidden    Fall Risk Assessment Fall Risk  09/20/2016  Falls in the past year? Yes  Number falls in past yr: 1  Injury with Fall? No  Follow up Falls prevention discussed     Depression Screen PHQ 2/9 Scores 02/16/2017 02/16/2017  PHQ - 2 Score 0 0  PHQ- 9 Score 0 -    Cognitive Testing - 6-CIT  Correct? Score   What year is it? yes 0 0 or 4  What month is it? yes 0 0 or 3  Memorize:    Pia Mau,  40,  [place],      What time is it? (within 1 hour) yes 0 0 or 3  Count backwards from 20 yes 0 0, 2, or 4  Name the months of the year no 2 0, 2, or 4  Repeat name & address above no 4 0, 2, 4, 6, 8, or 10       TOTAL SCORE  6/28   Interpretation:  Normal  Normal (0-7) Abnormal (8-28)    Audit-C Alcohol Use Screening  Question Answer Points  How often do you have alcoholic drink? never 0  On days you do drink alcohol, how many drinks do you typically consume? n/a 0  How oftey will you drink 6 or more in a total? never 0  Total Score:  0   A score of 3 or more in women, and 4 or more in men indicates increased risk for alcohol abuse, EXCEPT if all of the points are from question 1.  Current Exercise Habits: The patient does not participate in regular exercise at present Exercise limited by: None identified   Assessment & Plan:    Annual Physical Reviewed patient's Family Medical History Reviewed and updated list of patient's medical providers Assessment of cognitive impairment was done Assessed patient's functional ability Established a written schedule for health screening [place] Completed and Reviewed  Exercise Activities and Dietary recommendations Goals    None      Immunization History  Administered Date(s) Administered  . Influenza, High Dose Seasonal PF 09/20/2016    . Pneumococcal Conjugate-13 05/16/2015  . Pneumococcal Polysaccharide-23 09/20/2016    Health Maintenance  Topic  Date Due  . Hepatitis C Screening  02-Sep-1951  . TETANUS/TDAP  06/28/1968  . COLONOSCOPY  06/29/1999  . INFLUENZA VACCINE  Completed  . PNA vac Low Risk Adult  Completed     Discussed health benefits of physical activity, and encouraged him to engage in regular exercise appropriate for his age and condition.    ------------------------------------------------------------------------------------------------------------ 1. Annual physical exam Generally doing well.   2. Hyperlipidemia, unspecified hyperlipidemia type Currently diet controlled.  - Comprehensive metabolic panel - Lipid panel  3. Need for hepatitis C screening test  - Hepatitis C antibody  4. Prostate cancer screening  - PSA  5. Uncircumcised male He is concerned that penis tends to draw up into abdomen when showering, but no other urinary symptoms. Reassured that this is normal and is aggravated by presence of foreskin in uncircumcised men.   6. History of adenomatous polyp of colon     Lelon Huh, MD  [facility]

## 2017-02-17 ENCOUNTER — Telehealth: Payer: Self-pay

## 2017-02-17 LAB — COMPREHENSIVE METABOLIC PANEL
A/G RATIO: 1.6 (ref 1.2–2.2)
ALT: 17 IU/L (ref 0–44)
AST: 19 IU/L (ref 0–40)
Albumin: 4.3 g/dL (ref 3.6–4.8)
Alkaline Phosphatase: 104 IU/L (ref 39–117)
BILIRUBIN TOTAL: 0.3 mg/dL (ref 0.0–1.2)
BUN/Creatinine Ratio: 11 (ref 10–24)
BUN: 18 mg/dL (ref 8–27)
CALCIUM: 9.5 mg/dL (ref 8.6–10.2)
CO2: 27 mmol/L (ref 18–29)
Chloride: 101 mmol/L (ref 96–106)
Creatinine, Ser: 1.64 mg/dL — ABNORMAL HIGH (ref 0.76–1.27)
GFR, EST AFRICAN AMERICAN: 49 mL/min/{1.73_m2} — AB (ref >59)
GFR, EST NON AFRICAN AMERICAN: 43 mL/min/{1.73_m2} — AB (ref >59)
GLOBULIN, TOTAL: 2.7 g/dL (ref 1.5–4.5)
Glucose: 94 mg/dL (ref 65–99)
POTASSIUM: 4.9 mmol/L (ref 3.5–5.2)
SODIUM: 142 mmol/L (ref 134–144)
Total Protein: 7 g/dL (ref 6.0–8.5)

## 2017-02-17 LAB — LIPID PANEL
CHOL/HDL RATIO: 6.2 ratio — AB (ref 0.0–5.0)
Cholesterol, Total: 235 mg/dL — ABNORMAL HIGH (ref 100–199)
HDL: 38 mg/dL — ABNORMAL LOW (ref >39)
LDL Calculated: 142 mg/dL — ABNORMAL HIGH (ref 0–99)
Triglycerides: 275 mg/dL — ABNORMAL HIGH (ref 0–149)
VLDL Cholesterol Cal: 55 mg/dL — ABNORMAL HIGH (ref 5–40)

## 2017-02-17 LAB — PSA: PROSTATE SPECIFIC AG, SERUM: 1 ng/mL (ref 0.0–4.0)

## 2017-02-17 LAB — HEPATITIS C ANTIBODY

## 2017-02-17 MED ORDER — PRAVASTATIN SODIUM 40 MG PO TABS: 40.0000 mg | ORAL_TABLET | Freq: Every day | ORAL | 3 refills | Status: DC

## 2017-02-17 NOTE — Telephone Encounter (Signed)
Pt's sister advised.  RX sent to [facility].  She says they will call back to schedule the appointment.    Thanks,   -Mickel Baas

## 2017-02-17 NOTE — Telephone Encounter (Signed)
-----   Message from Birdie Sons, MD sent at 02/17/2017  8:11 AM EST ----- Cholesterol is high at 235. Should be under 200. Recommend starting pravastatin 40mg  once a day, #30, rf x 3. Cut back on red meats  (beef and pork). Avoid whole milk. Follow up 3-4 months.  Rest of labs are normal.

## 2017-02-18 ENCOUNTER — Telehealth: Payer: Self-pay | Admitting: Family Medicine

## 2017-02-18 NOTE — Telephone Encounter (Signed)
Pt sister [name] is requesting a Rx for fish oil with omega 3.  She states pt has taking this over the counter but it will be cheaper to have a Rx.  [facility].  CB#[phone]/MW

## 2017-02-19 NOTE — Telephone Encounter (Signed)
There is no need to continue fish oil since we have started him on pravastatin.

## 2017-02-21 NOTE — Telephone Encounter (Signed)
He needs to be taking pravastatin, NOT fish oil.

## 2017-02-21 NOTE — Telephone Encounter (Signed)
[name] stated that she did not start patient on the pravastatin. Patient has been taking fish oil instead. Please advise?

## 2017-03-01 NOTE — Telephone Encounter (Signed)
Patients sister [name] advised and she verbally voiced understanding. She agrees to start patient in Pravastatin as prescribed.

## 2017-03-22 ENCOUNTER — Ambulatory Visit: Payer: Medicare HMO | Admitting: Psychiatry

## 2017-04-04 ENCOUNTER — Telehealth: Payer: Self-pay

## 2017-04-04 NOTE — Telephone Encounter (Signed)
pt and his sister came into office today.  they made appt for wednesday because she states he is not doing good.  she states you took him off two of his medication and that he is getting worse.   I pulled up last not and I seen where you discontinued zyprexa and started seroquel but according to patient and his sister it was two medication you discontinued and they didn't know anything about seroquel.  I called there pharmacy to see if it was there and nothing has been sent in for seroquel.  I also did not see it in the medication list.

## 2017-04-05 NOTE — Telephone Encounter (Signed)
Pt sister was called and she stated that she made appt for tomorrow to go over everything and she was told to bring all medication to the appt. So we can make sure what patient is taking.Marland Kitchen

## 2017-04-05 NOTE — Telephone Encounter (Signed)
Patient was given samples of Seroquel back in February. However his sister had called back in 2 days and stated he was not tolerating it well. I had immediately started him on Zyprexa. Please check the telephone log to confirm the above information. I also made a note that patient needs to return to samples of Seroquel. Per his chart he was not stopped on any medications.

## 2017-04-06 ENCOUNTER — Encounter: Payer: Self-pay | Admitting: Psychiatry

## 2017-04-06 ENCOUNTER — Ambulatory Visit (INDEPENDENT_AMBULATORY_CARE_PROVIDER_SITE_OTHER): Payer: Medicare HMO | Admitting: Psychiatry

## 2017-04-06 VITALS — BP 83/51 | HR 93 | Temp 97.5°F | Wt 241.4 lb

## 2017-04-06 DIAGNOSIS — F200 Paranoid schizophrenia: Secondary | ICD-10-CM

## 2017-04-06 MED ORDER — OLANZAPINE 15 MG PO TABS: 30.0000 mg | ORAL_TABLET | Freq: Every day | ORAL | 0 refills | Status: DC

## 2017-04-06 MED ORDER — PAROXETINE HCL 30 MG PO TABS: 15.0000 mg | ORAL_TABLET | Freq: Every day | ORAL | 1 refills | Status: DC

## 2017-04-06 MED ORDER — RISPERIDONE 4 MG PO TABS: 4.0000 mg | ORAL_TABLET | Freq: Every day | ORAL | 1 refills | Status: DC

## 2017-04-06 NOTE — Progress Notes (Signed)
Patient ID: [name], male   DOB: 06-17-1951, 66 y.o.   MRN: [id number]  [facility] MD/PA/NP OP Progress Note  04/06/2017 10:03 AM [name]  MRN:  [id number]  Subjective:  Patient is a 66 year old white male with a history of schizophrenia.  Patient is presents today with his sister for a follow-up appointment.  Patient`s sister today reports that the patient is having some trouble with his mood and she would like him to restart his propranolol and Klonopin. I discussed with the patient's sister that they had stopped the propranolol before this clinician started seeing him in February 2017. She was also told that this clinician does not prescribe Klonopin and does not see any indication at this time. Patient reports that he is sleeping well and eating well. He denies any auditory or visual hallucinations. He continues to talk with the lisp and probably due to akathisia and some dystonia from taking 2 antipsychotic medications for such a long time. He does report having some sexual urges but has not acted on it. He does say that sometimes he finds it difficult to be around people. Overall doing well Per patient.  Patient's sister during the last visit wanted him to try a different medication because of the high co-pay on the Zyprexa. This clinician had given samples of Seroquel to be titrated and have a trial on that. Per nursing staff today patient had refused to take the samples saying Seroquel had too many side effects. 2 days after this she had called wanting him back on the Zyprexa which was prescribed again. Chief Complaint:  Chief Complaint    Follow-up; Anxiety; Medication Problem; Other     Doing well Visit Diagnosis:     ICD-9-CM ICD-10-CM   1. Paranoid schizophrenia ([place]) 295.30 F20.0     Past Medical History:  Past Medical History:  Diagnosis Date  . Chicken pox   . Dizziness   . Measles   . Mumps   . Schizophrenia [facility])     Past Surgical History:  Procedure Laterality  Date  . ABDOMINAL SURGERY    . COLON SURGERY    . LAPAROTOMY N/A 03/20/2016   Procedure: EXPLORATORY LAPAROTOMY with lysis of adhesions;  Surgeon: Jules Husbands, MD;  Location: [facility] ORS;  Service: General;  Laterality: N/A;  . None     Family History:  Family History  Problem Relation Age of Onset  . Diabetes Mother   . Heart disease Mother   . Stroke Mother   . Diabetes Father   . Hypertension Father   . Lung cancer Father   . Cancer Father     lung cancer  . Schizophrenia Sister   . Lung cancer Sister   . Colon cancer Sister    Social History:  Social History   Social History  . Marital status: Single    Spouse name: N/A  . Number of children: 2  . Years of education: N/A   Occupational History  . Disabled    Social History Main Topics  . Smoking status: Former Smoker    Types: Cigarettes    Quit date: 08/04/2004  . Smokeless tobacco: Former Systems developer    Quit date: 08/04/2004  . Alcohol use No  . Drug use: No  . Sexual activity: Not Currently   Other Topics Concern  . None   Social History Narrative  . None   Additional History:   Assessment:   Musculoskeletal: Strength & Muscle Tone: within normal limits Gait &  Station: normal Patient leans: N/A  Psychiatric Specialty Exam: Medication Refill   Anxiety  Patient reports no insomnia, nervous/anxious behavior or suicidal ideas.      Review of Systems  Psychiatric/Behavioral: Negative for depression, hallucinations, memory loss, substance abuse and suicidal ideas. The patient is not nervous/anxious and does not have insomnia.   All other systems reviewed and are negative.   Blood pressure (!) 83/51, pulse 93, temperature 97.5 F (36.4 C), temperature source Oral, weight 241 lb 6.4 oz (109.5 kg).Body mass index is 32.74 kg/m.  General Appearance: Neat and Well Groomed  Eye Contact:  Good  Speech:  Normal Rate  Volume:  Normal  Mood:  Good   Affect:  Appropriate and Congruent  Thought Process:   Linear and at times concrete  Orientation:  Full (Time, Place, and Person)  Thought Content:  Negative  Suicidal Thoughts:  No  Homicidal Thoughts:  No  Memory:  Immediate;   Good Recent;   Good Remote;   Good  Judgement:  Good  Insight:  Good  Psychomotor Activity:  Negative  Concentration:  Good  Recall:  Good  Fund of Knowledge: Fair  Language: Fair  Akathisia:  Negative  Handed:  Right unknown   AIMS (if indicated):  , normal, no rigidity or cog wheeling  Assets:  Desire for Improvement Social Support  ADL's:  Intact  Cognition: WNL  Sleep:  good   Is the patient at risk to self?  No. Has the patient been a risk to self in the past 6 months?  No. Has the patient been a risk to self within the distant past?  No. Is the patient a risk to others?  No. Has the patient been a risk to others in the past 6 months?  No. Has the patient been a risk to others within the distant past?  No.  Current Medications: Current Outpatient Prescriptions  Medication Sig Dispense Refill  . alum & mag hydroxide-simeth (MAALOX/MYLANTA) 200-200-20 MG/5ML suspension Take 30 mLs by mouth every 6 (six) hours as needed for indigestion or heartburn.    Marland Kitchen OLANZapine (ZYPREXA) 15 MG tablet Take 2 tablets (30 mg total) by mouth at bedtime. 180 tablet 0  . PARoxetine (PAXIL) 30 MG tablet Take 0.5 tablets (15 mg total) by mouth at bedtime. 45 tablet 1  . polyethylene glycol powder (GLYCOLAX/MIRALAX) powder Take 17 g by mouth daily. 850 g 12  . risperidone (RISPERDAL) 4 MG tablet Take 1 tablet (4 mg total) by mouth at bedtime. 90 tablet 1   No current facility-administered medications for this visit.     Medical Decision Making:  Established Problem, Stable/Improving (1), Review of Medication Regimen & Side Effects (2) and Review of New Medication or Change in Dosage (2)  Treatment Plan Summary:Medication management and Plan    Schizophrenia-stable. Continue Zyprexa at 30 mg at bedtime Continue  risperidone at 4 mg, at bedtime, paroxetine 15 mg daily,   Patient and his sister were educated about the incidence of side effects and long-term movement disorders being high with combination of Zyprexa and Risperdal. Also discussed metabolic disturbances with the combination of these medications. However they stated that patient has needed this combination to be able to function.  Given the confusion patient's sister adds to his treatment and her wanting patient to be on Klonopin, we discussed that patient may benefit from seeing another doctor. Patient and his sister are okay with this. They will transfer his care to physician at [facility]. I discussed  that I will give him 3 months supply of his current medications.  Patient will follow up with a different provider. We will be available for any emergency purposes during the time of this transition.  Channell Quattrone 04/06/2017, 10:03 AM

## 2017-04-08 ENCOUNTER — Encounter: Payer: Self-pay | Admitting: Family Medicine

## 2017-04-08 ENCOUNTER — Ambulatory Visit (INDEPENDENT_AMBULATORY_CARE_PROVIDER_SITE_OTHER): Payer: Medicare HMO | Admitting: Family Medicine

## 2017-04-08 VITALS — BP 84/50 | HR 82 | Temp 97.7°F | Resp 16 | Wt 245.0 lb

## 2017-04-08 DIAGNOSIS — R42 Dizziness and giddiness: Secondary | ICD-10-CM

## 2017-04-08 DIAGNOSIS — I950 Idiopathic hypotension: Secondary | ICD-10-CM

## 2017-04-08 DIAGNOSIS — D649 Anemia, unspecified: Secondary | ICD-10-CM | POA: Diagnosis not present

## 2017-04-08 DIAGNOSIS — F2089 Other schizophrenia: Secondary | ICD-10-CM

## 2017-04-08 MED ORDER — FLUDROCORTISONE ACETATE 0.1 MG PO TABS: 100.0000 ug | ORAL_TABLET | Freq: Every day | ORAL | 0 refills | Status: DC

## 2017-04-08 NOTE — Progress Notes (Signed)
Patient: [name] Male    DOB: 10/07/1951   66 y.o.   MRN: [id number] Visit Date: 04/08/2017  Today's Provider: Lelon Huh, MD   Chief Complaint  Patient presents with  . Hypotension   Subjective:    Patient has been having low pressure for about a year. Patient has been having light headedness and dizziness. Home blood pressure readings have been around 90/50.   Patient had been taking fludrocortisone and midodrine for hypotension as of last visit on 02/16/2017. His sister is with him today and states she 'weaned him off' of those medications about a month ago. She states was already having some mild dizziness before medication was stopped, but it has gotten worse since. He also has history of normocytic anemia.   Lab Results  Component Value Date   WBC 8.6 05/19/2016   HGB 10.1 (L) 05/19/2016   HCT 31.5 (L) 05/19/2016   MCV 81.4 05/19/2016   PLT 302 05/19/2016      No Known Allergies   Current Outpatient Prescriptions:  .  alum & mag hydroxide-simeth (MAALOX/MYLANTA) 200-200-20 MG/5ML suspension, Take 30 mLs by mouth every 6 (six) hours as needed for indigestion or heartburn., Disp: , Rfl:  .  OLANZapine (ZYPREXA) 15 MG tablet, Take 2 tablets (30 mg total) by mouth at bedtime., Disp: 180 tablet, Rfl: 0 .  PARoxetine (PAXIL) 30 MG tablet, Take 0.5 tablets (15 mg total) by mouth at bedtime., Disp: 45 tablet, Rfl: 1 .  polyethylene glycol powder (GLYCOLAX/MIRALAX) powder, Take 17 g by mouth daily., Disp: 850 g, Rfl: 12 .  risperidone (RISPERDAL) 4 MG tablet, Take 1 tablet (4 mg total) by mouth at bedtime., Disp: 90 tablet, Rfl: 1  Review of Systems  Constitutional: Negative for appetite change, chills and fever.  Respiratory: Negative for chest tightness, shortness of breath and wheezing.   Cardiovascular: Negative for chest pain and palpitations.  Gastrointestinal: Positive for constipation. Negative for abdominal pain, nausea and vomiting.  Neurological:  Positive for dizziness and light-headedness.  Psychiatric/Behavioral: The patient is nervous/anxious.     Social History  Substance Use Topics  . Smoking status: Former Smoker    Types: Cigarettes    Quit date: 08/04/2004  . Smokeless tobacco: Former Systems developer    Quit date: 08/04/2004  . Alcohol use No   Objective:   BP (!) 84/50 (BP Location: Right Arm, Patient Position: Sitting, Cuff Size: Large)   Pulse 82   Temp 97.7 F (36.5 C) (Oral)   Resp 16   Wt 245 lb (111.1 kg)   SpO2 98%   BMI 33.23 kg/m     Physical Exam      Assessment & Plan:     1. Dizziness Likely exacerbated by drop in BP since stopping mineral corticoids. He still has these medications at home and will restart. He also has history of mild anemia and well check to make sure this has not worsened.  - Comprehensive metabolic panel   2. Idiopathic hypotension Suspect secondary to his antipsychotics.   3. Anemia, unspecified type  - CBC   4. Schizophrenia, simple, chronic ([place]) Patients psychiatrist apparently discontinue his clonazepam several months ago, so his sister decided to transfer him to another psychiatrist. Is scheduled to see new psychiatrist in June. She asked me to write prescription for him until June. I advised them they the decision to restart this would need to be made between the patient and his psychiatrist.  Lelon Huh, MD  [facility]

## 2017-04-09 ENCOUNTER — Encounter: Payer: Self-pay | Admitting: Family Medicine

## 2017-04-09 LAB — CBC
HEMATOCRIT: 39.3 % (ref 37.5–51.0)
Hemoglobin: 13.2 g/dL (ref 13.0–17.7)
MCH: 28.8 pg (ref 26.6–33.0)
MCHC: 33.6 g/dL (ref 31.5–35.7)
MCV: 86 fL (ref 79–97)
Platelets: 227 10*3/uL (ref 150–379)
RBC: 4.58 x10E6/uL (ref 4.14–5.80)
RDW: 14.4 % (ref 12.3–15.4)
WBC: 8.4 10*3/uL (ref 3.4–10.8)

## 2017-04-09 LAB — COMPREHENSIVE METABOLIC PANEL
ALK PHOS: 98 IU/L (ref 39–117)
ALT: 15 IU/L (ref 0–44)
AST: 18 IU/L (ref 0–40)
Albumin/Globulin Ratio: 1.5 (ref 1.2–2.2)
Albumin: 4 g/dL (ref 3.6–4.8)
BILIRUBIN TOTAL: 0.2 mg/dL (ref 0.0–1.2)
BUN / CREAT RATIO: 10 (ref 10–24)
BUN: 19 mg/dL (ref 8–27)
CHLORIDE: 100 mmol/L (ref 96–106)
CO2: 25 mmol/L (ref 18–29)
Calcium: 9.4 mg/dL (ref 8.6–10.2)
Creatinine, Ser: 1.82 mg/dL — ABNORMAL HIGH (ref 0.76–1.27)
GFR calc Af Amer: 43 mL/min/{1.73_m2} — ABNORMAL LOW (ref >59)
GFR calc non Af Amer: 38 mL/min/{1.73_m2} — ABNORMAL LOW (ref >59)
GLUCOSE: 83 mg/dL (ref 65–99)
Globulin, Total: 2.7 g/dL (ref 1.5–4.5)
Potassium: 4.9 mmol/L (ref 3.5–5.2)
Sodium: 139 mmol/L (ref 134–144)
Total Protein: 6.7 g/dL (ref 6.0–8.5)

## 2017-04-20 ENCOUNTER — Telehealth: Payer: Self-pay | Admitting: Family Medicine

## 2017-04-20 NOTE — Telephone Encounter (Signed)
Pt 's sister said they have not gotten his lab results back yet from a couple of weeks ago.  Her call back Is [phone]  Thanks teri

## 2017-04-20 NOTE — Telephone Encounter (Signed)
Notified [name] that we did discuss pt's lab results on 04/11/17. Gave results to [facility] again.

## 2017-04-21 ENCOUNTER — Ambulatory Visit: Payer: Medicare HMO | Admitting: Psychiatry

## 2017-05-06 ENCOUNTER — Ambulatory Visit: Payer: Self-pay | Admitting: Family Medicine

## 2017-05-06 ENCOUNTER — Telehealth: Payer: Self-pay

## 2017-05-06 MED ORDER — PERMETHRIN 5 % EX CREA: 1.0000 "application " | TOPICAL_CREAM | Freq: Once | CUTANEOUS | 0 refills | Status: AC

## 2017-05-06 NOTE — Telephone Encounter (Signed)
Patient states his nephew was diagnosed with scabies by dermatologist this morning. Yolanda Bonine has been to ER and his doctor and was been treated for poison ivy but finally got diagnosed with scabies and that doctor said everybody in the household needs to be treated for this. Patient lives with nephew. Please review,. He has appointment today but wanted to see if he could be treated for this over the phone. -aa

## 2017-05-27 DIAGNOSIS — F200 Paranoid schizophrenia: Secondary | ICD-10-CM | POA: Diagnosis not present

## 2017-06-24 ENCOUNTER — Other Ambulatory Visit: Payer: Self-pay | Admitting: Family Medicine

## 2017-06-24 DIAGNOSIS — F200 Paranoid schizophrenia: Secondary | ICD-10-CM | POA: Diagnosis not present

## 2017-07-01 ENCOUNTER — Telehealth: Payer: Self-pay | Admitting: Family Medicine

## 2017-07-01 MED ORDER — FLUDROCORTISONE ACETATE 0.1 MG PO TABS: 100.0000 ug | ORAL_TABLET | Freq: Two times a day (BID) | ORAL | 0 refills | Status: DC

## 2017-07-01 NOTE — Telephone Encounter (Signed)
Pt sister informed and med sent in.

## 2017-07-01 NOTE — Telephone Encounter (Signed)
Pt's sister called reporting that her brothers blood pressure will not stay up.  She said even with the medication his blood pressure is low.  Yesterday morning it was 77/50.  She said is was acting ok.  Please advise  [phone]  Thanks teri

## 2017-07-01 NOTE — Telephone Encounter (Signed)
Per sister [name], pt is taking 2 medications to improve hypotension. One is fludrocortisone, and she is unsure of the name of the other medication. She is requesting a change in these medications, because they are ineffective. She is making a "homemade salty drink", which is helping improve BP. He is not experiencing dizziness, lightheadedness, syncope as long as he drinks the salty drink. Nor is he experiencing chest pain or SOB. Please advise.

## 2017-07-01 NOTE — Telephone Encounter (Signed)
Increase fludrocortisone to 2 tablets daily. Schedule follow up in 10-14 days.

## 2017-07-14 ENCOUNTER — Encounter: Payer: Self-pay | Admitting: Family Medicine

## 2017-07-14 ENCOUNTER — Ambulatory Visit (INDEPENDENT_AMBULATORY_CARE_PROVIDER_SITE_OTHER): Payer: Medicare HMO | Admitting: Family Medicine

## 2017-07-14 VITALS — BP 120/80 | HR 76 | Temp 97.8°F | Resp 16 | Wt 251.0 lb

## 2017-07-14 DIAGNOSIS — I950 Idiopathic hypotension: Secondary | ICD-10-CM

## 2017-07-14 DIAGNOSIS — H6123 Impacted cerumen, bilateral: Secondary | ICD-10-CM | POA: Diagnosis not present

## 2017-07-14 MED ORDER — FLUDROCORTISONE ACETATE 0.1 MG PO TABS: 100.0000 ug | ORAL_TABLET | Freq: Two times a day (BID) | ORAL | 2 refills | Status: DC

## 2017-07-14 NOTE — Progress Notes (Signed)
       Patient: [name] Male    DOB: 1951-10-07   66 y.o.   MRN: [id number] Visit Date: 07/14/2017  Today's Provider: Lelon Huh, MD   Chief Complaint  Patient presents with  . Ear Problem  . Hypotension   Subjective:    HPI   Pt is c/o right ear congestion since yesterday. Per sister, he has "a habit of pushing the ear wax into the ear with his finger".   Also here for follow up hyPOtension on midodrine and recently double dose of fludrocortisone. His sister startes his BP continues to stay low even with increased dosage. She has been making "salty water" drink for him to drink every day, or else his BP can be as low as the 70's/50's. He drank his salty water this morning, and his BP is 120/80. He feels dizzy when his BP is low, but after the drinking the salty water, dizziness has improved and he is not falling.   No Known Allergies   Current Outpatient Prescriptions:  .  fludrocortisone (FLORINEF) 0.1 MG tablet, Take 1 tablet (100 mcg total) by mouth 2 (two) times daily., Disp: 60 tablet, Rfl: 0 .  midodrine (PROAMATINE) 10 MG tablet, TAKE ONE TABLET BY MOUTH THREE TIMES A DAY. WITH MEALS, Disp: 90 tablet, Rfl: 3 .  OLANZapine (ZYPREXA) 20 MG tablet, , Disp: , Rfl:  .  PARoxetine (PAXIL) 10 MG tablet, Take 10 mg by mouth daily., Disp: , Rfl:  .  polyethylene glycol powder (GLYCOLAX/MIRALAX) powder, Take 17 g by mouth daily., Disp: 850 g, Rfl: 12 .  risperidone (RISPERDAL) 4 MG tablet, Take 1 tablet (4 mg total) by mouth at bedtime., Disp: 90 tablet, Rfl: 1  Review of Systems  Constitutional: Positive for activity change (decreased activity) and unexpected weight change (gain). Negative for appetite change, chills, diaphoresis, fatigue and fever.  HENT: Positive for hearing loss (right ear). Negative for ear pain.   Respiratory: Negative for shortness of breath.   Cardiovascular: Negative for chest pain, palpitations and leg swelling.  Neurological: Negative for  dizziness.    Social History  Substance Use Topics  . Smoking status: Former Smoker    Types: Cigarettes    Quit date: 08/04/2004  . Smokeless tobacco: Former Systems developer    Quit date: 08/04/2004  . Alcohol use No   Objective:   BP 120/80 (BP Location: Left Arm, Patient Position: Sitting, Cuff Size: Large)   Pulse 76   Temp 97.8 F (36.6 C) (Oral)   Resp 16   Wt 251 lb (113.9 kg)   BMI 34.04 kg/m  Vitals:   07/14/17 1041  BP: 120/80  Pulse: 76  Resp: 16  Temp: 97.8 F (36.6 C)  TempSrc: Oral  Weight: 251 lb (113.9 kg)     Physical Exam  General Appearance:    Alert, cooperative, no distress  HENT:   Both ear canals obstructed by cerumen.           Assessment & Plan:     1. Bilateral impacted cerumen After soaking with Debrox, ear canal was irrigated with water until clear. Patient tolerated procedure well.    2. Idiopathic hypotension His sister reports symptoms and BP greatly improved with salt water drinks. She wean gradually reduce dose of fludrocortisone as able to keep SBP 100-110 .        Lelon Huh, MD  [facility]

## 2017-07-15 ENCOUNTER — Ambulatory Visit: Payer: Self-pay | Admitting: Family Medicine

## 2017-07-18 ENCOUNTER — Ambulatory Visit: Payer: Self-pay | Admitting: Family Medicine

## 2017-07-22 ENCOUNTER — Other Ambulatory Visit: Payer: Self-pay | Admitting: Family Medicine

## 2017-08-27 ENCOUNTER — Emergency Department
Admission: EM | Admit: 2017-08-27 | Discharge: 2017-08-27 | Disposition: A | Discharge status: Home / Self Care | Payer: Medicare HMO | Attending: Student in an Organized Health Care Education/Training Program | Admitting: Student in an Organized Health Care Education/Training Program

## 2017-08-27 ENCOUNTER — Emergency Department: Payer: Medicare HMO

## 2017-08-27 ENCOUNTER — Encounter: Payer: Self-pay | Admitting: Emergency Medicine

## 2017-08-27 DIAGNOSIS — Z87891 Personal history of nicotine dependence: Secondary | ICD-10-CM | POA: Diagnosis not present

## 2017-08-27 DIAGNOSIS — R109 Unspecified abdominal pain: Secondary | ICD-10-CM | POA: Insufficient documentation

## 2017-08-27 DIAGNOSIS — N183 Chronic kidney disease, stage 3 (moderate): Secondary | ICD-10-CM | POA: Diagnosis not present

## 2017-08-27 DIAGNOSIS — Z79899 Other long term (current) drug therapy: Secondary | ICD-10-CM | POA: Diagnosis not present

## 2017-08-27 DIAGNOSIS — R102 Pelvic and perineal pain: Secondary | ICD-10-CM | POA: Diagnosis not present

## 2017-08-27 LAB — CBC
HCT: 40.3 % (ref 40.0–52.0)
HEMOGLOBIN: 13.6 g/dL (ref 13.0–18.0)
MCH: 29.2 pg (ref 26.0–34.0)
MCHC: 33.8 g/dL (ref 32.0–36.0)
MCV: 86.5 fL (ref 80.0–100.0)
Platelets: 211 10*3/uL (ref 150–440)
RBC: 4.66 MIL/uL (ref 4.40–5.90)
RDW: 14.1 % (ref 11.5–14.5)
WBC: 8.9 10*3/uL (ref 3.8–10.6)

## 2017-08-27 LAB — COMPREHENSIVE METABOLIC PANEL
ALBUMIN: 4 g/dL (ref 3.5–5.0)
ALT: 15 U/L — ABNORMAL LOW (ref 17–63)
ANION GAP: 6 (ref 5–15)
AST: 19 U/L (ref 15–41)
Alkaline Phosphatase: 85 U/L (ref 38–126)
BILIRUBIN TOTAL: 0.6 mg/dL (ref 0.3–1.2)
BUN: 16 mg/dL (ref 6–20)
CALCIUM: 9.1 mg/dL (ref 8.9–10.3)
CO2: 29 mmol/L (ref 22–32)
CREATININE: 1.64 mg/dL — AB (ref 0.61–1.24)
Chloride: 105 mmol/L (ref 101–111)
GFR calc Af Amer: 48 mL/min — ABNORMAL LOW (ref >60)
GFR calc non Af Amer: 41 mL/min — ABNORMAL LOW (ref >60)
GLUCOSE: 84 mg/dL (ref 65–99)
Potassium: 4 mmol/L (ref 3.5–5.1)
SODIUM: 140 mmol/L (ref 135–145)
Total Protein: 7.2 g/dL (ref 6.5–8.1)

## 2017-08-27 LAB — URINALYSIS, COMPLETE (UACMP) WITH MICROSCOPIC
Bacteria, UA: NONE SEEN
Bilirubin Urine: NEGATIVE
GLUCOSE, UA: NEGATIVE mg/dL
Hgb urine dipstick: NEGATIVE
Ketones, ur: NEGATIVE mg/dL
Leukocytes, UA: NEGATIVE
Nitrite: NEGATIVE
PROTEIN: NEGATIVE mg/dL
SQUAMOUS EPITHELIAL / LPF: NONE SEEN
Specific Gravity, Urine: 1.008 (ref 1.005–1.030)
pH: 7 (ref 5.0–8.0)

## 2017-08-27 LAB — LIPASE, BLOOD: LIPASE: 18 U/L (ref 11–51)

## 2017-08-27 MED ORDER — TRAMADOL HCL 50 MG PO TABS: 50.0000 mg | ORAL_TABLET | Freq: Four times a day (QID) | ORAL | 0 refills | Status: DC | PRN

## 2017-08-27 MED ORDER — LIDOCAINE 5 % EX PTCH: 1.0000 | MEDICATED_PATCH | Freq: Two times a day (BID) | CUTANEOUS | 0 refills | Status: DC

## 2017-08-27 MED ORDER — ACETAMINOPHEN 325 MG PO TABS
650.0000 mg | ORAL_TABLET | Freq: Once | ORAL | Status: AC
  Administered 2017-08-27: 650 mg via ORAL
  Filled 2017-08-27: qty 2

## 2017-08-27 NOTE — Discharge Instructions (Signed)

## 2017-08-27 NOTE — ED Triage Notes (Signed)
States R side pain began today.Denies dysuria. History gallstones per sister.

## 2017-08-27 NOTE — ED Provider Notes (Signed)
[facility] Emergency Department Provider Note    First MD Initiated Contact with Patient 08/27/17 1620     (approximate)  I have reviewed the triage vital signs and the nursing notes.   HISTORY  Chief Complaint Flank Pain    HPI [name] is a 66 y.o. male sided pain and flank pain. Denies any fevers. No nausea or vomiting. Points to his right anterior superior iliac spine when asking about where it hurts. No recent falls. No numbness or tingling. No dysuria or hematuria. No nausea or vomiting. Does have a history of gallstones but has not had any right upper quadrant pain or epigastric pain. No pain radiating through to his back.   Past Medical History:  Diagnosis Date  . Chicken pox   . Dizziness   . Measles   . Mumps   . Schizophrenia ([place])    Family History  Problem Relation Age of Onset  . Diabetes Mother   . Heart disease Mother   . Stroke Mother   . Diabetes Father   . Hypertension Father   . Lung cancer Father   . Cancer Father        lung cancer  . Schizophrenia Sister   . Lung cancer Sister   . Colon cancer Sister    Past Surgical History:  Procedure Laterality Date  . ABDOMINAL SURGERY    . COLON SURGERY    . LAPAROTOMY N/A 03/20/2016   Procedure: EXPLORATORY LAPAROTOMY with lysis of adhesions;  Surgeon: Jules Husbands, MD;  Location: [facility] ORS;  Service: General;  Laterality: N/A;  . None     Patient Active Problem List   Diagnosis Date Noted  . Cholecystitis 05/18/2016  . Chronic nausea 05/17/2016  . Hypoalbuminemia 05/14/2016  . Anemia 05/11/2016  . Hypotension 05/04/2016  . SBO (small bowel obstruction) ([place]) 03/18/2016  . Dizziness 05/16/2015  . Edema 05/15/2015  . Venous stasis dermatitis 05/15/2015  . History of adenomatous polyp of colon 12/01/2009  . Schizophrenia, simple, chronic ([place]) 08/21/2009  . CKD (chronic kidney disease) stage 3, GFR 30-59 ml/min 08/20/2009  . Hyperlipemia 08/19/2009       Prior to Admission medications   Medication Sig Start Date End Date Taking? Authorizing Provider  fludrocortisone (FLORINEF) 0.1 MG tablet Take 1 tablet (100 mcg total) by mouth 2 (two) times daily. 07/14/17   Birdie Sons, MD  lidocaine (LIDODERM) 5 % Place 1 patch onto the skin every 12 (twelve) hours. Remove & Discard patch within 12 hours or as directed by MD 08/27/17 08/27/18  Merlyn Lot, MD  midodrine (PROAMATINE) 10 MG tablet TAKE ONE TABLET BY MOUTH THREE TIMES A DAY. WITH MEALS 06/24/17   Birdie Sons, MD  OLANZapine (ZYPREXA) 20 MG tablet  06/24/17   [provider]  PARoxetine (PAXIL) 10 MG tablet Take 10 mg by mouth daily.    [provider]  polyethylene glycol powder (GLYCOLAX/MIRALAX) powder Take 17 g by mouth daily. 01/28/17   Birdie Sons, MD  pravastatin (PRAVACHOL) 40 MG tablet TAKE 1 TABLET BY MOUTH EVERY DAY 07/22/17   Birdie Sons, MD  risperidone (RISPERDAL) 4 MG tablet Take 1 tablet (4 mg total) by mouth at bedtime. 04/06/17   Elvin So, MD  traMADol (ULTRAM) 50 MG tablet Take 1 tablet (50 mg total) by mouth every 6 (six) hours as needed. 08/27/17 08/27/18  Merlyn Lot, MD    Allergies Patient has no known allergies.  Social History Social History  Substance Use Topics  . Smoking status: Former Smoker    Types: Cigarettes    Quit date: 08/04/2004  . Smokeless tobacco: Former Systems developer    Quit date: 08/04/2004  . Alcohol use No    Review of Systems Patient denies headaches, rhinorrhea, blurry vision, numbness, shortness of breath, chest pain, edema, cough, abdominal pain, nausea, vomiting, diarrhea, dysuria, fevers, rashes or hallucinations unless otherwise stated above in HPI. ____________________________________________   PHYSICAL EXAM:  VITAL SIGNS: Vitals:   08/27/17 1421 08/27/17 1629  BP: (!) 145/85 (!) 169/93  Pulse: 69 61  Resp: 20 18  Temp: 97.9 F (36.6 C)   SpO2: 99% 99%    Constitutional:  Alert and oriented. Well appearing and in no acute distress. Eyes: Conjunctivae are normal.  Head: Atraumatic. Nose: No congestion/rhinnorhea. Mouth/Throat: Mucous membranes are moist.   Neck: No stridor. Painless ROM.  Cardiovascular: Normal rate, regular rhythm. Grossly normal heart sounds.  Good peripheral circulation. Respiratory: Normal respiratory effort.  No retractions. Lungs CTAB. Gastrointestinal: Soft and nontender. No distention. No abdominal bruits. No CVA tenderness. Musculoskeletal: No lower extremity tenderness nor edema.  No joint effusions. Neurologic:  Normal speech and language. No gross focal neurologic deficits are appreciated. No facial droop Skin:  Skin is warm, dry and intact. No rash noted. Psychiatric: Mood and affect are normal. Speech and behavior are normal.  ____________________________________________   LABS (all labs ordered are listed, but only abnormal results are displayed)  Results for orders placed or performed during the hospital encounter of 08/27/17 (from the past 24 hour(s))  Comprehensive metabolic panel     Status: Abnormal   Collection Time: 08/27/17  2:23 PM  Result Value Ref Range   Sodium 140 135 - 145 mmol/L   Potassium 4.0 3.5 - 5.1 mmol/L   Chloride 105 101 - 111 mmol/L   CO2 29 22 - 32 mmol/L   Glucose, Bld 84 65 - 99 mg/dL   BUN 16 6 - 20 mg/dL   Creatinine, Ser 1.64 (H) 0.61 - 1.24 mg/dL   Calcium 9.1 8.9 - 10.3 mg/dL   Total Protein 7.2 6.5 - 8.1 g/dL   Albumin 4.0 3.5 - 5.0 g/dL   AST 19 15 - 41 U/L   ALT 15 (L) 17 - 63 U/L   Alkaline Phosphatase 85 38 - 126 U/L   Total Bilirubin 0.6 0.3 - 1.2 mg/dL   GFR calc non Af Amer 41 (L) >60 mL/min   GFR calc Af Amer 48 (L) >60 mL/min   Anion gap 6 5 - 15  CBC     Status: None   Collection Time: 08/27/17  2:23 PM  Result Value Ref Range   WBC 8.9 3.8 - 10.6 K/uL   RBC 4.66 4.40 - 5.90 MIL/uL   Hemoglobin 13.6 13.0 - 18.0 g/dL   HCT 40.3 40.0 - 52.0 %   MCV 86.5 80.0 - 100.0  fL   MCH 29.2 26.0 - 34.0 pg   MCHC 33.8 32.0 - 36.0 g/dL   RDW 14.1 11.5 - 14.5 %   Platelets 211 150 - 440 K/uL  Lipase, blood     Status: None   Collection Time: 08/27/17  2:23 PM  Result Value Ref Range   Lipase 18 11 - 51 U/L  Urinalysis, Complete w Microscopic     Status: Abnormal   Collection Time: 08/27/17  2:24 PM  Result Value Ref Range   Color, Urine YELLOW (A) YELLOW  APPearance CLEAR (A) CLEAR   Specific Gravity, Urine 1.008 1.005 - 1.030   pH 7.0 5.0 - 8.0   Glucose, UA NEGATIVE NEGATIVE mg/dL   Hgb urine dipstick NEGATIVE NEGATIVE   Bilirubin Urine NEGATIVE NEGATIVE   Ketones, ur NEGATIVE NEGATIVE mg/dL   Protein, ur NEGATIVE NEGATIVE mg/dL   Nitrite NEGATIVE NEGATIVE   Leukocytes, UA NEGATIVE NEGATIVE   RBC / HPF 0-5 0 - 5 RBC/hpf   WBC, UA 0-5 0 - 5 WBC/hpf   Bacteria, UA NONE SEEN NONE SEEN   Squamous Epithelial / LPF NONE SEEN NONE SEEN   ____________________________________________  ____________________________________________  RADIOLOGY  I personally reviewed all radiographic images ordered to evaluate for the above acute complaints and reviewed radiology reports and findings.  These findings were personally discussed with the patient.  Please see medical record for radiology report.  ____________________________________________   PROCEDURES  Procedure(s) performed:  Procedures    Critical Care performed: no ____________________________________________   INITIAL IMPRESSION / ASSESSMENT AND PLAN / ED COURSE  Pertinent labs & imaging results that were available during my care of the patient were reviewed by me and considered in my medical decision making (see chart for details).  DDX: stone, msk strain, fracture, pyelo, cholelithiasis  [name] is a 67 y.o. who presents to the ED with right pain lateral to the ASIS. Denies trauma or injury. Afebrile in ED. Exam as above. abdominal exam is benign. No peritoneal signs.  Not consistent  with appendicitis, diverticulitis. Possible kidney stone, cystitis, or pyelonephritis.   Clinical Course as of Aug 27 1712  Sat Aug 27, 2017  1708 CT imaging with evidence of stone. Blood work otherwise reassuring. His abdominal exam is soft and benign. Due to this more likely some component of muscular skeletal pain. We will start on oral and topical medications with follow-up with PCP.  [PR]    Clinical Course User Index [PR] Merlyn Lot, MD     ____________________________________________   FINAL CLINICAL IMPRESSION(S) / ED DIAGNOSES  Final diagnoses:  Right flank pain      NEW MEDICATIONS STARTED DURING THIS VISIT:  New Prescriptions   LIDOCAINE (LIDODERM) 5 %    Place 1 patch onto the skin every 12 (twelve) hours. Remove & Discard patch within 12 hours or as directed by MD   TRAMADOL (ULTRAM) 50 MG TABLET    Take 1 tablet (50 mg total) by mouth every 6 (six) hours as needed.     Note:  This document was prepared using Dragon voice recognition software and may include unintentional dictation errors.    Merlyn Lot, MD 08/27/17 [phone]

## 2017-08-29 ENCOUNTER — Encounter: Payer: Self-pay | Admitting: Family Medicine

## 2017-08-29 ENCOUNTER — Ambulatory Visit (INDEPENDENT_AMBULATORY_CARE_PROVIDER_SITE_OTHER): Payer: Medicare HMO | Admitting: Family Medicine

## 2017-08-29 ENCOUNTER — Telehealth: Payer: Self-pay | Admitting: Family Medicine

## 2017-08-29 VITALS — BP 90/50 | HR 83 | Temp 97.7°F | Resp 16 | Wt 248.0 lb

## 2017-08-29 DIAGNOSIS — R109 Unspecified abdominal pain: Secondary | ICD-10-CM

## 2017-08-29 LAB — POCT URINALYSIS DIPSTICK
Bilirubin, UA: NEGATIVE
Blood, UA: NEGATIVE
GLUCOSE UA: NEGATIVE
Ketones, UA: POSITIVE
Leukocytes, UA: NEGATIVE
NITRITE UA: NEGATIVE
Protein, UA: POSITIVE
UROBILINOGEN UA: 0.2 U/dL
pH, UA: 6 (ref 5.0–8.0)

## 2017-08-29 MED ORDER — TRAMADOL-ACETAMINOPHEN 37.5-325 MG PO TABS
2.0000 | ORAL_TABLET | Freq: Three times a day (TID) | ORAL | 0 refills | Status: DC | PRN
Start: 2017-08-29 — End: 2018-02-23

## 2017-08-29 NOTE — Progress Notes (Signed)
Patient: [name] Male    DOB: 10-15-1951   66 y.o.   MRN: [id number] Visit Date: 08/29/2017  Today's Provider: Lelon Huh, MD   Chief Complaint  Patient presents with  . Hospitalization Follow-up  . Flank Pain   Subjective:      Follow Up ER Visit  Patient is here for ER follow up.  He was recently seen at [facility] for right flank pain on 08/27/2017. Treatment for this included;CT imaging with evidence of stone. Blood work otherwise reassuring. His abdominal exam is soft and benign. Was thought likely to be  some component of muscular skeletal pain. Was prescribed tramadol which hasn't helped Patient prescribed lidocaine patch (which did not get due to cost) He reports good compliance with treatment. He reports this condition is Unchanged. He states tramadol hasn't really helped at all Patient's sister states that she is sure that his pain is from his gallbladder. However patient states pain is not related to eating. He had normal liver functions enzymes at ER. Pain is very low on right side. CT did not visualized gallbladder, but he had ultrasound last year showing gallstones. ----------------------------------------------------------------    Patient was seen in ER 08/27/2017 for right flank pain. Patient stated pain has been occurring for 2 weeks. Patient states that pain has not improved since ER visit. Patient has been taking otc tylenol and prescribed Tramadol 50 mg.    Flank Pain  This is a new problem. The current episode started 1 to 4 weeks ago (2 weeks). The problem occurs constantly. The problem is unchanged. The quality of the pain is described as cramping and aching. The pain does not radiate. The pain is at a severity of 8/10. The pain is moderate. The pain is the same all the time. The symptoms are aggravated by bending, twisting and standing. Pertinent negatives include no abdominal pain, bladder incontinence, bowel incontinence, chest pain, dysuria, fever,  headaches, leg pain, numbness, paresis, paresthesias, pelvic pain, perianal numbness, tingling, weakness or weight loss. Treatments tried: tramadol and tylenol. The treatment provided mild relief.       No Known Allergies   Current Outpatient Prescriptions:  .  fludrocortisone (FLORINEF) 0.1 MG tablet, Take 1 tablet (100 mcg total) by mouth 2 (two) times daily., Disp: 180 tablet, Rfl: 2 .  lidocaine (LIDODERM) 5 %, Place 1 patch onto the skin every 12 (twelve) hours. Remove & Discard patch within 12 hours or as directed by MD, Disp: 10 patch, Rfl: 0 .  midodrine (PROAMATINE) 10 MG tablet, TAKE ONE TABLET BY MOUTH THREE TIMES A DAY. WITH MEALS, Disp: 90 tablet, Rfl: 3 .  OLANZapine (ZYPREXA) 20 MG tablet, , Disp: , Rfl:  .  PARoxetine (PAXIL) 10 MG tablet, Take 10 mg by mouth daily., Disp: , Rfl:  .  polyethylene glycol powder (GLYCOLAX/MIRALAX) powder, Take 17 g by mouth daily., Disp: 850 g, Rfl: 12 .  pravastatin (PRAVACHOL) 40 MG tablet, TAKE 1 TABLET BY MOUTH EVERY DAY, Disp: 30 tablet, Rfl: 12 .  risperidone (RISPERDAL) 4 MG tablet, Take 1 tablet (4 mg total) by mouth at bedtime., Disp: 90 tablet, Rfl: 1 .  traMADol (ULTRAM) 50 MG tablet, Take 1 tablet (50 mg total) by mouth every 6 (six) hours as needed., Disp: 10 tablet, Rfl: 0  Review of Systems  Constitutional: Negative for appetite change, chills, fever and weight loss.  Respiratory: Negative for chest tightness, shortness of breath and wheezing.   Cardiovascular: Negative for  chest pain and palpitations.  Gastrointestinal: Negative for abdominal pain, bowel incontinence, nausea and vomiting.  Genitourinary: Positive for flank pain. Negative for bladder incontinence, dysuria and pelvic pain.  Neurological: Negative for tingling, weakness, numbness, headaches and paresthesias.    Social History  Substance Use Topics  . Smoking status: Former Smoker    Types: Cigarettes    Quit date: 08/04/2004  . Smokeless tobacco: Former Systems developer     Quit date: 08/04/2004  . Alcohol use No   Objective:   BP (!) 90/50 (BP Location: Right Arm, Patient Position: Sitting, Cuff Size: Large)   Pulse 83   Temp 97.7 F (36.5 C) (Oral)   Resp 16   Wt 248 lb (112.5 kg)   SpO2 96%   BMI 33.63 kg/m  Vitals:   08/29/17 1450  BP: (!) 90/50  Pulse: 83  Resp: 16  Temp: 97.7 F (36.5 C)  TempSrc: Oral  SpO2: 96%  Weight: 248 lb (112.5 kg)     Physical Exam   General Appearance:    Alert, cooperative, no distress  Eyes:    PERRL, conjunctiva/corneas clear, EOM's intact       Lungs:     Clear to auscultation bilaterally, respirations unlabored  Heart:    Regular rate and rhythm  Neurologic:   Awake, alert, oriented x 3. No apparent focal neurological           defect.   Abd:   Tender on right side of abdomen just above iliac creat. No masses. No rebound or guarding.    Results for orders placed or performed in visit on 08/29/17  POCT urinalysis dipstick  Result Value Ref Range   Color, UA Dark Yellow    Clarity, UA Clody    Glucose, UA Neg    Bilirubin, UA Neg    Ketones, UA Positive    Spec Grav, UA >=1.030 (A) 1.010 - 1.025   Blood, UA Neg    pH, UA 6.0 5.0 - 8.0   Protein, UA Positive    Urobilinogen, UA 0.2 0.2 or 1.0 E.U./dL   Nitrite, UA Neg    Leukocytes, UA Negative Negative        Assessment & Plan:     1. Rt flank pain Not typical for gallbladder pain, but he does have history of gallstones. Non-contrast CT from ER was non-diagnostic  - POCT urinalysis dipstick - US Abdomen Complete; Future - traMADol-acetaminophen (ULTRACET) 37.5-325 MG tablet; Take 2 tablets by mouth every 8 (eight) hours as needed.  Dispense: 30 tablet; Refill: 0       Lelon Huh, MD  [facility]

## 2017-09-01 ENCOUNTER — Ambulatory Visit
Admission: RE | Admit: 2017-09-01 | Discharge: 2017-09-01 | Disposition: A | Discharge status: Home / Self Care | Payer: Medicare HMO | Source: Ambulatory Visit | Attending: Family Medicine | Admitting: Family Medicine

## 2017-09-01 DIAGNOSIS — R935 Abnormal findings on diagnostic imaging of other abdominal regions, including retroperitoneum: Secondary | ICD-10-CM | POA: Diagnosis not present

## 2017-09-01 DIAGNOSIS — R109 Unspecified abdominal pain: Secondary | ICD-10-CM | POA: Diagnosis not present

## 2017-10-04 DIAGNOSIS — F200 Paranoid schizophrenia: Secondary | ICD-10-CM | POA: Diagnosis not present

## 2017-10-19 ENCOUNTER — Telehealth: Payer: Self-pay

## 2017-10-19 NOTE — Telephone Encounter (Signed)
Please review. Thanks!  

## 2017-10-19 NOTE — Telephone Encounter (Signed)
Ok to refer? Please advise.  

## 2017-10-19 NOTE — Telephone Encounter (Signed)
I called [facility] and spoke to Glen Raven who states that pt does not need a referral through insurance but their office request a written letter from doctor stating that pt has schizophrenia and will need to see a psychiatrist.Fax # [phone].Phone [phone].

## 2017-10-19 NOTE — Telephone Encounter (Signed)
Patient's sister Osborne Casco is requesting a referral for [facility]. Daneil Dan per Sarah patient could just walk-in and did not need a referral. She states they just left [facility] and they require a referral because patient has [employer].  CB# [phone]

## 2017-10-20 NOTE — Telephone Encounter (Signed)
Patient's sister is calling again wanting to know if this has been done. Please advise. Thanks!

## 2017-10-20 NOTE — Telephone Encounter (Signed)
Please advise 

## 2017-10-21 ENCOUNTER — Encounter: Payer: Self-pay | Admitting: Family Medicine

## 2017-10-21 NOTE — Telephone Encounter (Signed)
Letter has been printed and is ready to pick up or fax.

## 2017-10-21 NOTE — Telephone Encounter (Signed)
Left message notifying pt's sister [name], letter is ready.

## 2017-10-26 ENCOUNTER — Ambulatory Visit (HOSPITAL_COMMUNITY): Payer: Self-pay | Admitting: Psychiatry

## 2017-11-24 ENCOUNTER — Other Ambulatory Visit: Payer: Self-pay | Admitting: Family Medicine

## 2017-11-24 MED ORDER — MIDODRINE HCL 10 MG PO TABS: ORAL_TABLET | ORAL | 1 refills | Status: DC

## 2017-11-24 NOTE — Telephone Encounter (Signed)
Requesting 90 day supply.

## 2017-11-24 NOTE — Telephone Encounter (Signed)
Pt contacted office for refill request on the following medications:  midodrine (PROAMATINE) 10 MG tablet.  90 day supply.  [facility].  CB#[phone]/MW

## 2017-12-28 DIAGNOSIS — F200 Paranoid schizophrenia: Secondary | ICD-10-CM | POA: Diagnosis not present

## 2018-01-18 NOTE — Telephone Encounter (Signed)
Visit scheduled °

## 2018-02-07 NOTE — Progress Notes (Deleted)
Psychiatric Initial Adult Assessment   Patient Identification: [name] MRN:  [id number] Date of Evaluation:  02/07/2018 Referral Source: *** Chief Complaint:   Visit Diagnosis: No diagnosis found.  History of Present Illness:   Frederico Gerling Bonczek is a 67 y.o. year old male with a history of schizophrenia, who is referred to establish care.   Per chart review, patient was seen by Dr. Einar Grad, [facility] in 03/2017. Patient was continued on olanzapine and risperdione. They agree for transfer care as this psychiatrist declines to prescribe clonazepam.        Associated Signs/Symptoms: Depression Symptoms:  {DEPRESSION SYMPTOMS:20000} (Hypo) Manic Symptoms:  {BHH MANIC SYMPTOMS:22872} Anxiety Symptoms:  {BHH ANXIETY SYMPTOMS:22873} Psychotic Symptoms:  {BHH PSYCHOTIC SYMPTOMS:22874} PTSD Symptoms: {BHH PTSD SYMPTOMS:22875}  Past Psychiatric History:  Outpatient:  Psychiatry admission:  Previous suicide attempt:  Past trials of medication:  History of violence:   Previous Psychotropic Medications: {YES/NO:21197}  Substance Abuse History in the last 12 months:  {yes no:314532}  Consequences of Substance Abuse: {BHH CONSEQUENCES OF SUBSTANCE ABUSE:22880}  Past Medical History:  Past Medical History:  Diagnosis Date  . Chicken pox   . Dizziness   . Measles   . Mumps   . Schizophrenia [facility])     Past Surgical History:  Procedure Laterality Date  . ABDOMINAL SURGERY    . COLON SURGERY    . LAPAROTOMY N/A 03/20/2016   Procedure: EXPLORATORY LAPAROTOMY with lysis of adhesions;  Surgeon: Jules Husbands, MD;  Location: [facility] ORS;  Service: General;  Laterality: N/A;  . None      Family Psychiatric History: ***  Family History:  Family History  Problem Relation Age of Onset  . Diabetes Mother   . Heart disease Mother   . Stroke Mother   . Diabetes Father   . Hypertension Father   . Lung cancer Father   . Cancer Father        lung cancer  . Schizophrenia Sister   .  Lung cancer Sister   . Colon cancer Sister     Social History:   Social History   Socioeconomic History  . Marital status: Single    Spouse name: Not on file  . Number of children: 2  . Years of education: Not on file  . Highest education level: Not on file  Social Needs  . Financial resource strain: Not on file  . Food insecurity - worry: Not on file  . Food insecurity - inability: Not on file  . Transportation needs - medical: Not on file  . Transportation needs - non-medical: Not on file  Occupational History  . Occupation: Disabled  Tobacco Use  . Smoking status: Former Smoker    Types: Cigarettes    Last attempt to quit: 08/04/2004    Years since quitting: 13.5  . Smokeless tobacco: Former Systems developer    Quit date: 08/04/2004  Substance and Sexual Activity  . Alcohol use: No    Alcohol/week: 0.0 oz  . Drug use: No  . Sexual activity: Not Currently  Other Topics Concern  . Not on file  Social History Narrative  . Not on file    Additional Social History: ***  Allergies:  No Known Allergies  Metabolic Disorder Labs: No results found for: HGBA1C, MPG No results found for: PROLACTIN Lab Results  Component Value Date   CHOL 235 (H) 02/16/2017   TRIG 275 (H) 02/16/2017   HDL 38 (L) 02/16/2017   CHOLHDL 6.2 (H) 02/16/2017  LDLCALC 142 (H) 02/16/2017   Reader 108 02/21/2012     Current Medications: Current Outpatient Medications  Medication Sig Dispense Refill  . fludrocortisone (FLORINEF) 0.1 MG tablet Take 1 tablet (100 mcg total) by mouth 2 (two) times daily. 180 tablet 2  . lidocaine (LIDODERM) 5 % Place 1 patch onto the skin every 12 (twelve) hours. Remove & Discard patch within 12 hours or as directed by MD (Patient not taking: Reported on 08/29/2017) 10 patch 0  . midodrine (PROAMATINE) 10 MG tablet TAKE ONE TABLET BY MOUTH THREE TIMES A DAY. WITH MEALS 270 tablet 1  . OLANZapine (ZYPREXA) 20 MG tablet     . PARoxetine (PAXIL) 10 MG tablet Take 10 mg by  mouth daily.    . polyethylene glycol powder (GLYCOLAX/MIRALAX) powder Take 17 g by mouth daily. 850 g 12  . pravastatin (PRAVACHOL) 40 MG tablet TAKE 1 TABLET BY MOUTH EVERY DAY 30 tablet 12  . risperidone (RISPERDAL) 4 MG tablet Take 1 tablet (4 mg total) by mouth at bedtime. 90 tablet 1  . traMADol-acetaminophen (ULTRACET) 37.5-325 MG tablet Take 2 tablets by mouth every 8 (eight) hours as needed. 30 tablet 0   No current facility-administered medications for this visit.     Neurologic: Headache: No Seizure: No Paresthesias:No  Musculoskeletal: Strength & Muscle Tone: within normal limits Gait & Station: normal Patient leans: N/A  Psychiatric Specialty Exam: ROS  There were no vitals taken for this visit.There is no height or weight on file to calculate BMI.  General Appearance: Fairly Groomed  Eye Contact:  Good  Speech:  Clear and Coherent  Volume:  Normal  Mood:  {BHH MOOD:22306}  Affect:  {Affect (PAA):22687}  Thought Process:  Coherent and Goal Directed  Orientation:  Full (Time, Place, and Person)  Thought Content:  Logical  Suicidal Thoughts:  {ST/HT (PAA):22692}  Homicidal Thoughts:  {ST/HT (PAA):22692}  Memory:  Immediate;   Good Recent;   Good Remote;   Good  Judgement:  {Judgement (PAA):22694}  Insight:  {Insight (PAA):22695}  Psychomotor Activity:  Normal  Concentration:  Concentration: Good and Attention Span: Good  Recall:  Good  Fund of Knowledge:Good  Language: Good  Akathisia:  No  Handed:  Right  AIMS (if indicated):  N/A  Assets:  Communication Skills Desire for Improvement  ADL's:  Intact  Cognition: WNL  Sleep:  ***   Assessment  Plan  The patient demonstrates the following risk factors for suicide: Chronic risk factors for suicide include: {Chronic Risk Factors for IWPYKDX:83382505}. Acute risk factors for suicide include: {Acute Risk Factors for LZJQBHA:19379024}. Protective factors for this patient include: {Protective Factors for  Suicide OXBD:53299242}. Considering these factors, the overall suicide risk at this point appears to be {Desc; low/moderate/high:110033}. Patient {ACTION; IS/IS AST:41962229} appropriate for outpatient follow up.   Treatment Plan Summary: Plan as above   Norman Clay, MD 2/26/20191:26 PM

## 2018-02-09 ENCOUNTER — Ambulatory Visit (HOSPITAL_COMMUNITY): Payer: Medicare HMO | Admitting: Psychiatry

## 2018-02-23 ENCOUNTER — Ambulatory Visit: Payer: Medicare HMO | Admitting: Psychiatry

## 2018-02-23 ENCOUNTER — Encounter: Payer: Self-pay | Admitting: Psychiatry

## 2018-02-23 ENCOUNTER — Other Ambulatory Visit: Payer: Self-pay

## 2018-02-23 VITALS — BP 138/78 | HR 84 | Temp 98.2°F | Wt 248.8 lb

## 2018-02-23 DIAGNOSIS — G2401 Drug induced subacute dyskinesia: Secondary | ICD-10-CM

## 2018-02-23 DIAGNOSIS — F200 Paranoid schizophrenia: Secondary | ICD-10-CM

## 2018-02-23 MED ORDER — RISPERIDONE 4 MG PO TABS: 4.0000 mg | ORAL_TABLET | Freq: Every day | ORAL | 0 refills | Status: DC

## 2018-02-23 MED ORDER — OLANZAPINE 20 MG PO TABS: 20.0000 mg | ORAL_TABLET | Freq: Every day | ORAL | 0 refills | Status: DC

## 2018-02-23 MED ORDER — PAROXETINE HCL 10 MG PO TABS: 10.0000 mg | ORAL_TABLET | Freq: Every day | ORAL | 0 refills | Status: DC

## 2018-02-23 NOTE — Progress Notes (Signed)
Psychiatric Initial Adult Assessment   Patient Identification: NEVAN CREIGHTON MRN:  [id number] Date of Evaluation:  02/23/2018 Referral Source: Lelon Huh MD Chief Complaint:  ' I am ok.' Chief Complaint    Establish Care; Schizophrenia     Visit Diagnosis:    ICD-10-CM   1. Paranoid schizophrenia ([place]) F20.0 OLANZapine (ZYPREXA) 20 MG tablet    PARoxetine (PAXIL) 10 MG tablet    risperidone (RISPERDAL) 4 MG tablet  2. Tardive dyskinesia G24.01      History of Present Illness: Raliegh is a 67 year old Caucasian male, divorced, on SSD, lives in [place], has a history of schizophrenia, presented to the clinic today to establish care.  Ervie  presented along with his sister who is his healthcare power of attorney.  Sister's name is Ms. Orpha Bur.  Patient as well as sister participated in the evaluation.  His sister provided collateral information.  Patient reports that he is currently doing well on the current combination of medication.  He is on Zyprexa 10 mg ,risperidone 4 mg as well as Paxil 10 mg, which he has been on since the past several years.  He was initially diagnosed with schizophrenia paranoid in his 44s.  He went through a divorce at that time and  that triggered the first episode.  Patient has had several inpatient mental health admissions to [facility] as well as [facility] in the past.  Patient followed up with Dr. Einar Grad here in this clinic in the past.  Patient thereafter followed up with Dr. Hoyt Koch at [facility] for a few years.  Dr. Hoyt Koch relocated from that area and hence patient needed to reestablish with another provider.   Pt does have a history of AH of hearing voices as well as being paranoid and delusional in the past.  Patient also has a history of feeling nervous, restless or anxious in the past.  Patient however denies any psychosis at this time.  He also reports his mood symptoms are stable.  He reports his sleep is good.  His sister and him  live together and and she is supportive.  Patient denies any history of trauma.  He did have a very difficult childhood growing up.  His family was poor and that made his childhood stressful.  He however graduated high school.  He did have 2 children however they both died.  His daughter was a schizophrenic and passed away 5 years ago from medical problems.  His son passed away from an accident several years ago.  He reports he did not have a very good relationship with them since they were taken away from him by his ex-wife.  Associated Signs/Symptoms: Depression Symptoms:  euthymic (Hypo) Manic Symptoms:  denies Anxiety Symptoms:  denies Psychotic Symptoms:  hx of paranoia and AH , currently denies PTSD Symptoms: Negative  Past Psychiatric History: History of schizophrenia paranoid since the age of 39.  He has had several inpatient mental health admissions.  Denies suicide attempts.  [facility] was [facility].  He used to follow up with Dr. Einar Grad in the past.  He also followed up with another clinic-[facility]in [place] with Dr. Hoyt Koch.  Previous Psychotropic Medications: Yes -Haldol, klonopin, seroquel  Substance Abuse History in the last 12 months:  No.  Consequences of Substance Abuse: Negative  Past Medical History:  Past Medical History:  Diagnosis Date  . Chicken pox   . Dizziness   . Measles   .  Mumps   . Schizophrenia [facility])     Past Surgical History:  Procedure Laterality Date  . ABDOMINAL SURGERY    . COLON SURGERY    . LAPAROTOMY N/A 03/20/2016   Procedure: EXPLORATORY LAPAROTOMY with lysis of adhesions;  Surgeon: Jules Husbands, MD;  Location: [facility] ORS;  Service: General;  Laterality: N/A;  . None      Family Psychiatric History: Extensive history of mental health problems in his family.  Brother-schizophrenia- committed suicide , daughter-schizophrenia, nephew-mental health problems, niece-mental health problems,  mom-mental illness  Family History:  Family History  Problem Relation Age of Onset  . Diabetes Mother   . Heart disease Mother   . Stroke Mother   . Diabetes Father   . Hypertension Father   . Lung cancer Father   . Cancer Father        lung cancer  . Schizophrenia Sister   . Lung cancer Sister   . Colon cancer Sister     Social History:   Social History   Socioeconomic History  . Marital status: Divorced    Spouse name: None  . Number of children: 2  . Years of education: None  . Highest education level: High school graduate  Social Needs  . Financial resource strain: Not hard at all  . Food insecurity - worry: Never true  . Food insecurity - inability: Never true  . Transportation needs - medical: No  . Transportation needs - non-medical: No  Occupational History  . Occupation: Disabled  Tobacco Use  . Smoking status: Former Smoker    Types: Cigarettes    Last attempt to quit: 08/04/2004    Years since quitting: 13.5  . Smokeless tobacco: Former Systems developer    Quit date: 08/04/2004  Substance and Sexual Activity  . Alcohol use: No    Alcohol/week: 0.0 oz  . Drug use: No  . Sexual activity: Not Currently  Other Topics Concern  . None  Social History Narrative  . None    Additional Social History: Patient is divorced.  He lives in [place] with her sister who is also his healthcare power of attorney.  He is on SSD.   Allergies:  No Known Allergies  Metabolic Disorder Labs: No results found for: HGBA1C, MPG No results found for: PROLACTIN Lab Results  Component Value Date   CHOL 235 (H) 02/16/2017   TRIG 275 (H) 02/16/2017   HDL 38 (L) 02/16/2017   CHOLHDL 6.2 (H) 02/16/2017   LDLCALC 142 (H) 02/16/2017   LDLCALC 108 02/21/2012     Current Medications: Current Outpatient Medications  Medication Sig Dispense Refill  . midodrine (PROAMATINE) 10 MG tablet TAKE ONE TABLET BY MOUTH THREE TIMES A DAY. WITH MEALS 270 tablet 1  . OLANZapine (ZYPREXA) 20 MG  tablet Take 1 tablet (20 mg total) by mouth at bedtime. 90 tablet 0  . PARoxetine (PAXIL) 10 MG tablet Take 1 tablet (10 mg total) by mouth daily. 90 tablet 0  . polyethylene glycol powder (GLYCOLAX/MIRALAX) powder Take 17 g by mouth daily. 850 g 12  . risperidone (RISPERDAL) 4 MG tablet Take 1 tablet (4 mg total) by mouth at bedtime. 90 tablet 0   No current facility-administered medications for this visit.     Neurologic: Headache: No Seizure: No Paresthesias:No  Musculoskeletal: Strength & Muscle Tone: within normal limits Gait & Station: normal Patient leans: N/A  Psychiatric Specialty Exam: Review of Systems  Psychiatric/Behavioral: Negative for depression, hallucinations, memory loss, substance abuse and  suicidal ideas. The patient is not nervous/anxious and does not have insomnia.   All other systems reviewed and are negative.   Blood pressure 138/78, pulse 84, temperature 98.2 F (36.8 C), temperature source Oral, weight 248 lb 12.8 oz (112.9 kg).Body mass index is 33.74 kg/m.  General Appearance: Casual  Eye Contact:  Fair  Speech:  Clear and Coherent  Volume:  Normal  Mood:  Euthymic  Affect:  Appropriate  Thought Process:  Goal Directed and Descriptions of Associations: Intact  Orientation:  Full (Time, Place, and Person)  Thought Content:  Logical  Suicidal Thoughts:  No  Homicidal Thoughts:  No  Memory:  Immediate;   Fair Recent;   Fair Remote;   Fair  Judgement:  Fair  Insight:  Fair  Psychomotor Activity:  Normal  Concentration:  Concentration: Fair and Attention Span: Fair  Recall:  [employer] of Knowledge:Fair  Language: Fair  Akathisia:  No  Handed:  Right  AIMS (if indicated):  TD, 3   Assets:  Communication Skills Desire for Improvement Financial Resources/Insurance Housing Social Support  ADL's:  Intact  Cognition: WNL  Sleep:  fair    Treatment Plan Summary:Giordan is a 67 year old Caucasian male, divorced, lives in [place], [place],  has a history of paranoid schizophrenia, TD, presented to the clinic today to establish care.  Patient has a chronic history of mental health issues and is currently stable on the combination of Zyprexa, risperidone on Paxil which he has been on since the past several years.  Patient is biologically predisposed given his extensive family history of mental health problems.  He however has good social support from his sister with whom he lives now.  Patient had to transfer his care to this clinic since his previous provider relocated.  Discussed medications with patient plan as noted below. Medication management and Plan see below Schizophrenia Continue Zyprexa 10 mg p.o. nightly Continue risperidone 4 mg p.o. nightly Continue Paxil 10 mg p.o. nightly   Tardive dyskinesia A[place] equals 3 Patient does have some movements around his mouth and also his tongue.  He however is not distressed by it and has minimal awareness. We will continue to monitor.  Provided medication education, discussed the risk of being on 2 antipsychotic medications including metabolic syndrome.  Patient as well as sister notes that he has been stable on this combination and would like to stay on the same.  Patient has had his blood work done recently including his EKG by his PMD.  Patient to sign a release so that we can obtain his most recent labs as well as EKG report from his PMD.  I have reviewed medical records from his previous provider.  Discussed exercise with patient.  Also discussed watching his diet, calorie counting.  Discussed taking up a hobby which he can work on.  Follow up in clinic in 2-3 months or sooner if needed.  More than 50 % of the time was spent for psychoeducation and supportive psychotherapy and care coordination.  This note was generated in part or whole with voice recognition software. Voice recognition is usually quite accurate but there are transcription errors that can and very often do  occur. I apologize for any typographical errors that were not detected and corrected.      Ursula Alert, MD 3/14/20193:48 PM

## 2018-03-20 ENCOUNTER — Telehealth: Payer: Self-pay | Admitting: Family Medicine

## 2018-03-20 NOTE — Telephone Encounter (Signed)
Faxed Labs 4./27/18 93 Pages) to [facility] on 7.26.18 CC

## 2018-05-01 ENCOUNTER — Other Ambulatory Visit: Payer: Self-pay | Admitting: Family Medicine

## 2018-05-10 ENCOUNTER — Other Ambulatory Visit: Payer: Self-pay | Admitting: Family Medicine

## 2018-05-23 ENCOUNTER — Ambulatory Visit (INDEPENDENT_AMBULATORY_CARE_PROVIDER_SITE_OTHER): Payer: Medicare HMO | Admitting: Family Medicine

## 2018-05-23 ENCOUNTER — Encounter: Payer: Self-pay | Admitting: Family Medicine

## 2018-05-23 VITALS — BP 120/70 | HR 61 | Temp 97.7°F | Resp 16 | Wt 233.0 lb

## 2018-05-23 DIAGNOSIS — L03011 Cellulitis of right finger: Secondary | ICD-10-CM

## 2018-05-23 DIAGNOSIS — I951 Orthostatic hypotension: Secondary | ICD-10-CM | POA: Diagnosis not present

## 2018-05-23 MED ORDER — CEPHALEXIN 500 MG PO CAPS: 500.0000 mg | ORAL_CAPSULE | Freq: Four times a day (QID) | ORAL | 0 refills | Status: AC

## 2018-05-23 MED ORDER — MIDODRINE HCL 10 MG PO TABS: ORAL_TABLET | ORAL | 3 refills | Status: DC

## 2018-05-23 NOTE — Progress Notes (Signed)
       Patient: [name] Male    DOB: November 12, 1951   67 y.o.   MRN: [id number] Visit Date: 05/23/2018  Today's Provider: Lelon Huh, MD   Chief Complaint  Patient presents with  . Nail Problem   Subjective:    HPI  Patient has swelling in right thumb for several days. Patient does have an old injury in the same thumb from 3 years ago. However patient states he noticed his right thumb was slightly red and swollen. Patient states there is some slight pain in the thumb.   No Known Allergies   Current Outpatient Medications:  .  midodrine (PROAMATINE) 10 MG tablet, TAKE 1 TABLET BY MOUTH 3 TIMES A DAY WITH MEALS, Disp: 270 tablet, Rfl: 3 .  OLANZapine (ZYPREXA) 20 MG tablet, Take 1 tablet (20 mg total) by mouth at bedtime., Disp: 90 tablet, Rfl: 0 .  PARoxetine (PAXIL) 10 MG tablet, Take 1 tablet (10 mg total) by mouth daily., Disp: 90 tablet, Rfl: 0 .  risperidone (RISPERDAL) 4 MG tablet, Take 1 tablet (4 mg total) by mouth at bedtime., Disp: 90 tablet, Rfl: 0 .  polyethylene glycol powder (GLYCOLAX/MIRALAX) powder, MIX 17GM (AS MARKED IN BOTTLE TOP) IN 8 OUNCES OF WATER, MIX AND DRINK ONCE A DAY AS DIRECTED. (Patient not taking: Reported on 05/23/2018), Disp: 850 g, Rfl: 12  Review of Systems  Constitutional: Negative for appetite change, chills and fever.  Respiratory: Negative for chest tightness, shortness of breath and wheezing.   Cardiovascular: Negative for chest pain and palpitations.  Gastrointestinal: Negative for abdominal pain, nausea and vomiting.    Social History   Tobacco Use  . Smoking status: Former Smoker    Types: Cigarettes    Last attempt to quit: 08/04/2004    Years since quitting: 13.8  . Smokeless tobacco: Former Systems developer    Quit date: 08/04/2004  Substance Use Topics  . Alcohol use: No    Alcohol/week: 0.0 oz   Objective:   BP 120/70 (BP Location: Right Arm, Patient Position: Sitting, Cuff Size: Large)   Pulse 61   Temp 97.7 F (36.5 C)  (Oral)   Resp 16   Wt 233 lb (105.7 kg)   SpO2 99%   BMI 31.60 kg/m  Vitals:   05/23/18 1405  BP: 120/70  Pulse: 61  Resp: 16  Temp: 97.7 F (36.5 C)  TempSrc: Oral  SpO2: 99%  Weight: 233 lb (105.7 kg)     Physical Exam  Moderate swelling and faint erythema palmer aspect of right distal thumb. Minimal tenderness.     Assessment & Plan:     1. Cellulitis of finger of right hand  - cephALEXin (KEFLEX) 500 MG capsule; Take 1 capsule (500 mg total) by mouth 4 (four) times daily for 7 days.  Dispense: 28 capsule; Refill: 0 Avoid repetitive trauma, recommend wear gloves when on bicycle.  Call if symptoms change or if not rapidly improving.     2. Orthostatic hypotension Stable. Continue current medications.  Refill  - midodrine (PROAMATINE) 10 MG tablet; TAKE 1 TABLET BY MOUTH 3 TIMES A DAY WITH MEALS  Dispense: 270 tablet; Refill: 3       Lelon Huh, MD  [facility]

## 2018-05-26 ENCOUNTER — Encounter: Payer: Self-pay | Admitting: Psychiatry

## 2018-05-26 ENCOUNTER — Other Ambulatory Visit: Payer: Self-pay

## 2018-05-26 ENCOUNTER — Ambulatory Visit (INDEPENDENT_AMBULATORY_CARE_PROVIDER_SITE_OTHER): Payer: Medicare HMO | Admitting: Psychiatry

## 2018-05-26 VITALS — BP 111/69 | HR 80 | Wt 237.8 lb

## 2018-05-26 DIAGNOSIS — F200 Paranoid schizophrenia: Secondary | ICD-10-CM | POA: Diagnosis not present

## 2018-05-26 DIAGNOSIS — G2401 Drug induced subacute dyskinesia: Secondary | ICD-10-CM | POA: Diagnosis not present

## 2018-05-26 MED ORDER — OLANZAPINE 20 MG PO TABS: 20.0000 mg | ORAL_TABLET | Freq: Every day | ORAL | 1 refills | Status: DC

## 2018-05-26 MED ORDER — PAROXETINE HCL 10 MG PO TABS: 10.0000 mg | ORAL_TABLET | Freq: Every day | ORAL | 1 refills | Status: DC

## 2018-05-26 MED ORDER — RISPERIDONE 4 MG PO TABS: 4.0000 mg | ORAL_TABLET | Freq: Every day | ORAL | 1 refills | Status: DC

## 2018-05-26 NOTE — Patient Instructions (Signed)

## 2018-05-26 NOTE — Progress Notes (Signed)
[facility] MD OP Progress Note  05/26/2018 1:03 PM [name]  MRN:  [id number]  Chief Complaint: ' I am here for follow up.' Chief Complaint    Follow-up; Medication Refill     HPI: [name] is a 67 year old Caucasian male, divorced, on SSD, lives in [place], has a history of schizophrenia, presented to the clinic today for a follow-up visit.  Patient presented along with his sister who is his healthcare power of attorney-Ms. Orpha Bur.  Patient today reports he is compliant on his medications as prescribed.  He reports his mood as fair.  He denies any suicidality.  He denies any perceptual disturbances.  He reports he has been exercising and trying to ride his bike.  He reports he has been trying to lose some weight by doing so.  He reports he accidentally injured his right thumb while working with his sister.  He reports he is currently on medications for that.  He denies any pain at this time from the same.  He reports sleep is good.  He reports his appetite is fair.  He continues to have some abnormal movements of his tongue.  He does have TD.  He however reports he does not want to be on medication since it does not bother him. Visit Diagnosis:    ICD-10-CM   1. Paranoid schizophrenia ([place]) F20.0 OLANZapine (ZYPREXA) 20 MG tablet    PARoxetine (PAXIL) 10 MG tablet    risperidone (RISPERDAL) 4 MG tablet  2. Tardive dyskinesia G24.01     Past Psychiatric History: Reviewed past psychiatric history from my progress note on 02/23/2018.  Past trials of Haldol, Klonopin, Seroquel.  Past Medical History:  Past Medical History:  Diagnosis Date  . Chicken pox   . Dizziness   . Measles   . Mumps   . Schizophrenia [facility])     Past Surgical History:  Procedure Laterality Date  . ABDOMINAL SURGERY    . COLON SURGERY    . LAPAROTOMY N/A 03/20/2016   Procedure: EXPLORATORY LAPAROTOMY with lysis of adhesions;  Surgeon: Jules Husbands, MD;  Location: [facility] ORS;  Service: General;   Laterality: N/A;  . None      Family Psychiatric History: I have reviewed family psychiatric history from my progress note on 02/23/2018.  Family History:  Family History  Problem Relation Age of Onset  . Diabetes Mother   . Heart disease Mother   . Stroke Mother   . Diabetes Father   . Hypertension Father   . Lung cancer Father   . Cancer Father        lung cancer  . Schizophrenia Sister   . Lung cancer Sister   . Colon cancer Sister    Substance abuse history: Denies  Social History: Have reviewed social history from my progress note on 02/23/2018. Social History   Socioeconomic History  . Marital status: Divorced    Spouse name: Not on file  . Number of children: 2  . Years of education: Not on file  . Highest education level: High school graduate  Occupational History  . Occupation: Disabled  Social Needs  . Financial resource strain: Not hard at all  . Food insecurity:    Worry: Never true    Inability: Never true  . Transportation needs:    Medical: No    Non-medical: No  Tobacco Use  . Smoking status: Former Smoker    Types: Cigarettes    Last attempt to quit: 08/04/2004  Years since quitting: 13.8  . Smokeless tobacco: Former Systems developer    Quit date: 08/04/2004  Substance and Sexual Activity  . Alcohol use: No    Alcohol/week: 0.0 oz  . Drug use: No  . Sexual activity: Not Currently  Lifestyle  . Physical activity:    Days per week: 0 days    Minutes per session: 0 min  . Stress: Not on file  Relationships  . Social connections:    Talks on phone: More than three times a week    Gets together: More than three times a week    Attends religious service: More than 4 times per year    Active member of club or organization: No    Attends meetings of clubs or organizations: Never    Relationship status: Divorced  Other Topics Concern  . Not on file  Social History Narrative  . Not on file    Allergies: No Known Allergies  Metabolic Disorder  Labs: No results found for: HGBA1C, MPG No results found for: PROLACTIN Lab Results  Component Value Date   CHOL 235 (H) 02/16/2017   TRIG 275 (H) 02/16/2017   HDL 38 (L) 02/16/2017   CHOLHDL 6.2 (H) 02/16/2017   LDLCALC 142 (H) 02/16/2017   LDLCALC 108 02/21/2012   Lab Results  Component Value Date   TSH 1.333 05/04/2016   TSH 2.01 08/13/2011    Therapeutic Level Labs: No results found for: LITHIUM No results found for: VALPROATE No components found for:  CBMZ  Current Medications: Current Outpatient Medications  Medication Sig Dispense Refill  . cephALEXin (KEFLEX) 500 MG capsule Take 1 capsule (500 mg total) by mouth 4 (four) times daily for 7 days. 28 capsule 0  . midodrine (PROAMATINE) 10 MG tablet TAKE 1 TABLET BY MOUTH 3 TIMES A DAY WITH MEALS 270 tablet 3  . OLANZapine (ZYPREXA) 20 MG tablet Take 1 tablet (20 mg total) by mouth at bedtime. 90 tablet 1  . PARoxetine (PAXIL) 10 MG tablet Take 1 tablet (10 mg total) by mouth daily. 90 tablet 1  . pravastatin (PRAVACHOL) 40 MG tablet     . risperidone (RISPERDAL) 4 MG tablet Take 1 tablet (4 mg total) by mouth at bedtime. 90 tablet 1   No current facility-administered medications for this visit.      Musculoskeletal: Strength & Muscle Tone: within normal limits Gait & Station: normal Patient leans: N/A  Psychiatric Specialty Exam: Review of Systems  Psychiatric/Behavioral: The patient is not nervous/anxious.   All other systems reviewed and are negative.   Blood pressure 111/69, pulse 80, weight 237 lb 12.8 oz (107.9 kg).Body mass index is 32.25 kg/m.  General Appearance: Casual  Eye Contact:  Fair  Speech:  Clear and Coherent  Volume:  Normal  Mood:  Euthymic  Affect:  Congruent  Thought Process:  Goal Directed and Descriptions of Associations: Intact  Orientation:  Full (Time, Place, and Person)  Thought Content: Logical   Suicidal Thoughts:  No  Homicidal Thoughts:  No  Memory:  Immediate;    Fair Recent;   Fair Remote;   Fair  Judgement:  Fair  Insight:  Fair  Psychomotor Activity:  Normal  Concentration:  Concentration: Fair and Attention Span: Fair  Recall:  [employer] of Knowledge: Fair  Language: Fair  Akathisia:  No  Handed:  Right  AIMS (if indicated): TD 3  Assets:  Communication Skills Desire for Improvement Social Support  ADL's:  Intact  Cognition: WNL  Sleep:  Fair   Screenings: AIMS     Office Visit from 02/23/2018 in [facility] Total Score  3    PHQ2-9     Office Visit from 02/16/2017 in [facility]  PHQ-2 Total Score  0  PHQ-9 Total Score  0       Assessment and Plan: Dreshawn is a 67 year old Caucasian male, divorced, lives in [place], on [place], has a history of paranoid schizophrenia, TD, presented to the clinic today for a follow-up visit.  Patient has a chronic history of mental health issues and is currently stable on the combination of Zyprexa, risperidone, Paxil.  He is biologically predisposed given his extensive family history of mental health problems.  Patient continues to have good social support from his sister with whom he lives.  She is also his power of attorney for healthcare.  Patient denies any changes at this time and hence will continue plan as noted below.  Plan Schizophrenia Continue Zyprexa 10 mg p.o. nightly Continue risperidone 4 mg p.o. nightly Continue Paxil 10 mg p.o. nightly  For tardive dyskinesia [place] equals 3 Patient continues to have movements around his mouth and his tongue.  He however has minimal awareness and is not distressed by it.  I did medication education, discussed the risk of being on 2 antipsychotic medication including metabolic syndrome.  Follow-up in clinic in 4 months or sooner if needed.  More than 50 % of the time was spent for psychoeducation and supportive psychotherapy and care coordination.  This note was generated in part or whole with  voice recognition software. Voice recognition is usually quite accurate but there are transcription errors that can and very often do occur. I apologize for any typographical errors that were not detected and corrected.         Ursula Alert, MD 05/26/2018, 1:03 PM

## 2018-06-15 ENCOUNTER — Encounter: Payer: Self-pay | Admitting: Emergency Medicine

## 2018-06-15 ENCOUNTER — Emergency Department: Payer: Medicare HMO

## 2018-06-15 ENCOUNTER — Emergency Department
Admission: EM | Admit: 2018-06-15 | Discharge: 2018-06-15 | Disposition: A | Discharge status: Home / Self Care | Payer: Medicare HMO | Attending: Emergency Medicine | Admitting: Emergency Medicine

## 2018-06-15 ENCOUNTER — Other Ambulatory Visit: Payer: Self-pay

## 2018-06-15 DIAGNOSIS — Y929 Unspecified place or not applicable: Secondary | ICD-10-CM | POA: Diagnosis not present

## 2018-06-15 DIAGNOSIS — Y939 Activity, unspecified: Secondary | ICD-10-CM | POA: Insufficient documentation

## 2018-06-15 DIAGNOSIS — Z79899 Other long term (current) drug therapy: Secondary | ICD-10-CM | POA: Insufficient documentation

## 2018-06-15 DIAGNOSIS — R55 Syncope and collapse: Secondary | ICD-10-CM

## 2018-06-15 DIAGNOSIS — W19XXXA Unspecified fall, initial encounter: Secondary | ICD-10-CM

## 2018-06-15 DIAGNOSIS — N183 Chronic kidney disease, stage 3 (moderate): Secondary | ICD-10-CM | POA: Insufficient documentation

## 2018-06-15 DIAGNOSIS — S92302A Fracture of unspecified metatarsal bone(s), left foot, initial encounter for closed fracture: Secondary | ICD-10-CM

## 2018-06-15 DIAGNOSIS — I951 Orthostatic hypotension: Secondary | ICD-10-CM

## 2018-06-15 DIAGNOSIS — W010XXA Fall on same level from slipping, tripping and stumbling without subsequent striking against object, initial encounter: Secondary | ICD-10-CM | POA: Insufficient documentation

## 2018-06-15 DIAGNOSIS — S92322A Displaced fracture of second metatarsal bone, left foot, initial encounter for closed fracture: Secondary | ICD-10-CM | POA: Insufficient documentation

## 2018-06-15 DIAGNOSIS — Z87891 Personal history of nicotine dependence: Secondary | ICD-10-CM | POA: Diagnosis not present

## 2018-06-15 DIAGNOSIS — S99922A Unspecified injury of left foot, initial encounter: Secondary | ICD-10-CM | POA: Diagnosis present

## 2018-06-15 DIAGNOSIS — R42 Dizziness and giddiness: Secondary | ICD-10-CM | POA: Insufficient documentation

## 2018-06-15 DIAGNOSIS — Y999 Unspecified external cause status: Secondary | ICD-10-CM | POA: Insufficient documentation

## 2018-06-15 DIAGNOSIS — S0990XA Unspecified injury of head, initial encounter: Secondary | ICD-10-CM | POA: Diagnosis not present

## 2018-06-15 DIAGNOSIS — S92332A Displaced fracture of third metatarsal bone, left foot, initial encounter for closed fracture: Secondary | ICD-10-CM | POA: Diagnosis not present

## 2018-06-15 LAB — URINALYSIS, COMPLETE (UACMP) WITH MICROSCOPIC
BACTERIA UA: NONE SEEN
BILIRUBIN URINE: NEGATIVE
Glucose, UA: NEGATIVE mg/dL
Hgb urine dipstick: NEGATIVE
KETONES UR: NEGATIVE mg/dL
LEUKOCYTES UA: NEGATIVE
Nitrite: NEGATIVE
PROTEIN: NEGATIVE mg/dL
SQUAMOUS EPITHELIAL / LPF: NONE SEEN (ref 0–5)
Specific Gravity, Urine: 1.009 (ref 1.005–1.030)
pH: 7 (ref 5.0–8.0)

## 2018-06-15 LAB — BASIC METABOLIC PANEL
ANION GAP: 11 (ref 5–15)
BUN: 22 mg/dL (ref 8–23)
CHLORIDE: 106 mmol/L (ref 98–111)
CO2: 22 mmol/L (ref 22–32)
Calcium: 9.5 mg/dL (ref 8.9–10.3)
Creatinine, Ser: 1.87 mg/dL — ABNORMAL HIGH (ref 0.61–1.24)
GFR calc non Af Amer: 35 mL/min — ABNORMAL LOW (ref >60)
GFR, EST AFRICAN AMERICAN: 41 mL/min — AB (ref >60)
Glucose, Bld: 174 mg/dL — ABNORMAL HIGH (ref 70–99)
POTASSIUM: 4.7 mmol/L (ref 3.5–5.1)
SODIUM: 139 mmol/L (ref 135–145)

## 2018-06-15 LAB — CBC
HEMATOCRIT: 43 % (ref 40.0–52.0)
HEMOGLOBIN: 14.3 g/dL (ref 13.0–18.0)
MCH: 28.9 pg (ref 26.0–34.0)
MCHC: 33.2 g/dL (ref 32.0–36.0)
MCV: 86.8 fL (ref 80.0–100.0)
Platelets: 211 10*3/uL (ref 150–440)
RBC: 4.95 MIL/uL (ref 4.40–5.90)
RDW: 13.7 % (ref 11.5–14.5)
WBC: 12.1 10*3/uL — AB (ref 3.8–10.6)

## 2018-06-15 MED ORDER — MIDODRINE HCL 10 MG PO TABS: ORAL_TABLET | ORAL | 0 refills | Status: DC

## 2018-06-15 MED ORDER — SODIUM CHLORIDE 0.9 % IV BOLUS
1000.0000 mL | Freq: Once | INTRAVENOUS | Status: AC
Start: 2018-06-15 — End: 2018-06-15
  Administered 2018-06-15: 1000 mL via INTRAVENOUS

## 2018-06-15 MED ORDER — MIDODRINE HCL 5 MG PO TABS
10.0000 mg | ORAL_TABLET | ORAL | Status: AC
  Administered 2018-06-15: 10 mg via ORAL
  Filled 2018-06-15: qty 2

## 2018-06-15 NOTE — ED Provider Notes (Signed)
[facility] Emergency Department Provider Note   ____________________________________________   First MD Initiated Contact with Patient 06/15/18 1513     (approximate)  I have reviewed the triage vital signs and the nursing notes.   HISTORY  Chief Complaint Fall and Near Syncope    HPI [name] is a 67 y.o. male history of chronic kidney disease, hypotension, presents for evaluation of left foot pain  Patient reports that he gets lightheaded frequently, sometimes stumbles and does have a history of some falls.  This morning when he got up from bed he got up felt slightly lightheaded and then fell falling forward with his left foot catching below him.  Denies injuring his head, no neck pain.  Reports his left foot is been painful and swollen since then.  Also last night he had an episode where he got lightheaded, but did not fall with an injury.  Then again later today also felt lightheaded when he was try to get up to use the bathroom.  He reports is been very difficult to walk on his left foot because of pain in the middle of foot.  No other pain.  No chest pain or trouble breathing.  Reports a sharp pain in the left foot with movement and standing on it.  Denies dizziness like the room spinning.  Other reports he starts to feel faint.  Family is at the bedside including his mother and sister report he has a history of episodic low blood pressures, but currently is on a blood pressure medicine.  They checked his blood pressure last night and it was in the 90s   Past Medical History:  Diagnosis Date  . Chicken pox   . Dizziness   . Measles   . Mumps   . Schizophrenia [facility])     Patient Active Problem List   Diagnosis Date Noted  . Cholecystitis 05/18/2016  . Chronic nausea 05/17/2016  . Hypoalbuminemia 05/14/2016  . Anemia 05/11/2016  . Hypotension 05/04/2016  . SBO (small bowel obstruction) ([place]) 03/18/2016  . Dizziness 05/16/2015  . Edema  05/15/2015  . Venous stasis dermatitis 05/15/2015  . History of adenomatous polyp of colon 12/01/2009  . Schizophrenia, simple, chronic ([place]) 08/21/2009  . CKD (chronic kidney disease) stage 3, GFR 30-59 ml/min (HCC) 08/20/2009  . Hyperlipemia 08/19/2009    Past Surgical History:  Procedure Laterality Date  . ABDOMINAL SURGERY    . COLON SURGERY    . LAPAROTOMY N/A 03/20/2016   Procedure: EXPLORATORY LAPAROTOMY with lysis of adhesions;  Surgeon: Jules Husbands, MD;  Location: [facility] ORS;  Service: General;  Laterality: N/A;  . None      Prior to Admission medications   Medication Sig Start Date End Date Taking? Authorizing Provider  midodrine (PROAMATINE) 10 MG tablet TAKE 1 TABLET BY MOUTH 3 TIMES A DAY WITH MEALS 06/15/18   Delman Kitten, MD  OLANZapine (ZYPREXA) 20 MG tablet Take 1 tablet (20 mg total) by mouth at bedtime. 05/26/18   Ursula Alert, MD  PARoxetine (PAXIL) 10 MG tablet Take 1 tablet (10 mg total) by mouth daily. 05/26/18   Ursula Alert, MD  pravastatin (PRAVACHOL) 40 MG tablet  05/01/18   [provider]  risperidone (RISPERDAL) 4 MG tablet Take 1 tablet (4 mg total) by mouth at bedtime. 05/26/18   Ursula Alert, MD    Allergies Patient has no known allergies.  Family History  Problem Relation Age of Onset  . Diabetes Mother   .  Heart disease Mother   . Stroke Mother   . Diabetes Father   . Hypertension Father   . Lung cancer Father   . Cancer Father        lung cancer  . Schizophrenia Sister   . Lung cancer Sister   . Colon cancer Sister     Social History Social History   Tobacco Use  . Smoking status: Former Smoker    Types: Cigarettes    Last attempt to quit: 08/04/2004    Years since quitting: 13.8  . Smokeless tobacco: Former Systems developer    Quit date: 08/04/2004  Substance Use Topics  . Alcohol use: No    Alcohol/week: 0.0 oz  . Drug use: No    Review of Systems Constitutional: No fever/chills Eyes: No visual changes. ENT: No sore  throat. Cardiovascular: Denies chest pain. Respiratory: Denies shortness of breath. Gastrointestinal: No abdominal pain.  No nausea, no vomiting.  No diarrhea.  No constipation. Genitourinary: Negative for dysuria. Musculoskeletal: Negative for back pain. Skin: Negative for rash. Neurological: Negative for headaches, focal weakness or numbness.    ____________________________________________   PHYSICAL EXAM:  VITAL SIGNS: ED Triage Vitals  Enc Vitals Group     BP 06/15/18 1353 111/61     Pulse Rate 06/15/18 1353 (!) 104     Resp 06/15/18 1353 16     Temp 06/15/18 1353 98.1 F (36.7 C)     Temp Source 06/15/18 1353 Oral     SpO2 06/15/18 1353 98 %     Weight 06/15/18 1349 233 lb (105.7 kg)     Height 06/15/18 1349 6' (1.829 m)     Head Circumference --      Peak Flow --      Pain Score 06/15/18 1349 5     Pain Loc --      Pain Edu? --      Excl. in [place]? --     Constitutional: Alert and oriented. Well appearing and in no acute distress. Eyes: Conjunctivae are normal. Head: Atraumatic. Nose: No congestion/rhinnorhea. Mouth/Throat: Mucous membranes are moist. Neck: No stridor.  No midline cervical tenderness.  Patient denies any neck pain. Cardiovascular: Normal rate, regular rhythm. Grossly normal heart sounds.  Good peripheral circulation. Respiratory: Normal respiratory effort.  No retractions. Lungs CTAB. Gastrointestinal: Soft and nontender. No distention. Musculoskeletal:   RIGHT Right upper extremity demonstrates normal strength, good use of all muscles. No edema bruising or contusions of the right shoulder/upper arm, right elbow, right forearm / hand. Full range of motion of the right right upper extremity without pain. No evidence of trauma. Strong radial pulse. Intact median/ulnar/radial neuro-muscular exam.  LEFT Left upper extremity demonstrates normal strength, good use of all muscles. No edema bruising or contusions of the left shoulder/upper arm, left  elbow, left forearm / hand. Full range of motion of the left  upper extremity without pain. No evidence of trauma. Strong radial pulse. Intact median/ulnar/radial neuro-muscular exam.  Lower Extremities  No edema.  Strong posterior tibial.  Discomfort and swelling limited ability to palpate the dorsalis pedis of the left foot.  Normal neuro-motor function lower extremities bilateral.  RIGHT Right lower extremity demonstrates normal strength, good use of all muscles. No edema bruising or contusions of the right hip, right knee, right ankle. Full range of motion of the right lower extremity without pain. No pain on axial loading. No evidence of trauma.  LEFT Left lower extremity demonstrates normal strength, good use of all muscles. No  edema bruising or contusions of the hip,  knee, ankle. Full range of motion of the left lower extremity but reports pain in the middle of the left foot.  The patient's left foot is moderately swollen, there is bruising across the midfoot and fourth and fifth phalanxes.  Capillary refill is normal.  Neurologic:  Normal speech and language. No gross focal neurologic deficits are appreciated.  Skin:  Skin is warm, dry and intact. No rash noted. Psychiatric: Mood and affect are normal. Speech and behavior are normal.  ____________________________________________   LABS (all labs ordered are listed, but only abnormal results are displayed)  Labs Reviewed  BASIC METABOLIC PANEL - Abnormal; Notable for the following components:      Result Value   Glucose, Bld 174 (*)    Creatinine, Ser 1.87 (*)    GFR calc non Af Amer 35 (*)    GFR calc Af Amer 41 (*)    All other components within normal limits  CBC - Abnormal; Notable for the following components:   WBC 12.1 (*)    All other components within normal limits  URINALYSIS, COMPLETE (UACMP) WITH MICROSCOPIC - Abnormal; Notable for the following components:   Color, Urine YELLOW (*)    APPearance CLEAR (*)     All other components within normal limits  CBG MONITORING, ED   ____________________________________________  EKG  Reviewed and by me at 1351 Heart rate 110 Cures 70 QTc 420 Sinus tachycardia, no evidence of ischemia ____________________________________________  RADIOLOGY  Ct Head Wo Contrast  Result Date: 06/15/2018 CLINICAL DATA:  Fall, dizziness EXAM: CT HEAD WITHOUT CONTRAST TECHNIQUE: Contiguous axial images were obtained from the base of the skull through the vertex without intravenous contrast. COMPARISON:  05/18/2016 FINDINGS: Brain: Mild diffuse cerebral atrophy. No acute intracranial abnormality. Specifically, no hemorrhage, hydrocephalus, mass lesion, acute infarction, or significant intracranial injury. Vascular: No hyperdense vessel or unexpected calcification. Skull: No acute calvarial abnormality. Sinuses/Orbits: Visualized paranasal sinuses and mastoids clear. Orbital soft tissues unremarkable. Other: None IMPRESSION: Diffuse cerebral atrophy.  No acute intracranial abnormality. Electronically Signed   By: Rolm Baptise M.D.   On: 06/15/2018 16:07   Dg Foot Complete Left  Result Date: 06/15/2018 CLINICAL DATA:  Left foot pain and swelling since last evening. EXAM: LEFT FOOT - COMPLETE 3+ VIEW COMPARISON:  None. FINDINGS: Comminuted displaced fractures involving the bases of the second and third metatarsals with suspected extension to the adjacent TMT joints. This finding is also associated with widening of Lisfranc joint. There is an age-indeterminate ossicle adjacent to the medial aspect of the base of the first metatarsal likely the sequela of age-indeterminate avulsive injury. Note is made of a tiny os peroneus.  Small plantar calcaneal spur. Remaining joint spaces appear grossly preserved. No significant hallux valgus deformity. No definite erosions. Expected extensive soft tissue swelling about the forefoot. No radiopaque foreign body. IMPRESSION: 1. Comminuted, displaced  fractures involving the bases of the second and third metatarsals with intra-articular extension and apparent disruption of the Lisfranc joint. 2. Ossicle adjacent to the medial aspect of the base of the first metatarsal likely represents the sequela of age-indeterminate (though potentially acute) avulsive injury. Electronically Signed   By: Sandi Mariscal M.D.   On: 06/15/2018 15:00    ____________________________________________   PROCEDURES  Procedure(s) performed: None  Procedures  Critical Care performed: No  ____________________________________________   INITIAL IMPRESSION / ASSESSMENT AND PLAN / ED COURSE  Pertinent labs & imaging results that were available during my care  of the patient were reviewed by me and considered in my medical decision making (see chart for details).  Patient returns for evaluation of primarily left foot pain.  Also been feeling lightheaded, and had low blood pressure last night.  Very reassuring clinical examination at this time with reassuring neurologic exam, but obviously has injury to the left foot.  No evidence of acute neurovascular compromise, but evidence of metatarsal fracture Lisfranc.  CT head reviewed due to episodic lightheadedness, evaluate make sure no signs of a small stroke or traumatic injury from his falls.  Suspecting further clinical history taking that his low blood pressure may be related to having run out of Midrin which the report he recently ran out of.  Will provide here, check orthostatics which were completed reassuring with just some very slight drop in blood pressure with standing, but asymptomatic.  No chest pain or trouble breathing.  Also suspect some slight dehydration.  Clinical Course as of Jun 15 1849  Thu Jun 15, 2018  1602 Dr. Posey Pronto advises consult to podiatry.    [MQ]  1611 Dr. Vickki Muff recommends immobilize, ice and close outpatient follow-up with podiatry. Short leg posterior. Non weight bearing. Elevate as able.  Follow-up in clinic, call tomorrow and can see early next week.    [MQ]  1729 Post splint evaluation, left leg able to wiggle all toes.  Denies numbness in any of the toes or across the top of the foot.  Reports pain is well controlled.  He is resting comfortably discussed with family and they report I now recall that they think he had run out of his Midrin blood pressure prescription.  In hindsight, I am suspicious that his low blood pressures may have been attributed to his running out of Midrin recently.  Family reports they would like this refilled and he supposed to take 10 mg 3 times a day as previously prescribed by Dr. Caryn Section.   [MQ]    Clinical Course User Index [MQ] Delman Kitten, MD  ----------------------------------------- 6:50 PM on 06/15/2018 -----------------------------------------  Will discharge.  Provided prescription for management.  Patient will follow-up with Dr. Caryn Section and Dr. Vickki Muff.  Plan to see Dr. Vickki Muff early the next week.  Careful discharge instructions and treatment plan reviewed with patient as well as his mother.  They have assistive device at home including additional assistance including family who be able to help him with mobility.   ____________________________________________   FINAL CLINICAL IMPRESSION(S) / ED DIAGNOSES  Final diagnoses:  Closed displaced fracture of metatarsal bone of left foot, unspecified metatarsal, initial encounter  Near syncope      NEW MEDICATIONS STARTED DURING THIS VISIT:  Current Discharge Medication List       Note:  This document was prepared using Dragon voice recognition software and may include unintentional dictation errors.     Delman Kitten, MD 06/15/18 [phone]

## 2018-06-15 NOTE — ED Triage Notes (Signed)
Pt comes into the ED via POV c/o a fall last night.  Patient states he had a moment of almost passing out last night and he fell and hurt his left foot.  Patient has since then continued to fall and re injure the foot.  Patient presents with swollen and bruised foot.  Patient states he has moments of dizziness and feeling lightheaded.  Patient has even and unlabored respirations at this time.

## 2018-06-16 ENCOUNTER — Other Ambulatory Visit: Payer: Self-pay | Admitting: Family Medicine

## 2018-06-16 ENCOUNTER — Telehealth: Payer: Self-pay

## 2018-06-16 MED ORDER — FLUDROCORTISONE ACETATE 0.1 MG PO TABS: 100.0000 ug | ORAL_TABLET | Freq: Two times a day (BID) | ORAL | 2 refills | Status: DC

## 2018-06-16 NOTE — Telephone Encounter (Signed)
This medication is not listed as an active medication in patients chart. Please advise.

## 2018-06-16 NOTE — Telephone Encounter (Signed)
Caretaker called stating pt has been dizzy and falling. She is is requesting for a refill fludrocortisone 0.1 mg BID. She states she took him off of this about 3 months ago, but she now sees he needs this medication. [employer].

## 2018-06-18 ENCOUNTER — Other Ambulatory Visit: Payer: Self-pay

## 2018-06-18 ENCOUNTER — Encounter: Payer: Self-pay | Admitting: Emergency Medicine

## 2018-06-18 ENCOUNTER — Emergency Department
Admission: EM | Admit: 2018-06-18 | Discharge: 2018-06-18 | Disposition: A | Discharge status: Home / Self Care | Payer: Medicare HMO | Attending: Emergency Medicine | Admitting: Emergency Medicine

## 2018-06-18 DIAGNOSIS — Z87891 Personal history of nicotine dependence: Secondary | ICD-10-CM | POA: Insufficient documentation

## 2018-06-18 DIAGNOSIS — Y929 Unspecified place or not applicable: Secondary | ICD-10-CM | POA: Insufficient documentation

## 2018-06-18 DIAGNOSIS — S90822A Blister (nonthermal), left foot, initial encounter: Secondary | ICD-10-CM | POA: Insufficient documentation

## 2018-06-18 DIAGNOSIS — Y939 Activity, unspecified: Secondary | ICD-10-CM | POA: Insufficient documentation

## 2018-06-18 DIAGNOSIS — Y999 Unspecified external cause status: Secondary | ICD-10-CM | POA: Diagnosis not present

## 2018-06-18 DIAGNOSIS — S93621A Sprain of tarsometatarsal ligament of right foot, initial encounter: Secondary | ICD-10-CM | POA: Diagnosis not present

## 2018-06-18 DIAGNOSIS — S93622D Sprain of tarsometatarsal ligament of left foot, subsequent encounter: Secondary | ICD-10-CM | POA: Diagnosis not present

## 2018-06-18 DIAGNOSIS — Z79899 Other long term (current) drug therapy: Secondary | ICD-10-CM | POA: Diagnosis not present

## 2018-06-18 DIAGNOSIS — X58XXXA Exposure to other specified factors, initial encounter: Secondary | ICD-10-CM | POA: Insufficient documentation

## 2018-06-18 DIAGNOSIS — N183 Chronic kidney disease, stage 3 (moderate): Secondary | ICD-10-CM | POA: Diagnosis not present

## 2018-06-18 MED ORDER — HYDROCODONE-ACETAMINOPHEN 5-325 MG PO TABS: 1.0000 | ORAL_TABLET | Freq: Three times a day (TID) | ORAL | 0 refills | Status: AC | PRN

## 2018-06-18 MED ORDER — HYDROCODONE-ACETAMINOPHEN 5-325 MG PO TABS
1.0000 | ORAL_TABLET | Freq: Once | ORAL | Status: AC
  Administered 2018-06-18: 1 via ORAL
  Filled 2018-06-18: qty 1

## 2018-06-18 NOTE — Discharge Instructions (Addendum)
You have what are know as fracture blisters. They are common after foot fractures. You should keep the blisters clean, covered, and intact. Do NOT attempt to bust or pop the blisters, this will cause infection. Rest with the foot elevated when seated. Follow-up with Dr. Vickki Muff tomorrow as scheduled.

## 2018-06-18 NOTE — ED Triage Notes (Signed)
Pt c/o large blood blister to top of foot. Pt has fracture to left foot and was seen here on Friday and has splint in place.  Circulation is intact. Pt states he has not had any problems with the splint or the foot and are only concerned about the large blood blister.

## 2018-06-18 NOTE — ED Provider Notes (Signed)
[facility] Emergency Department Provider Note ____________________________________________  Time seen: 1225  I have reviewed the triage vital signs and the nursing notes.  HISTORY  Chief Complaint  Blister  HPI [name] is a 67 y.o. male presents to the ED for evaluation of a blister noted to the top of the foot this morning.  Patient was seen in the ED 2 days prior after a mechanical fall and some hypotension.  He was confirmed to have a Lisfranc fracture on the left foot.  He was appropriately splinted and discharged to follow-up with podiatry on Monday.  Patient noted today development of a large hemorrhagic blister to the top of the left foot.  He presents today for further evaluation.  He denies any fevers, chills, sweats but he has been resting with the foot elevated and has been nonweightbearing as directed.  Past Medical History:  Diagnosis Date  . Chicken pox   . Dizziness   . Measles   . Mumps   . Schizophrenia [facility])     Patient Active Problem List   Diagnosis Date Noted  . Cholecystitis 05/18/2016  . Chronic nausea 05/17/2016  . Hypoalbuminemia 05/14/2016  . Anemia 05/11/2016  . Hypotension 05/04/2016  . SBO (small bowel obstruction) ([place]) 03/18/2016  . Dizziness 05/16/2015  . Edema 05/15/2015  . Venous stasis dermatitis 05/15/2015  . History of adenomatous polyp of colon 12/01/2009  . Schizophrenia, simple, chronic ([place]) 08/21/2009  . CKD (chronic kidney disease) stage 3, GFR 30-59 ml/min ([facility]CC) 08/20/2009  . Hyperlipemia 08/19/2009    Past Surgical History:  Procedure Laterality Date  . ABDOMINAL SURGERY    . COLON SURGERY    . LAPAROTOMY N/A 03/20/2016   Procedure: EXPLORATORY LAPAROTOMY with lysis of adhesions;  Surgeon: Jules Husbands, MD;  Location: [facility] ORS;  Service: General;  Laterality: N/A;  . None      Prior to Admission medications   Medication Sig Start Date End Date Taking? Authorizing Provider  fludrocortisone  (FLORINEF) 0.1 MG tablet TAKE 1 TABLET (100MCG TOTAL) BY MOUTH (TWO) TIMES DAILY 06/17/18   Birdie Sons, MD  HYDROcodone-acetaminophen (NORCO) 5-325 MG tablet Take 1 tablet by mouth 3 (three) times daily as needed for up to 3 days. 06/18/18 06/21/18  Kobi Mario, Dannielle Karvonen, PA-C  midodrine (PROAMATINE) 10 MG tablet TAKE 1 TABLET BY MOUTH 3 TIMES A DAY WITH MEALS 06/15/18   Delman Kitten, MD  OLANZapine (ZYPREXA) 20 MG tablet Take 1 tablet (20 mg total) by mouth at bedtime. 05/26/18   Ursula Alert, MD  PARoxetine (PAXIL) 10 MG tablet Take 1 tablet (10 mg total) by mouth daily. 05/26/18   Ursula Alert, MD  pravastatin (PRAVACHOL) 40 MG tablet  05/01/18   [provider]  risperidone (RISPERDAL) 4 MG tablet Take 1 tablet (4 mg total) by mouth at bedtime. 05/26/18   Ursula Alert, MD    Allergies Patient has no known allergies.  Family History  Problem Relation Age of Onset  . Diabetes Mother   . Heart disease Mother   . Stroke Mother   . Diabetes Father   . Hypertension Father   . Lung cancer Father   . Cancer Father        lung cancer  . Schizophrenia Sister   . Lung cancer Sister   . Colon cancer Sister     Social History Social History   Tobacco Use  . Smoking status: Former Smoker    Types: Cigarettes  Last attempt to quit: 08/04/2004    Years since quitting: 13.8  . Smokeless tobacco: Former Systems developer    Quit date: 08/04/2004  Substance Use Topics  . Alcohol use: No    Alcohol/week: 0.0 oz  . Drug use: No    Review of Systems  Constitutional: Negative for fever. Cardiovascular: Negative for chest pain. Respiratory: Negative for shortness of breath. Musculoskeletal: Negative for back pain. Skin: Negative for rash.  Left foot blister as above. Neurological: Negative for headaches, focal weakness or numbness. ____________________________________________  PHYSICAL EXAM:  VITAL SIGNS: ED Triage Vitals [06/18/18 1158]  Enc Vitals Group     BP 106/62      Pulse Rate 100     Resp 18     Temp 98.1 F (36.7 C)     Temp Source Oral     SpO2 96 %     Weight 233 lb (105.7 kg)     Height 6' (1.829 m)     Head Circumference      Peak Flow      Pain Score 6     Pain Loc      Pain Edu?      Excl. in [place]?     Constitutional: Alert and oriented. Well appearing and in no distress. Head: Normocephalic and atraumatic. Eyes: Conjunctivae are normal. Normal extraocular movements Cardiovascular: Normal rate, regular rhythm. Normal distal pulses. Respiratory: Normal respiratory effort. Musculoskeletal: Nontender with normal range of motion in all extremities.  Neurologic:  Normal gait without ataxia. Normal speech and language. No gross focal neurologic deficits are appreciated. Skin:  Skin is warm, dry and intact. No rash noted.  Left foot with obvious soft tissue swelling and some resolving ecchymosis patient with a large, tense, hemorrhagic cyst to the dorsum of the left foot overlying the MCPs.  Patient also has a smaller blister formation to the lateral aspect of the foot as well as a small serosanguineous, intact blister to the posterior Achilles.  No lymphangitis, warmth, or weeping is appreciated.  No skin desquamation is noted. ____________________________________________  PROCEDURES  Procedures Norco 5-325 mg PO Non-stick dressing to left foot ____________________________________________  INITIAL IMPRESSION / ASSESSMENT AND PLAN / ED COURSE  Patient with ED evaluation of development of a large cyst to the left foot with a history of a Lisfranc fracture.  Patient is family reassured to know that the fractures are common sequelae.  Related to traumatic injuries.  They are advised to keep the blisters intact and covered until they can be evaluated by podiatry tomorrow.  Patient's foot is dressed with nonstick gauze, and the posterior splint is again applied.  Patient is reminded to keep the foot elevated and to ambulate with nonweightbearing  gait.  He will follow-up as scheduled.  Patient is given a small prescription for hydrocodone for pain relief.  I reviewed the patient's prescription history over the last 12 months in the multi-state controlled substances database(s) that includes [place], [place], [place], [place], [place], [place], [place], [place], [place], [place], [place], [place], [place], and [place].  Results were notable for no recent prescriptions.  ____________________________________________  FINAL CLINICAL IMPRESSION(S) / ED DIAGNOSES  Final diagnoses:  Blister (nonthermal), left foot, initial encounter  Lisfranc's sprain, right, initial encounter      Melvenia Needles, PA-C 06/18/18 1314    Earleen Newport, MD 06/18/18 1343

## 2018-06-18 NOTE — ED Notes (Signed)
Fluid blister top of left foot. Foot warm, swollen.

## 2018-06-19 DIAGNOSIS — S93325A Dislocation of tarsometatarsal joint of left foot, initial encounter: Secondary | ICD-10-CM | POA: Diagnosis not present

## 2018-06-27 ENCOUNTER — Ambulatory Visit: Payer: Self-pay

## 2018-06-27 ENCOUNTER — Telehealth: Payer: Self-pay

## 2018-06-27 NOTE — Telephone Encounter (Signed)
Called to see about r/s cancelled AWV from today. [name] stated that pt is going through a lot with his foot right now and he may need sx. [name] did not want to commit to an apt at this time. Daneil Dan we would wait a month and CB and if she wants to schedule before then to call us. [name] stated understanding. Reminder note made.  -MM

## 2018-06-28 DIAGNOSIS — S93325D Dislocation of tarsometatarsal joint of left foot, subsequent encounter: Secondary | ICD-10-CM | POA: Diagnosis not present

## 2018-07-03 DIAGNOSIS — S93325D Dislocation of tarsometatarsal joint of left foot, subsequent encounter: Secondary | ICD-10-CM | POA: Diagnosis not present

## 2018-07-04 ENCOUNTER — Inpatient Hospital Stay: Admission: RE | Admit: 2018-07-04 | Payer: Medicare HMO | Source: Ambulatory Visit

## 2018-07-07 ENCOUNTER — Ambulatory Visit: Admission: RE | Admit: 2018-07-07 | Payer: Medicare HMO | Source: Ambulatory Visit | Admitting: Podiatry

## 2018-07-07 ENCOUNTER — Encounter: Admission: RE | Payer: Self-pay | Source: Ambulatory Visit

## 2018-07-07 SURGERY — FUSION, JOINT, FOOT
Anesthesia: Choice | Laterality: Left

## 2018-07-25 NOTE — Telephone Encounter (Signed)
Spoke with [name] who states pt is still having trouble with his left foot. Pt has a f/u with Dr Vickki Muff on 07/30/18. [name] is going to see what is advised next and will CB to schedule if able to. Closing TE. -MM

## 2018-07-31 DIAGNOSIS — S93325D Dislocation of tarsometatarsal joint of left foot, subsequent encounter: Secondary | ICD-10-CM | POA: Diagnosis not present

## 2018-08-01 ENCOUNTER — Other Ambulatory Visit: Payer: Self-pay | Admitting: Family Medicine

## 2018-09-18 DIAGNOSIS — S93325D Dislocation of tarsometatarsal joint of left foot, subsequent encounter: Secondary | ICD-10-CM | POA: Diagnosis not present

## 2018-10-26 ENCOUNTER — Ambulatory Visit: Payer: Medicare HMO | Admitting: Psychiatry

## 2018-11-22 ENCOUNTER — Other Ambulatory Visit: Payer: Self-pay | Admitting: Psychiatry

## 2018-11-22 DIAGNOSIS — F200 Paranoid schizophrenia: Secondary | ICD-10-CM

## 2018-11-23 DIAGNOSIS — S63501A Unspecified sprain of right wrist, initial encounter: Secondary | ICD-10-CM | POA: Diagnosis not present

## 2018-11-24 ENCOUNTER — Ambulatory Visit: Payer: Medicare HMO | Admitting: Psychiatry

## 2018-11-29 ENCOUNTER — Other Ambulatory Visit: Payer: Self-pay | Admitting: Family Medicine

## 2018-11-29 MED ORDER — FLUDROCORTISONE ACETATE 0.1 MG PO TABS: ORAL_TABLET | ORAL | 3 refills | Status: DC

## 2018-11-29 NOTE — Telephone Encounter (Signed)
Pt's sister - [name] calling for pt needing 90 day supply for pt: fludrocortisone (FLORINEF) 0.1 MG tablet  Please fill at:     [facility] [place] [phone] (Phone) [phone] (Fax)   Pt's sister also wants a call back to discuss pt's medications.  Thanks, [employer]

## 2018-11-30 NOTE — Progress Notes (Signed)
[facility] OP Progress Note  12/01/2018 11:46 AM [name]  MRN:  [id number]  Chief Complaint: ' I am here for follow up.' Chief Complaint    Follow-up; Medication Refill     HPI: [name] is a 67 year old Caucasian male, divorced, lives in [place], has a history of schizophrenia, presented to the clinic today for a follow-up visit.  Patient presented along with his sister who is his healthcare power of attorney-Ms. [name].  Patient today reports his mood symptoms are stable.  He denies any perceptual disturbances.  He is compliant on his medications.  He denies any side effects.  He continues to have movements around his mouth- TD chronic.  He declines medications for the same.  He denies any other concerns today.   Visit Diagnosis:    ICD-10-CM   1. Paranoid schizophrenia ([place]) F20.0    in remission  2. Tardive dyskinesia G24.01     Past Psychiatric History: Reviewed past psychiatric history from my progress note on 02/23/2018.  Past trials of Haldol, Klonopin, Seroquel.  Past Medical History:  Past Medical History:  Diagnosis Date  . Chicken pox   . Dizziness   . Measles   . Mumps   . Schizophrenia [facility])     Past Surgical History:  Procedure Laterality Date  . ABDOMINAL SURGERY    . COLON SURGERY    . LAPAROTOMY N/A 03/20/2016   Procedure: EXPLORATORY LAPAROTOMY with lysis of adhesions;  Surgeon: Jules Husbands, MD;  Location: [facility] ORS;  Service: General;  Laterality: N/A;  . None      Family Psychiatric History: I have reviewed family psychiatric history from my progress note on 02/23/2018  Family History:  Family History  Problem Relation Age of Onset  . Diabetes Mother   . Heart disease Mother   . Stroke Mother   . Diabetes Father   . Hypertension Father   . Lung cancer Father   . Cancer Father        lung cancer  . Schizophrenia Sister   . Lung cancer Sister   . Colon cancer Sister     Social History: Reviewed social history from my  progress note on 02/23/2018 Social History   Socioeconomic History  . Marital status: Divorced    Spouse name: Not on file  . Number of children: 2  . Years of education: Not on file  . Highest education level: High school graduate  Occupational History  . Occupation: Disabled  Social Needs  . Financial resource strain: Not hard at all  . Food insecurity:    Worry: Never true    Inability: Never true  . Transportation needs:    Medical: No    Non-medical: No  Tobacco Use  . Smoking status: Former Smoker    Types: Cigarettes    Last attempt to quit: 08/04/2004    Years since quitting: 14.3  . Smokeless tobacco: Former Systems developer    Quit date: 08/04/2004  Substance and Sexual Activity  . Alcohol use: No    Alcohol/week: 0.0 standard drinks  . Drug use: No  . Sexual activity: Not Currently  Lifestyle  . Physical activity:    Days per week: 0 days    Minutes per session: 0 min  . Stress: Not on file  Relationships  . Social connections:    Talks on phone: More than three times a week    Gets together: More than three times a week    Attends religious service:  More than 4 times per year    Active member of club or organization: No    Attends meetings of clubs or organizations: Never    Relationship status: Divorced  Other Topics Concern  . Not on file  Social History Narrative  . Not on file    Allergies: No Known Allergies  Metabolic Disorder Labs: No results found for: HGBA1C, MPG No results found for: PROLACTIN Lab Results  Component Value Date   CHOL 235 (H) 02/16/2017   TRIG 275 (H) 02/16/2017   HDL 38 (L) 02/16/2017   CHOLHDL 6.2 (H) 02/16/2017   LDLCALC 142 (H) 02/16/2017   LDLCALC 108 02/21/2012   Lab Results  Component Value Date   TSH 1.333 05/04/2016   TSH 2.01 08/13/2011    Therapeutic Level Labs: No results found for: LITHIUM No results found for: VALPROATE No components found for:  CBMZ  Current Medications: Current Outpatient Medications   Medication Sig Dispense Refill  . fludrocortisone (FLORINEF) 0.1 MG tablet TAKE 1 TABLET (100MCG TOTAL) BY MOUTH (TWO) TIMES DAILY 180 tablet 3  . midodrine (PROAMATINE) 10 MG tablet TAKE 1 TABLET BY MOUTH 3 TIMES A DAY WITH MEALS 90 tablet 0  . OLANZapine (ZYPREXA) 20 MG tablet Take 1 tablet (20 mg total) by mouth at bedtime. 90 tablet 1  . PARoxetine (PAXIL) 10 MG tablet Take 1 tablet (10 mg total) by mouth daily. 90 tablet 1  . pravastatin (PRAVACHOL) 40 MG tablet TAKE 1 TABLET BY MOUTH EVERY DAY 30 tablet 12  . risperidone (RISPERDAL) 4 MG tablet Take 1 tablet (4 mg total) by mouth at bedtime. 90 tablet 1   No current facility-administered medications for this visit.      Musculoskeletal: Strength & Muscle Tone: within normal limits Gait & Station: normal Patient leans: N/A  Psychiatric Specialty Exam: Review of Systems  Psychiatric/Behavioral: Negative for depression. The patient is not nervous/anxious.   All other systems reviewed and are negative.   Blood pressure 126/63, pulse 92, temperature 98.5 F (36.9 C), temperature source Oral, weight 240 lb 3.2 oz (109 kg).Body mass index is 32.58 kg/m.  General Appearance: Casual  Eye Contact:  Fair  Speech:  Clear and Coherent  Volume:  Normal  Mood:  Euthymic  Affect:  Congruent  Thought Process:  Goal Directed and Descriptions of Associations: Intact  Orientation:  Full (Time, Place, and Person)  Thought Content: Logical   Suicidal Thoughts:  No  Homicidal Thoughts:  No  Memory:  Immediate;   Fair Recent;   Fair Remote;   Fair  Judgement:  Fair  Insight:  Fair  Psychomotor Activity:  Normal  Concentration:  Concentration: Fair and Attention Span: Fair  Recall:  [employer] of Knowledge: Fair  Language: Fair  Akathisia:  No  Handed:  Right  AIMS (if indicated):has TD , movements around his mouth - 3  Assets:  Communication Skills Desire for Improvement Social Support  ADL's:  Intact  Cognition: WNL  Sleep:   Fair   Screenings: AIMS     Office Visit from 02/23/2018 in [facility] Total Score  3    PHQ2-9     Office Visit from 02/16/2017 in [facility]  PHQ-2 Total Score  0  PHQ-9 Total Score  0       Assessment and Plan: [name] is a 67 year old Caucasian male, divorced, lives in [place], on [place], has a history of paranoid schizophrenia, TD, presented to the clinic today  for a follow-up visit.  He has a chronic history of mental health problems and is currently compliant with his medications as prescribed.  Patient is biologically predisposed given his extensive family history of mental health problems.  He however continues to have good social support from his sister who lives with him.  She is also the power of attorney for his healthcare.  Patient wants to be continued on the current medication regimen .  Plan Schizophrenia Zyprexa 10 mg p.o. nightly Risperidone 4 mg p.o. nightly Paxil 10 mg p.o. daily  For tardive dyskinesia Aims equals 3 He continues to have movements around his mouth.  Patient provided medication education, discussed the risk of being on 2 antipsychotic medications.  Patient however reports he wants to be continued on the current medication regimen , he did not tolerate previous trials to taper it off.  Follow-up in clinic in 6 months or sooner if needed.  More than 50 % of the time was spent for psychoeducation and supportive psychotherapy and care coordination.  This note was generated in part or whole with voice recognition software. Voice recognition is usually quite accurate but there are transcription errors that can and very often do occur. I apologize for any typographical errors that were not detected and corrected.       Ursula Alert, MD 12/01/2018, 11:46 AM

## 2018-12-01 ENCOUNTER — Other Ambulatory Visit: Payer: Self-pay

## 2018-12-01 ENCOUNTER — Encounter: Payer: Self-pay | Admitting: Psychiatry

## 2018-12-01 ENCOUNTER — Ambulatory Visit: Payer: Medicare HMO | Admitting: Psychiatry

## 2018-12-01 VITALS — BP 126/63 | HR 92 | Temp 98.5°F | Wt 240.2 lb

## 2018-12-01 DIAGNOSIS — G2401 Drug induced subacute dyskinesia: Secondary | ICD-10-CM | POA: Diagnosis not present

## 2018-12-01 DIAGNOSIS — F200 Paranoid schizophrenia: Secondary | ICD-10-CM | POA: Diagnosis not present

## 2018-12-01 MED ORDER — PAROXETINE HCL 10 MG PO TABS: 10.0000 mg | ORAL_TABLET | Freq: Every day | ORAL | 1 refills | Status: DC

## 2018-12-01 MED ORDER — OLANZAPINE 20 MG PO TABS: 20.0000 mg | ORAL_TABLET | Freq: Every day | ORAL | 1 refills | Status: DC

## 2018-12-01 MED ORDER — RISPERIDONE 4 MG PO TABS: 4.0000 mg | ORAL_TABLET | Freq: Every day | ORAL | 1 refills | Status: DC

## 2019-01-23 ENCOUNTER — Encounter: Payer: Self-pay | Admitting: Emergency Medicine

## 2019-01-23 ENCOUNTER — Inpatient Hospital Stay
Admission: EM | Admit: 2019-01-23 | Discharge: 2019-01-26 | DRG: 389 | Disposition: A | Discharge status: Home / Self Care | Payer: Medicare Other | Attending: Surgery | Admitting: Surgery

## 2019-01-23 ENCOUNTER — Other Ambulatory Visit: Payer: Self-pay

## 2019-01-23 ENCOUNTER — Emergency Department: Payer: Medicare Other

## 2019-01-23 DIAGNOSIS — R42 Dizziness and giddiness: Secondary | ICD-10-CM | POA: Diagnosis not present

## 2019-01-23 DIAGNOSIS — K5651 Intestinal adhesions [bands], with partial obstruction: Secondary | ICD-10-CM | POA: Diagnosis not present

## 2019-01-23 DIAGNOSIS — R140 Abdominal distension (gaseous): Secondary | ICD-10-CM | POA: Diagnosis not present

## 2019-01-23 DIAGNOSIS — Z4659 Encounter for fitting and adjustment of other gastrointestinal appliance and device: Secondary | ICD-10-CM

## 2019-01-23 DIAGNOSIS — I959 Hypotension, unspecified: Secondary | ICD-10-CM | POA: Diagnosis present

## 2019-01-23 DIAGNOSIS — I10 Essential (primary) hypertension: Secondary | ICD-10-CM | POA: Diagnosis present

## 2019-01-23 DIAGNOSIS — K4020 Bilateral inguinal hernia, without obstruction or gangrene, not specified as recurrent: Secondary | ICD-10-CM | POA: Diagnosis present

## 2019-01-23 DIAGNOSIS — K56609 Unspecified intestinal obstruction, unspecified as to partial versus complete obstruction: Secondary | ICD-10-CM | POA: Diagnosis not present

## 2019-01-23 DIAGNOSIS — E876 Hypokalemia: Secondary | ICD-10-CM | POA: Diagnosis present

## 2019-01-23 DIAGNOSIS — Z87891 Personal history of nicotine dependence: Secondary | ICD-10-CM | POA: Diagnosis not present

## 2019-01-23 DIAGNOSIS — N179 Acute kidney failure, unspecified: Secondary | ICD-10-CM | POA: Diagnosis present

## 2019-01-23 DIAGNOSIS — Z4682 Encounter for fitting and adjustment of non-vascular catheter: Secondary | ICD-10-CM | POA: Diagnosis not present

## 2019-01-23 DIAGNOSIS — Z818 Family history of other mental and behavioral disorders: Secondary | ICD-10-CM

## 2019-01-23 DIAGNOSIS — R110 Nausea: Secondary | ICD-10-CM

## 2019-01-23 DIAGNOSIS — Z79899 Other long term (current) drug therapy: Secondary | ICD-10-CM | POA: Diagnosis not present

## 2019-01-23 DIAGNOSIS — F209 Schizophrenia, unspecified: Secondary | ICD-10-CM | POA: Diagnosis present

## 2019-01-23 DIAGNOSIS — R1084 Generalized abdominal pain: Secondary | ICD-10-CM

## 2019-01-23 DIAGNOSIS — R112 Nausea with vomiting, unspecified: Secondary | ICD-10-CM | POA: Diagnosis not present

## 2019-01-23 DIAGNOSIS — R1110 Vomiting, unspecified: Secondary | ICD-10-CM | POA: Diagnosis not present

## 2019-01-23 LAB — COMPREHENSIVE METABOLIC PANEL
ALK PHOS: 91 U/L (ref 38–126)
ALT: 25 U/L (ref 0–44)
AST: 29 U/L (ref 15–41)
Albumin: 3.8 g/dL (ref 3.5–5.0)
Anion gap: 11 (ref 5–15)
BILIRUBIN TOTAL: 1.1 mg/dL (ref 0.3–1.2)
BUN: 30 mg/dL — AB (ref 8–23)
CALCIUM: 8.6 mg/dL — AB (ref 8.9–10.3)
CO2: 22 mmol/L (ref 22–32)
CREATININE: 1.97 mg/dL — AB (ref 0.61–1.24)
Chloride: 105 mmol/L (ref 98–111)
GFR, EST AFRICAN AMERICAN: 39 mL/min — AB (ref >60)
GFR, EST NON AFRICAN AMERICAN: 34 mL/min — AB (ref >60)
Glucose, Bld: 101 mg/dL — ABNORMAL HIGH (ref 70–99)
Potassium: 4.4 mmol/L (ref 3.5–5.1)
Sodium: 138 mmol/L (ref 135–145)
TOTAL PROTEIN: 7.1 g/dL (ref 6.5–8.1)

## 2019-01-23 LAB — CBC
HEMATOCRIT: 43.5 % (ref 39.0–52.0)
HEMOGLOBIN: 13.7 g/dL (ref 13.0–17.0)
MCH: 27.3 pg (ref 26.0–34.0)
MCHC: 31.5 g/dL (ref 30.0–36.0)
MCV: 86.8 fL (ref 80.0–100.0)
Platelets: 207 10*3/uL (ref 150–400)
RBC: 5.01 MIL/uL (ref 4.22–5.81)
RDW: 14.1 % (ref 11.5–15.5)
WBC: 11.1 10*3/uL — ABNORMAL HIGH (ref 4.0–10.5)
nRBC: 0 % (ref 0.0–0.2)

## 2019-01-23 LAB — TROPONIN I

## 2019-01-23 MED ORDER — PANTOPRAZOLE SODIUM 40 MG IV SOLR
40.0000 mg | Freq: Every day | INTRAVENOUS | Status: DC
  Administered 2019-01-23 – 2019-01-25 (×3): 40 mg via INTRAVENOUS
  Filled 2019-01-23 (×3): qty 40

## 2019-01-23 MED ORDER — SODIUM CHLORIDE 0.9 % IV BOLUS: 1000.0000 mL | Freq: Once | INTRAVENOUS | Status: DC

## 2019-01-23 MED ORDER — PROCHLORPERAZINE EDISYLATE 10 MG/2ML IJ SOLN
5.0000 mg | Freq: Four times a day (QID) | INTRAMUSCULAR | Status: DC | PRN
  Filled 2019-01-23: qty 2

## 2019-01-23 MED ORDER — RISPERIDONE 3 MG PO TABS
4.0000 mg | ORAL_TABLET | Freq: Every day | ORAL | Status: DC
  Administered 2019-01-23 – 2019-01-25 (×3): 4 mg via ORAL
  Filled 2019-01-23 (×4): qty 1

## 2019-01-23 MED ORDER — SODIUM CHLORIDE 0.9 % IV BOLUS
1000.0000 mL | Freq: Once | INTRAVENOUS | Status: AC
Start: 2019-01-23 — End: 2019-01-23
  Administered 2019-01-23: 1000 mL via INTRAVENOUS

## 2019-01-23 MED ORDER — KETOROLAC TROMETHAMINE 15 MG/ML IJ SOLN: 15.0000 mg | Freq: Four times a day (QID) | INTRAMUSCULAR | Status: DC | PRN

## 2019-01-23 MED ORDER — IOHEXOL 300 MG/ML  SOLN
75.0000 mL | Freq: Once | INTRAMUSCULAR | Status: AC | PRN
  Administered 2019-01-23: 75 mL via INTRAVENOUS

## 2019-01-23 MED ORDER — DEXTROSE IN LACTATED RINGERS 5 % IV SOLN
INTRAVENOUS | Status: DC
  Administered 2019-01-23 – 2019-01-25 (×6): via INTRAVENOUS

## 2019-01-23 MED ORDER — PROCHLORPERAZINE MALEATE 10 MG PO TABS
10.0000 mg | ORAL_TABLET | Freq: Four times a day (QID) | ORAL | Status: DC | PRN
  Administered 2019-01-24: 10 mg via ORAL
  Filled 2019-01-23 (×2): qty 1

## 2019-01-23 MED ORDER — HEPARIN SODIUM (PORCINE) 5000 UNIT/ML IJ SOLN
5000.0000 [IU] | Freq: Three times a day (TID) | INTRAMUSCULAR | Status: DC
  Administered 2019-01-23 – 2019-01-26 (×8): 5000 [IU] via SUBCUTANEOUS
  Filled 2019-01-23 (×8): qty 1

## 2019-01-23 MED ORDER — MORPHINE SULFATE (PF) 2 MG/ML IV SOLN: 2.0000 mg | INTRAVENOUS | Status: DC | PRN

## 2019-01-23 MED ORDER — ONDANSETRON HCL 4 MG/2ML IJ SOLN: 4.0000 mg | Freq: Four times a day (QID) | INTRAMUSCULAR | Status: DC | PRN

## 2019-01-23 MED ORDER — ONDANSETRON 4 MG PO TBDP: 4.0000 mg | ORAL_TABLET | Freq: Four times a day (QID) | ORAL | Status: DC | PRN

## 2019-01-23 MED ORDER — FLUDROCORTISONE ACETATE 0.1 MG PO TABS
0.1000 mg | ORAL_TABLET | Freq: Two times a day (BID) | ORAL | Status: DC
  Administered 2019-01-23 – 2019-01-25 (×4): 0.1 mg via ORAL
  Filled 2019-01-23 (×4): qty 1

## 2019-01-23 NOTE — ED Notes (Addendum)
Bloodwork ordered so CT can proceed with scan. Blood samples sent to lab earlier.

## 2019-01-23 NOTE — ED Provider Notes (Addendum)
Signout from Dr. Mariea Clonts in the 68 year old male with a history of small bowel obstruction who is presenting with vomiting as well as not passing any gas after having a bowel movement this morning.  Also complaining of abdominal distention.  Concern is for small bowel obstruction.  Pending CAT scan.  Physical Exam  BP 132/87 (BP Location: Left Arm)   Pulse 72   Temp 98.3 F (36.8 C) (Oral)   Resp 15   SpO2 98%   Physical Exam Patient without any distress at this time.  Mildly distended abdomen with diffuse and mild tenderness to palpation. ED Course/Procedures     Procedures  MDM  ----------------------------------------- 4:20 PM on 01/23/2019 -----------------------------------------  CAT scan showing small bowel obstruction.  Patient aware of diagnosis as well as need for admission to the hospital.  Discussed case with Dr. Dahlia Byes of surgery who will be admitting the patient.  ED ECG REPORT I, Doran Stabler, the attending physician, personally viewed and interpreted this ECG.   Date: 01/23/2019  EKG Time: 1702  Rate: 70  Rhythm: normal sinus rhythm  Axis: Normal  Intervals:nonspecific intraventricular conduction delay  ST&T Change: No ST segment elevation or depression but no abnormal T wave inversion.  I do not see any significant ST depression in the inferior leads as read by the EKG machine.      Orbie Pyo, MD 01/23/19 1620    Orbie Pyo, MD 01/23/19 [phone]

## 2019-01-23 NOTE — ED Notes (Signed)
Family at bedside. Surgeon @ [facility].

## 2019-01-23 NOTE — ED Provider Notes (Addendum)
[facility] Emergency Department Provider Note  ____________________________________________  Time seen: Approximately 1:28 PM  I have reviewed the triage vital signs and the nursing notes.   HISTORY  Chief Complaint Abdominal Pain    HPI [name] is a 68 y.o. male w/ a hx of obstruction w/ ex-lap for lysis of adhesions 4/17 presenting w/ nausea and abdominal pain.  The pt reports that he ate a meal of chili beans/steak/sausage and approximately 3 hours later began to develop diffuse abdominal discomfort associated with nausea.  He forced himself to throw up 3 separate times.  His symptoms improved after vomiting.  When he was emptying his trash can into the commode, he began to feel dizzy and fell backwards but did not lose consciousness.  He did not injure himself with the fall.  He has chronic constipation and took his normal stool softeners with a normal bowel movement this morning.  He does report abdominal distention with resolution of abdominal pain.  The patient has not had any fevers or chills, cough or cold symptoms.  Upon arrival to the emergency department, the patient's blood pressure was 74/46; on my exam without intervention the patient was normotensive.   Past Medical History:  Diagnosis Date  . Chicken pox   . Dizziness   . Measles   . Mumps   . Schizophrenia [facility])     Patient Active Problem List   Diagnosis Date Noted  . Cholecystitis 05/18/2016  . Chronic nausea 05/17/2016  . Hypoalbuminemia 05/14/2016  . Anemia 05/11/2016  . Hypotension 05/04/2016  . SBO (small bowel obstruction) ([place]) 03/18/2016  . Dizziness 05/16/2015  . Edema 05/15/2015  . Venous stasis dermatitis 05/15/2015  . History of adenomatous polyp of colon 12/01/2009  . Schizophrenia, simple, chronic ([place]) 08/21/2009  . CKD (chronic kidney disease) stage 3, GFR 30-59 ml/min ([facility]) 08/20/2009  . Hyperlipemia 08/19/2009    Past Surgical History:  Procedure  Laterality Date  . ABDOMINAL SURGERY    . COLON SURGERY    . LAPAROTOMY N/A 03/20/2016   Procedure: EXPLORATORY LAPAROTOMY with lysis of adhesions;  Surgeon: Jules Husbands, MD;  Location: [facility] ORS;  Service: General;  Laterality: N/A;  . None      Current Outpatient Rx  . Order #: [id number] Class: Normal  . Order #: [id number] Class: Print  . Order #: [id number] Class: Normal  . Order #: [id number] Class: Normal  . Order #: [id number] Class: Normal  . Order #: [id number] Class: Normal    Allergies Patient has no known allergies.  Family History  Problem Relation Age of Onset  . Diabetes Mother   . Heart disease Mother   . Stroke Mother   . Diabetes Father   . Hypertension Father   . Lung cancer Father   . Cancer Father        lung cancer  . Schizophrenia Sister   . Lung cancer Sister   . Colon cancer Sister     Social History Social History   Tobacco Use  . Smoking status: Former Smoker    Types: Cigarettes    Last attempt to quit: 08/04/2004    Years since quitting: 14.4  . Smokeless tobacco: Former Systems developer    Quit date: 08/04/2004  Substance Use Topics  . Alcohol use: No    Alcohol/week: 0.0 standard drinks  . Drug use: No    Review of Systems Constitutional: No fever/chills. Eyes: No visual changes. ENT: No sore throat. No congestion or rhinorrhea. Cardiovascular: Denies chest pain. Denies  palpitations. Respiratory: Denies shortness of breath.  No cough. Gastrointestinal: Positive abdominal distention and diffuse abdominal pain.  Positive nausea with forced vomiting.  No spontaneous vomiting.  No diarrhea.  No constipation.  Normal bowel movement today. Genitourinary: Negative for dysuria. Musculoskeletal: Negative for back pain. Skin: Negative for rash. Neurological: Negative for headaches. No focal numbness, tingling or weakness.     ____________________________________________   PHYSICAL EXAM:  VITAL SIGNS: ED Triage Vitals  Enc Vitals Group     BP  01/23/19 1248 (!) 74/46     Pulse Rate 01/23/19 1248 94     Resp 01/23/19 1248 16     Temp 01/23/19 1248 98.3 F (36.8 C)     Temp Source 01/23/19 1248 Oral     SpO2 01/23/19 1248 96 %     Weight --      Height --      Head Circumference --      Peak Flow --      Pain Score 01/23/19 1249 5     Pain Loc --      Pain Edu? --      Excl. in [place]? --     Constitutional: Alert and oriented. Answers questions appropriately. Eyes: Conjunctivae are normal.  EOMI. No scleral icterus. Head: Atraumatic. Nose: No congestion/rhinnorhea. Mouth/Throat: Mucous membranes are moist.  Neck: No stridor.  Supple. No JVD.  Cardiovascular: Normal rate, regular rhythm. No murmurs, rubs or gallops.  Respiratory: Normal respiratory effort.  No accessory muscle use or retractions. Lungs CTAB.  No wheezes, rales or ronchi. Gastrointestinal: Soft, and nondistended.+ diffuse distention w/o fluid wave.  No reproducible ttp.  No guarding or rebound.  No peritoneal signs. Musculoskeletal: No LE edema.  Neurologic:  A&Ox3.  Speech is clear.  Face and smile are symmetric.  EOMI.  Moves all extremities well. Skin:  Skin is warm, dry and intact. No rash noted. Psychiatric: Mood and affect are normal. Speech and behavior are normal.  Normal judgement.  ____________________________________________   LABS (all labs ordered are listed, but only abnormal results are displayed)  Labs Reviewed  CBC - Abnormal; Notable for the following components:      Result Value   WBC 11.1 (*)    All other components within normal limits  COMPREHENSIVE METABOLIC PANEL - Abnormal; Notable for the following components:   Glucose, Bld 101 (*)    BUN 30 (*)    Creatinine, Ser 1.97 (*)    Calcium 8.6 (*)    GFR calc non Af Amer 34 (*)    GFR calc Af Amer 39 (*)    All other components within normal limits   ____________________________________________  EKG  Not  indicated ____________________________________________  RADIOLOGY  No results found.  ____________________________________________   PROCEDURES  Procedure(s) performed: None  Procedures  Critical Care performed: No ____________________________________________   INITIAL IMPRESSION / ASSESSMENT AND PLAN / ED COURSE  Pertinent labs & imaging results that were available during my care of the patient were reviewed by me and considered in my medical decision making (see chart for details).  68 y.o. male with a prior history of ex lap for lysis of adhesions and obstruction presenting with nausea and forced vomiting, after a meal of chili steak and sausage, diffuse abdominal pain which has now resolved, and abdominal distention which is ongoing; normal bowel movements.  Overall, the patient's initial blood pressure was hypotensive but without intervention he has had normotensive pressures.  We will get orthostatics; I would consider hypovolemia  as the most likely cause.  This may also be why the patient had a dizzy spell.  The patient has significant risk for an intra-abdominal pathology so CT scan has been ordered.  It is also possible he also developed a foodborne or food related illness.  At this time, the patient does not have any pain or nausea, so treat him with fluids, and await the results of his CT scan for final disposition.  ----------------------------------------- 3:17 PM on 01/23/2019 -----------------------------------------  The patient continues to be hemodynamically stable and asymptomatic.  His CT scan is still pending and has been signed out to Dr. Clearnce Hasten for final disposition.  ____________________________________________  FINAL CLINICAL IMPRESSION(S) / ED DIAGNOSES  Final diagnoses:  Nausea  Abdominal distention  Generalized abdominal pain         NEW MEDICATIONS STARTED DURING THIS VISIT:  New Prescriptions   No medications on file      Eula Listen, MD 01/23/19 1337    Eula Listen, MD 01/23/19 1516    Eula Listen, MD 01/23/19 1517

## 2019-01-23 NOTE — ED Triage Notes (Signed)
PT c/o abd pain and vomiting since yesterday. Pt states fatigue. Hx of hyponatremia , BP 74/46, PT A&OX4

## 2019-01-23 NOTE — Progress Notes (Signed)
Family Meeting Note patient has history of chronic schizophrenia and chronic dizziness. He comes in today with small bowel obstruction. He has some abdominal pain and nausea and vomiting. Found to be hypotensive initially however corrected after IV fluids. Code status discussed patient is a full code. Sister was present in the room. She is agreeable. Time spent 16 minutes. Fritzi Mandes, MD

## 2019-01-23 NOTE — ED Notes (Signed)
Rachel RN, aware of bed assigned  

## 2019-01-23 NOTE — Consult Note (Addendum)
[facility] [facility] [place] NAME: [name]    MR#:  [id number]  DATE OF BIRTH:  04-Feb-1951  DATE OF ADMISSION:  01/23/2019  PRIMARY CARE PHYSICIAN: Birdie Sons, MD   REQUESTING/REFERRING PHYSICIAN:  Dr Schaevitz/Dr Diego  CHIEF COMPLAINT:   Chief Complaint  Patient presents with  . Abdominal Pain    HISTORY OF PRESENT ILLNESS:  [name]  is a 68 y.o. male with a known history of chronic schizophrenia on multiple psych meds, chronic dizziness with workup done in the past by primary care physician essentially negative, history of intermittent hypotension on midodrine comes to the emergency room with nausea and abdominal pain. Patient reports he ate a meal of chili beans with steak and sausage approximately three hours ago and develop diffuse abdominal discomfort last night and had vomited three times. His symptoms improved after vomiting. When he was trying to empty his trash can into the commode he felt dizzy and felt like he was going to pass out but did not pass out completely. Did not fall or injure himself. He has chronic constipation has been having small bowel movements in the last when he reports was yesterday morning.  Having abdominal discomfort this morning and came to the emergency room. His first blood pressure was 74/46. Patient's blood pressures thereafter in the emergency room have been essentially stable and the last one during my evaluation was 151/70. He is in sinus rhythm heart rate in the 70s and sats are hundred percent  he denies any chest pain, shortness of breath, head injury, seizures. Patient sister is in the emergency room and agrees with above.  Patient was found to have small bowel obstruction on CT of the abdomen. Internal medicine will follow while patient is in house.  PAST MEDICAL HISTORY:   Past Medical History:  Diagnosis Date  . Chicken pox   . Dizziness   . Measles   . Mumps   .  Schizophrenia ([place])     PAST SURGICAL HISTOIRY:   Past Surgical History:  Procedure Laterality Date  . ABDOMINAL SURGERY    . COLON SURGERY    . LAPAROTOMY N/A 03/20/2016   Procedure: EXPLORATORY LAPAROTOMY with lysis of adhesions;  Surgeon: Jules Husbands, MD;  Location: [facility] ORS;  Service: General;  Laterality: N/A;  . None      SOCIAL HISTORY:   Social History   Tobacco Use  . Smoking status: Former Smoker    Types: Cigarettes    Last attempt to quit: 08/04/2004    Years since quitting: 14.4  . Smokeless tobacco: Former Systems developer    Quit date: 08/04/2004  Substance Use Topics  . Alcohol use: No    Alcohol/week: 0.0 standard drinks    FAMILY HISTORY:   Family History  Problem Relation Age of Onset  . Diabetes Mother   . Heart disease Mother   . Stroke Mother   . Diabetes Father   . Hypertension Father   . Lung cancer Father   . Cancer Father        lung cancer  . Schizophrenia Sister   . Lung cancer Sister   . Colon cancer Sister     DRUG ALLERGIES:   Allergies  Allergen Reactions  . Vitamin B12 Other (See Comments)    interacted with other medications     REVIEW OF SYSTEMS:   Review of Systems  Constitutional: Negative for chills, fever and weight loss.  HENT: Negative  for ear discharge, ear pain and nosebleeds.   Eyes: Negative for blurred vision, pain and discharge.  Respiratory: Negative for sputum production, shortness of breath, wheezing and stridor.   Cardiovascular: Negative for chest pain, palpitations, orthopnea and PND.  Gastrointestinal: Positive for abdominal pain and nausea. Negative for diarrhea and vomiting.  Genitourinary: Negative for frequency and urgency.  Musculoskeletal: Negative for back pain and joint pain.  Neurological: Positive for weakness. Negative for sensory change, speech change and focal weakness.  Psychiatric/Behavioral: Negative for depression and hallucinations. The patient is not nervous/anxious.       MEDICATIONS AT  HOME:   Prior to Admission medications   Medication Sig Start Date End Date Taking? Authorizing Provider  fludrocortisone (FLORINEF) 0.1 MG tablet TAKE 1 TABLET (100MCG TOTAL) BY MOUTH (TWO) TIMES DAILY Patient taking differently: Take 0.1 mg by mouth 2 (two) times daily.  11/29/18  Yes Birdie Sons, MD  midodrine (PROAMATINE) 10 MG tablet TAKE 1 TABLET BY MOUTH 3 TIMES A DAY WITH MEALS Patient taking differently: Take 10 mg by mouth 3 (three) times daily.  06/15/18  Yes Delman Kitten, MD  OLANZapine (ZYPREXA) 20 MG tablet Take 1 tablet (20 mg total) by mouth at bedtime. 12/01/18  Yes Ursula Alert, MD  PARoxetine (PAXIL) 10 MG tablet Take 1 tablet (10 mg total) by mouth daily. 12/01/18  Yes Eappen, Ria Clock, MD  pravastatin (PRAVACHOL) 40 MG tablet TAKE 1 TABLET BY MOUTH EVERY DAY Patient taking differently: Take 40 mg by mouth daily.  08/01/18  Yes Birdie Sons, MD  risperidone (RISPERDAL) 4 MG tablet Take 1 tablet (4 mg total) by mouth at bedtime. 12/01/18  Yes Ursula Alert, MD      VITAL SIGNS:  Blood pressure (!) 142/91, pulse 73, temperature 98.3 F (36.8 C), temperature source Oral, resp. rate 16, SpO2 96 %.  PHYSICAL EXAMINATION:  GENERAL:  68 y.o.-year-old patient lying in the bed with no acute distress.  EYES: Pupils equal, round, reactive to light and accommodation. No scleral icterus. Extraocular muscles intact.  HEENT: Head atraumatic, normocephalic. Oropharynx and nasopharynx clear.  NECK:  Supple, no jugular venous distention. No thyroid enlargement, no tenderness.  LUNGS: Normal breath sounds bilaterally, no wheezing, rales,rhonchi or crepitation. No use of accessory muscles of respiration.  CARDIOVASCULAR: S1, S2 normal. No murmurs, rubs, or gallops.  ABDOMEN: Soft, nontender, nondistended. Bowel sounds present. No organomegaly or mass.  EXTREMITIES: No pedal edema, cyanosis, or clubbing.  NEUROLOGIC: Cranial nerves II through XII are intact. Muscle strength 5/5  in all extremities. Sensation intact. Gait not checked.  PSYCHIATRIC: The patient is alert and oriented x 3.  SKIN: No obvious rash, lesion, or ulcer.   LABORATORY PANEL:   CBC Recent Labs  Lab 01/23/19 1438  WBC 11.1*  HGB 13.7  HCT 43.5  PLT 207   ------------------------------------------------------------------------------------------------------------------  Chemistries  Recent Labs  Lab 01/23/19 1438  NA 138  K 4.4  CL 105  CO2 22  GLUCOSE 101*  BUN 30*  CREATININE 1.97*  CALCIUM 8.6*  AST 29  ALT 25  ALKPHOS 91  BILITOT 1.1   ------------------------------------------------------------------------------------------------------------------  Cardiac Enzymes No results for input(s): TROPONINI in the last 168 hours. ------------------------------------------------------------------------------------------------------------------  RADIOLOGY:  Ct Abdomen Pelvis W Contrast  Result Date: 01/23/2019 CLINICAL DATA:  Generalized abdominal pain and vomiting since yesterday, hyponatremia EXAM: CT ABDOMEN AND PELVIS WITH CONTRAST TECHNIQUE: Multidetector CT imaging of the abdomen and pelvis was performed using the standard protocol following bolus administration of intravenous contrast.  Sagittal and coronal MPR images reconstructed from axial data set. CONTRAST:  9mL OMNIPAQUE IOHEXOL 300 MG/ML SOLN IV. No oral contrast. COMPARISON:  08/27/2017 FINDINGS: Lower chest: Minimal atelectasis at LEFT lung base Hepatobiliary: Contracted gallbladder.  Liver normal appearance Pancreas: Normal appearance Spleen: Normal appearance Adrenals/Urinary Tract: Adrenal glands, kidneys, ureters, and bladder normal appearance Stomach/Bowel: Normal appendix. Colon decompressed. Redundant sigmoid colon. Stomach unremarkable. Dilated proximal and decompressed distal small bowel loops compatible with small bowel obstruction. Small bowel stool sign at a bowel loop in the mid abdomen with an area of  transition from dilated to normal caliber small bowel in the upper RIGHT pelvis question adhesion. Minimal bowel wall thickening at site. Vascular/Lymphatic: Atherosclerotic calcifications aorta and iliac arteries. No adenopathy. Reproductive: Prostatic enlargement with gland measuring 5.6 x 4.1 cm image 86. Other: Small BILATERAL inguinal hernias containing fat. Scattered free intraperitoneal fluid. Question prior ventral hernia repair. No free air. Musculoskeletal: Bones demineralized. IMPRESSION: Small-bowel obstruction with transition zone in the upper RIGHT pelvis, favor adhesion, with mild bowel wall thickening seen at site. Scattered free fluid without free air/evidence of perforation. Prostatic enlargement. BILATERAL inguinal hernias containing fat. Electronically Signed   By: Lavonia Dana M.D.   On: 01/23/2019 15:22    EKG:   Orders placed or performed during the hospital encounter of 01/23/19  . EKG 12-Lead  . EKG 12-Lead  . ED EKG  . ED EKG    IMPRESSION AND PLAN:   [name]  is a 68 y.o. male with a known history of chronic schizophrenia on multiple psych meds, chronic dizziness with workup done in the past by primary care physician essentially negative, history of intermittent hypotension on midodrine comes to the emergency room with nausea and abdominal pain.  1. Episode of hypotension now resolved suspected due to abdominal pain and vomiting with mild dehydration -continue IV fluids -blood pressure currently is stable. I am holding midodrine and fludrocortisone for nw and cane be initiated if needed -patient has intermittent episodes of hypotension and has been prescribed midodrine  2. Small bowel obstruction -management per surgery  3. Acute renal failure secondary to dehydration from G.I. losses due to SBO -baseline creatinine around 1.4 -creatinine 1.97--- continue IV fluids--- follow-up metabolic panel -avoid nephrotoxins -patient sodium is within normal  limits  4. Chronic dizziness -patient has had these intermittent episodes of dizziness for a long time and has been worked up by PCP -will continue to monitor. Patient currently is symptom-free. -On repeated asking patients denies dizziness at present  5. Chronic schizophrenia resume psych meds  Discussed with Dr. Dahlia Byes. I will put a special request for nurses to call with all medical problems to the hospital call  pager.   All the records are reviewed and case discussed with Consulting provider. Management plans discussed with the patient, family and they are in agreement.  CODE STATUS: FULL  TOTAL TIME TAKING CARE OF THIS PATIENT: 50 minutes.    Fritzi Mandes M.D on 01/23/2019 at 5:34 PM  Between 7am to 6pm - Pager - [phone]  After 6pm go to [email or web address] - password [email or web address] [facility]  Office  [phone]  CC: Primary care Physician: Birdie Sons, MD

## 2019-01-23 NOTE — H&P (Signed)
Patient ID: [name], male   DOB: [birth year], 68 y.o.   MRN: [id number]  HPI [name] is a 68 y.o. male at the request of Floyd.  Game in with acute onset of abdominal pain nausea and vomiting.  The pain is diffusely, crampy and intermittent.  Moderate intensity.  Please note that now the pain has subsided.  He did have 3 episode of emesis that he self-induced.  He reports no flatus for the last 24 hours.  Of note he did have a similar episode of bowel obstruction requiring laparotomy 2 years ago.  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  The are adamant about no blood transfusion under no circumstances. He also reports significant dizziness hypotension episode and some orthostatic hypotension likely related to chronic hyponatremia.  Patient has had repetitive falls for the last few weeks.  Has any fevers no chills no weight loss. In the emergency room there was an hypotensive episode but that has resolved.  Significant psychiatric history. The scan of the abdomen/ pelvis showed some small bowel dilation. ( I have personally reviewed it) No evidence of pneumatosis no evidence of free air. There is bilateral inguinal hernias . I can is 11 his hemoglobin is 13.7.  His creatinine is 1.9 his BUN is 30. He Is accompanied by her sister who is the power of attorney. I have discussed the case in detail with Dr.Schaevitz and Dr. Posey Pronto   HPI  Past Medical History:  Diagnosis Date  . Chicken pox   . Dizziness   . Measles   . Mumps   . Schizophrenia [facility])     Past Surgical History:  Procedure Laterality Date  . ABDOMINAL SURGERY    . COLON SURGERY    . LAPAROTOMY N/A 03/20/2016   Procedure: EXPLORATORY LAPAROTOMY with lysis of adhesions;  Surgeon: Jules Husbands, MD;  Location: [facility] ORS;  Service: General;  Laterality: N/A;  . None      Family History  Problem Relation Age of Onset  . Diabetes Mother   . Heart disease Mother   . Stroke  Mother   . Diabetes Father   . Hypertension Father   . Lung cancer Father   . Cancer Father        lung cancer  . Schizophrenia Sister   . Lung cancer Sister   . Colon cancer Sister     Social History Social History   Tobacco Use  . Smoking status: Former Smoker    Types: Cigarettes    Last attempt to quit: 08/04/2004    Years since quitting: 14.4  . Smokeless tobacco: Former Systems developer    Quit date: 08/04/2004  Substance Use Topics  . Alcohol use: No    Alcohol/week: 0.0 standard drinks  . Drug use: No    Allergies  Allergen Reactions  . Vitamin B12 Other (See Comments)    interacted with other medications     Current Facility-Administered Medications  Medication Dose Route Frequency Provider Last Rate Last Dose  . sodium chloride 0.9 % bolus 1,000 mL  1,000 mL Intravenous Once Eula Listen, MD       Current Outpatient Medications  Medication Sig Dispense Refill  . fludrocortisone (FLORINEF) 0.1 MG tablet TAKE 1 TABLET (100MCG TOTAL) BY MOUTH (TWO) TIMES DAILY (Patient taking differently: Take 0.1 mg by mouth 2 (two) times daily. ) 180 tablet 3  . midodrine (PROAMATINE) 10 MG tablet TAKE 1 TABLET BY  MOUTH 3 TIMES A DAY WITH MEALS (Patient taking differently: Take 10 mg by mouth 3 (three) times daily. ) 90 tablet 0  . OLANZapine (ZYPREXA) 20 MG tablet Take 1 tablet (20 mg total) by mouth at bedtime. 90 tablet 1  . PARoxetine (PAXIL) 10 MG tablet Take 1 tablet (10 mg total) by mouth daily. 90 tablet 1  . pravastatin (PRAVACHOL) 40 MG tablet TAKE 1 TABLET BY MOUTH EVERY DAY (Patient taking differently: Take 40 mg by mouth daily. ) 30 tablet 12  . risperidone (RISPERDAL) 4 MG tablet Take 1 tablet (4 mg total) by mouth at bedtime. 90 tablet 1     Review of Systems Full ROS  was asked and was negative except for the information on the HPI  Physical Exam Blood pressure (!) 142/91, pulse 73, temperature 98.3 F (36.8 C), temperature source Oral, resp. rate 16, SpO2 96  %. CONSTITUTIONAL: NAD EYES: Pupils are equal, round, and reactive to light, Sclera are non-icteric. EARS, NOSE, MOUTH AND THROAT: The oropharynx is clear. The oral mucosa is pink and moist. Hearing is intact to voice. LYMPH NODES:  Lymph nodes in the neck are normal. RESPIRATORY:  Lungs are clear. There is normal respiratory effort, with equal breath sounds bilaterally, and without pathologic use of accessory muscles. CARDIOVASCULAR: Heart is regular without murmurs, gallops, or rubs. GI: The abdomen is  soft, nontender,mildlly distended.. There are no palpable masses. There is no hepatosplenomegaly. There are normal bowel sounds in all quadrants. GU: Rectal deferred.   MUSCULOSKELETAL: Normal muscle strength and tone. No cyanosis or edema.   SKIN: Turgor is good and there are no pathologic skin lesions or ulcers. NEUROLOGIC: Motor and sensation is grossly normal. Cranial nerves are grossly intact. PSYCH:  Oriented to person, place and time. Affect is normal.  Data Reviewed  I have personally reviewed the patient's imaging, laboratory findings and medical records.    Assessment/Plan [name] is a 68 year old male admitted for partial small bowel obstruction and significant presyncopal episodes and hypotension.  I have had a discussion in detail with Dr. Posey Pronto from the hospitalist service and she feels that he is a patient that belongs in the general surgery team.  I understand her concerns that of the primary issues are small bowel obstruction.  I have requested her expertise for all the other medical issues and she will follow him closely.  From the surgical perspective there is no need for emergent surgical intervention.  We will admit start crystalloids keep him n.p.o. and place an NG tube.  Will perform serial abdominal exams as well as serial labs and an x-ray in the morning.  Caroleen Hamman, MD FACS General Surgeon 01/23/2019, 5:13 PM

## 2019-01-24 ENCOUNTER — Inpatient Hospital Stay: Payer: Medicare Other

## 2019-01-24 LAB — BASIC METABOLIC PANEL
Anion gap: 6 (ref 5–15)
BUN: 36 mg/dL — ABNORMAL HIGH (ref 8–23)
CHLORIDE: 106 mmol/L (ref 98–111)
CO2: 28 mmol/L (ref 22–32)
Calcium: 8.4 mg/dL — ABNORMAL LOW (ref 8.9–10.3)
Creatinine, Ser: 1.97 mg/dL — ABNORMAL HIGH (ref 0.61–1.24)
GFR calc Af Amer: 39 mL/min — ABNORMAL LOW (ref >60)
GFR calc non Af Amer: 34 mL/min — ABNORMAL LOW (ref >60)
Glucose, Bld: 116 mg/dL — ABNORMAL HIGH (ref 70–99)
Potassium: 3.5 mmol/L (ref 3.5–5.1)
SODIUM: 140 mmol/L (ref 135–145)

## 2019-01-24 LAB — CBC
HCT: 39.9 % (ref 39.0–52.0)
Hemoglobin: 12.8 g/dL — ABNORMAL LOW (ref 13.0–17.0)
MCH: 27.5 pg (ref 26.0–34.0)
MCHC: 32.1 g/dL (ref 30.0–36.0)
MCV: 85.6 fL (ref 80.0–100.0)
Platelets: 260 10*3/uL (ref 150–400)
RBC: 4.66 MIL/uL (ref 4.22–5.81)
RDW: 14.3 % (ref 11.5–15.5)
WBC: 10.1 10*3/uL (ref 4.0–10.5)
nRBC: 0 % (ref 0.0–0.2)

## 2019-01-24 MED ORDER — LACTATED RINGERS IV BOLUS
1000.0000 mL | Freq: Once | INTRAVENOUS | Status: AC
  Administered 2019-01-24: 1000 mL via INTRAVENOUS

## 2019-01-24 MED ORDER — PAROXETINE HCL 10 MG PO TABS
10.0000 mg | ORAL_TABLET | Freq: Every day | ORAL | Status: DC
  Administered 2019-01-24 – 2019-01-26 (×3): 10 mg via ORAL
  Filled 2019-01-24 (×3): qty 1

## 2019-01-24 MED ORDER — HYDRALAZINE HCL 20 MG/ML IJ SOLN
10.0000 mg | Freq: Four times a day (QID) | INTRAMUSCULAR | Status: DC | PRN
  Administered 2019-01-24 – 2019-01-25 (×2): 10 mg via INTRAVENOUS
  Filled 2019-01-24 (×2): qty 1

## 2019-01-24 MED ORDER — OLANZAPINE 10 MG PO TABS
20.0000 mg | ORAL_TABLET | Freq: Every day | ORAL | Status: DC
  Administered 2019-01-24 – 2019-01-25 (×2): 20 mg via ORAL
  Filled 2019-01-24 (×3): qty 2

## 2019-01-24 NOTE — Progress Notes (Signed)
[place] at [place] NAME: [name]    MR#:  [id number]  DATE OF BIRTH:  1951/07/31  SUBJECTIVE:  CHIEF COMPLAINT:   Chief Complaint  Patient presents with  . Abdominal Pain   - abdominal distention, feels better. NG tube pulled out  REVIEW OF SYSTEMS:  Review of Systems  Constitutional: Negative for chills and fever.  HENT: Negative for ear discharge, hearing loss and nosebleeds.   Eyes: Negative for blurred vision and double vision.  Respiratory: Negative for cough, shortness of breath and wheezing.   Cardiovascular: Negative for chest pain, palpitations and leg swelling.  Gastrointestinal: Positive for abdominal pain. Negative for constipation, diarrhea, nausea and vomiting.  Genitourinary: Negative for dysuria.  Musculoskeletal: Negative for myalgias.  Neurological: Negative for dizziness, focal weakness, seizures, weakness and headaches.  Psychiatric/Behavioral: Negative for depression.    DRUG ALLERGIES:   Allergies  Allergen Reactions  . Vitamin B12 Other (See Comments)    interacted with other medications     VITALS:  Blood pressure (!) 157/89, pulse 72, temperature 98.1 F (36.7 C), temperature source Oral, resp. rate 16, SpO2 99 %.  PHYSICAL EXAMINATION:  Physical Exam  GENERAL:  68 y.o.-year-old patient lying in the bed with no acute distress.  EYES: Pupils equal, round, reactive to light and accommodation. No scleral icterus. Extraocular muscles intact.  HEENT: Head atraumatic, normocephalic. Oropharynx and nasopharynx clear.  NECK:  Supple, no jugular venous distention. No thyroid enlargement, no tenderness.  LUNGS: Normal breath sounds bilaterally, no wheezing, rales,rhonchi or crepitation. No use of accessory muscles of respiration.  CARDIOVASCULAR: S1, S2 normal. No murmurs, rubs, or gallops.  ABDOMEN: Soft,  Distended, non distended. Hypoactive Bowel sounds present. No organomegaly or mass.    EXTREMITIES: No pedal edema, cyanosis, or clubbing.  NEUROLOGIC: Cranial nerves II through XII are intact. Muscle strength 5/5 in all extremities. Sensation intact. Gait not checked.  PSYCHIATRIC: The patient is alert and oriented x 3.  SKIN: No obvious rash, lesion, or ulcer.    LABORATORY PANEL:   CBC Recent Labs  Lab 01/24/19 0553  WBC 10.1  HGB 12.8*  HCT 39.9  PLT 260   ------------------------------------------------------------------------------------------------------------------  Chemistries  Recent Labs  Lab 01/23/19 1438 01/24/19 0553  NA 138 140  K 4.4 3.5  CL 105 106  CO2 22 28  GLUCOSE 101* 116*  BUN 30* 36*  CREATININE 1.97* 1.97*  CALCIUM 8.6* 8.4*  AST 29  --   ALT 25  --   ALKPHOS 91  --   BILITOT 1.1  --    ------------------------------------------------------------------------------------------------------------------  Cardiac Enzymes Recent Labs  Lab 01/23/19 1753  TROPONINI <0.03   ------------------------------------------------------------------------------------------------------------------  RADIOLOGY:  Dg Abd 1 View  Result Date: 01/24/2019 CLINICAL DATA:  NG tube placement EXAM: ABDOMEN - 1 VIEW COMPARISON:  CT 01/23/2019 FINDINGS: NG tube tip is in the proximal to mid stomach. Mildly dilated small bowel loops compatible with small bowel obstruction. IMPRESSION: NG tube tip in the proximal to mid stomach. Electronically Signed   By: Rolm Baptise M.D.   On: 01/24/2019 01:12   Ct Abdomen Pelvis W Contrast  Result Date: 01/23/2019 CLINICAL DATA:  Generalized abdominal pain and vomiting since yesterday, hyponatremia EXAM: CT ABDOMEN AND PELVIS WITH CONTRAST TECHNIQUE: Multidetector CT imaging of the abdomen and pelvis was performed using the standard protocol following bolus administration of intravenous contrast. Sagittal and coronal MPR images reconstructed from axial data set. CONTRAST:  55mL OMNIPAQUE IOHEXOL  300 MG/ML SOLN IV. No  oral contrast. COMPARISON:  08/27/2017 FINDINGS: Lower chest: Minimal atelectasis at LEFT lung base Hepatobiliary: Contracted gallbladder.  Liver normal appearance Pancreas: Normal appearance Spleen: Normal appearance Adrenals/Urinary Tract: Adrenal glands, kidneys, ureters, and bladder normal appearance Stomach/Bowel: Normal appendix. Colon decompressed. Redundant sigmoid colon. Stomach unremarkable. Dilated proximal and decompressed distal small bowel loops compatible with small bowel obstruction. Small bowel stool sign at a bowel loop in the mid abdomen with an area of transition from dilated to normal caliber small bowel in the upper RIGHT pelvis question adhesion. Minimal bowel wall thickening at site. Vascular/Lymphatic: Atherosclerotic calcifications aorta and iliac arteries. No adenopathy. Reproductive: Prostatic enlargement with gland measuring 5.6 x 4.1 cm image 86. Other: Small BILATERAL inguinal hernias containing fat. Scattered free intraperitoneal fluid. Question prior ventral hernia repair. No free air. Musculoskeletal: Bones demineralized. IMPRESSION: Small-bowel obstruction with transition zone in the upper RIGHT pelvis, favor adhesion, with mild bowel wall thickening seen at site. Scattered free fluid without free air/evidence of perforation. Prostatic enlargement. BILATERAL inguinal hernias containing fat. Electronically Signed   By: Lavonia Dana M.D.   On: 01/23/2019 15:22   Dg Abd Portable 2v  Result Date: 01/24/2019 CLINICAL DATA:  Small bowel obstruction EXAM: PORTABLE ABDOMEN - 2 VIEW COMPARISON:  01/24/2019 FINDINGS: Enteric tube is no longer seen. Dilated small bowel loop in the left upper quadrant. There is partial small bowel obstruction by abdominal CT yesterday, with no interval change in bowel gas pattern. Gas and stool is still seen within the colon. Clear lung bases. IMPRESSION: Unchanged bowel gas pattern in this patient with partial small bowel obstruction by CT yesterday.  Electronically Signed   By: Monte Fantasia M.D.   On: 01/24/2019 10:17    EKG:   Orders placed or performed during the hospital encounter of 01/23/19  . EKG 12-Lead  . EKG 12-Lead  . ED EKG  . ED EKG    ASSESSMENT AND PLAN:   68 year old male with past medical history significant for schizophrenia, chronic dizziness, prior history of SBO requiring exploratory laparotomy comes to hospital secondary to abdominal pain  1.  SBO-conservative management at this time as bowel function is returning. -Had a BM today.  NG tube pulled out, -KUB still showing obstruction.  Continue n.p.o. status with ice chips today.  2.  Acute renal failure-baseline creatinine of 1.4, continue IV fluids at this time -Avoid nephrotoxins  3.  Schizophrenia-continue home medications.  Patient on Risperdal, Paxil, Zyprexa  4.  Hypertension-hold blood pressure medications.  Patient has history of chronic dizziness and orthostatic blood pressure drop. -since blood pressure is elevated, hold midodrine and Florinef  5. DVT Prophylaxis- SQ heparin  Ambulatory at baseline   All the records are reviewed and case discussed with Care Management/Social Workerr. Management plans discussed with the patient, family and they are in agreement.  CODE STATUS: Full Code  TOTAL TIME TAKING CARE OF THIS PATIENT: 38 minutes.   POSSIBLE D/C IN 1-2 DAYS, DEPENDING ON CLINICAL CONDITION.   Gladstone Lighter M.D on 01/24/2019 at 1:54 PM  Between 7am to 6pm - Pager - [phone]  After 6pm go to [email or web address] - password [email or web address] [facility]  Office  [phone]  CC: Primary care physician; Birdie Sons, MD

## 2019-01-24 NOTE — Progress Notes (Signed)
[name], [name], [name], [name], [name]. Bradley reports flatus with a BM this [name], and his sister at bedside confirms. Sister is also concerned this [name] about restarting his psychiatric medications.    Review of Systems:  Constitutional: denies fever, chills  Cardiovascular: denies chest [name] Bradley palpitations  Gastrointestinal: denies abdominal [name], N/V, Bradley diarrhea/and bowel function as per interval history Genitourinary: denies burning with urination Bradley urinary frequency   Vital signs in last 24 hours: [min-max] current  Temp:  [97.6 F (36.4 C)-98.3 F (36.8 C)] 97.6 F (36.4 C) (02/12 0429) Pulse Rate:  [69-94] 71 (02/12 0429) Resp:  [12-24] 18 (02/12 0429) BP: (74-161)/(46-94) 133/77 (02/12 0429) SpO2:  [94 %-100 %] 98 % (02/12 0429)             Intake/Output this shift:  No intake/output data recorded.   Intake/Output last 2 shifts:  @IOLAST2SHIFTS @   Physical Exam:  Constitutional: alert, cooperative and no distress  HENT: normocephalic without obvious abnormality  Eyes: EOM's grossly intact and symmetric  Respiratory: breathing non-labored at rest  Gastrointestinal: soft, non-tender, and mild distension, no rebound/guarding Musculoskeletal: no edema Bradley wounds, motor and sensation grossly intact, NT    Labs:  CBC Latest Ref Rng & Units 01/23/2019 06/15/2018 08/27/2017  WBC 4.0 - 10.5 K/uL 11.1(H) 12.1(H) 8.9  Hemoglobin 13.0 - 17.0 g/dL 13.7 14.3 13.6  Hematocrit 39.0 - 52.0 % 43.5 43.0 40.3  Platelets 150 - 400 K/uL 207 211 211   CMP Latest Ref Rng & Units 01/23/2019 06/15/2018 08/27/2017  Glucose 70 - 99  mg/dL 101(H) 174(H) 84  BUN 8 - 23 mg/dL 30(H) 22 16  Creatinine 0.61 - 1.24 mg/dL 1.97(H) 1.87(H) 1.64(H)  Sodium 135 - 145 mmol/L 138 139 140  Potassium 3.5 - 5.1 mmol/L 4.4 4.7 4.0  Chloride 98 - 111 mmol/L 105 106 105  CO2 22 - 32 mmol/L 22 22 29   Calcium 8.9 - 10.3 mg/dL 8.6(L) 9.5 9.1  Total Protein 6.5 - 8.1 g/dL 7.1 - 7.2  Total Bilirubin 0.3 - 1.2 mg/dL 1.1 - 0.6  Alkaline Phos 38 - 126 U/L 91 - 85  AST 15 - 41 U/L 29 - 19  ALT 0 - 44 U/L 25 - 15(L)     Assessment/Plan: (ICD-10's: K55.609) 68 y.o. male with possible improved/resolving partial small bowel obstruction, likely attributable to post-surgical adhesion, despite early removal of Bradley, complicated by pertinent comorbidities including a history of SBO requiring exploratory laparotomy 2 years ago, schizophrenia, and obesity.   - Okay for sips/ice sips today, continue IVF  - Continue to monitor abd exam, KUB  - Check CBC/BMP this [name]  - Bradley was removed early however given signs of bowel function return will hold off on replacing this. Patient and his sister understand that if Bradley develops [name]/[name]/emesis, the Bradley will likely be replaced.   - No indication for emergent surgical intervention currently.   - Restart home medications (especially pysch)  - Medical management of comorbidities (appreciate hospitalist help)  - DVT prophylaxis   All of the above findings and recommendations were discussed with the patient, patient's family, and the medical team, and  all of patient's and family's questions were answered to their expressed satisfaction.  -- Edison Simon, PA-C [facility] 01/24/2019, 8:40 AM [phone] M-F: 7am - 4pm

## 2019-01-24 NOTE — Progress Notes (Signed)
MD called due to patient blood pressure 175/98. Verbal order given to administer 10mg  Hydralazine IV if systolic is over 060. Every 6 hours as needed. Will recheck blood pressure before administration and will check blood pressure cuff for size.

## 2019-01-24 NOTE — Progress Notes (Signed)
Patient NG tube dislodged after patient stated that he sneezed it out while using the spirometer

## 2019-01-25 ENCOUNTER — Inpatient Hospital Stay: Payer: Medicare Other

## 2019-01-25 LAB — CBC
HCT: 37.2 % — ABNORMAL LOW (ref 39.0–52.0)
Hemoglobin: 12.1 g/dL — ABNORMAL LOW (ref 13.0–17.0)
MCH: 27.3 pg (ref 26.0–34.0)
MCHC: 32.5 g/dL (ref 30.0–36.0)
MCV: 83.8 fL (ref 80.0–100.0)
Platelets: 248 10*3/uL (ref 150–400)
RBC: 4.44 MIL/uL (ref 4.22–5.81)
RDW: 13.8 % (ref 11.5–15.5)
WBC: 9.3 10*3/uL (ref 4.0–10.5)
nRBC: 0 % (ref 0.0–0.2)

## 2019-01-25 LAB — BASIC METABOLIC PANEL
Anion gap: 4 — ABNORMAL LOW (ref 5–15)
BUN: 22 mg/dL (ref 8–23)
CO2: 28 mmol/L (ref 22–32)
Calcium: 8.2 mg/dL — ABNORMAL LOW (ref 8.9–10.3)
Chloride: 109 mmol/L (ref 98–111)
Creatinine, Ser: 1.27 mg/dL — ABNORMAL HIGH (ref 0.61–1.24)
GFR calc Af Amer: >60 mL/min (ref >60)
GFR calc non Af Amer: 57 mL/min — ABNORMAL LOW (ref >60)
Glucose, Bld: 119 mg/dL — ABNORMAL HIGH (ref 70–99)
Potassium: 3.3 mmol/L — ABNORMAL LOW (ref 3.5–5.1)
Sodium: 141 mmol/L (ref 135–145)

## 2019-01-25 LAB — HIV ANTIBODY (ROUTINE TESTING W REFLEX): HIV Screen 4th Generation wRfx: NONREACTIVE

## 2019-01-25 LAB — GLUCOSE, CAPILLARY: GLUCOSE-CAPILLARY: 115 mg/dL — AB (ref 70–99)

## 2019-01-25 MED ORDER — POTASSIUM CHLORIDE CRYS ER 20 MEQ PO TBCR
20.0000 meq | EXTENDED_RELEASE_TABLET | Freq: Two times a day (BID) | ORAL | Status: DC
  Administered 2019-01-25 – 2019-01-26 (×3): 20 meq via ORAL
  Filled 2019-01-25 (×3): qty 1

## 2019-01-25 MED ORDER — DEXTROSE-NACL 5-0.45 % IV SOLN
INTRAVENOUS | Status: AC
  Administered 2019-01-25: 12:00:00 via INTRAVENOUS

## 2019-01-25 MED ORDER — BOOST / RESOURCE BREEZE PO LIQD CUSTOM
1.0000 | Freq: Three times a day (TID) | ORAL | Status: DC
  Administered 2019-01-25 (×2): 1 via ORAL
  Administered 2019-01-25: 10:00:00 via ORAL
  Administered 2019-01-26: 1 via ORAL

## 2019-01-25 NOTE — Progress Notes (Signed)
[facility] [place] NAME: [name]    MR#:  [id number]  DATE OF BIRTH:  May 02, 1951  SUBJECTIVE:  CHIEF COMPLAINT:   Chief Complaint  Patient presents with  . Abdominal Pain   -2 BMs yesterday.  Feels some better. -Abdomen is still slightly distended  REVIEW OF SYSTEMS:  Review of Systems  Constitutional: Negative for chills and fever.  HENT: Negative for ear discharge, hearing loss and nosebleeds.   Eyes: Negative for blurred vision and double vision.  Respiratory: Negative for cough, shortness of breath and wheezing.   Cardiovascular: Negative for chest pain, palpitations and leg swelling.  Gastrointestinal: Negative for abdominal pain, constipation, diarrhea, nausea and vomiting.  Genitourinary: Negative for dysuria.  Musculoskeletal: Negative for myalgias.  Neurological: Negative for dizziness, focal weakness, seizures, weakness and headaches.  Psychiatric/Behavioral: Negative for depression.    DRUG ALLERGIES:   Allergies  Allergen Reactions  . Vitamin B12 Other (See Comments)    interacted with other medications     VITALS:  Blood pressure (!) 155/77, pulse 79, temperature 98.2 F (36.8 C), temperature source Oral, resp. rate 20, height 6' (1.829 m), weight 109 kg, SpO2 96 %.  PHYSICAL EXAMINATION:  Physical Exam  GENERAL:  68 y.o.-year-old patient lying in the bed with no acute distress.  EYES: Pupils equal, round, reactive to light and accommodation. No scleral icterus. Extraocular muscles intact.  HEENT: Head atraumatic, normocephalic. Oropharynx and nasopharynx clear.  NECK:  Supple, no jugular venous distention. No thyroid enlargement, no tenderness.  LUNGS: Normal breath sounds bilaterally, no wheezing, rales,rhonchi or crepitation. No use of accessory muscles of respiration.  CARDIOVASCULAR: S1, S2 normal. No murmurs, rubs, or gallops.  ABDOMEN: Soft, nontender, slightly distended.  Improved  bowel  sounds present. No organomegaly or mass.  EXTREMITIES: No pedal edema, cyanosis, or clubbing.  NEUROLOGIC: Cranial nerves II through XII are intact. Muscle strength 5/5 in all extremities. Sensation intact. Gait not checked.  PSYCHIATRIC: The patient is alert and oriented x 3.  SKIN: No obvious rash, lesion, or ulcer.    LABORATORY PANEL:   CBC Recent Labs  Lab 01/25/19 0422  WBC 9.3  HGB 12.1*  HCT 37.2*  PLT 248   ------------------------------------------------------------------------------------------------------------------  Chemistries  Recent Labs  Lab 01/23/19 1438  01/25/19 0422  NA 138   < > 141  K 4.4   < > 3.3*  CL 105   < > 109  CO2 22   < > 28  GLUCOSE 101*   < > 119*  BUN 30*   < > 22  CREATININE 1.97*   < > 1.27*  CALCIUM 8.6*   < > 8.2*  AST 29  --   --   ALT 25  --   --   ALKPHOS 91  --   --   BILITOT 1.1  --   --    < > = values in this interval not displayed.   ------------------------------------------------------------------------------------------------------------------  Cardiac Enzymes Recent Labs  Lab 01/23/19 1753  TROPONINI <0.03   ------------------------------------------------------------------------------------------------------------------  RADIOLOGY:  Dg Abd 1 View  Result Date: 01/25/2019 CLINICAL DATA:  Follow-up small bowel obstruction EXAM: ABDOMEN - 1 VIEW COMPARISON:  Yesterday FINDINGS: Diffuse gaseous distention of colon. No dilated small bowel is seen today, although this does not exclude fluid-filled loops. No evident pneumatosis or abnormal gas collection. Mild atelectasis at the lung bases. IMPRESSION: 1. No dilated small bowel loops are seen today. 2. Diffuse gaseous  distention of colon. Electronically Signed   By: Monte Fantasia M.D.   On: 01/25/2019 07:07   Dg Abd 1 View  Result Date: 01/24/2019 CLINICAL DATA:  NG tube placement EXAM: ABDOMEN - 1 VIEW COMPARISON:  CT 01/23/2019 FINDINGS: NG tube tip is in the  proximal to mid stomach. Mildly dilated small bowel loops compatible with small bowel obstruction. IMPRESSION: NG tube tip in the proximal to mid stomach. Electronically Signed   By: Rolm Baptise M.D.   On: 01/24/2019 01:12   Ct Abdomen Pelvis W Contrast  Result Date: 01/23/2019 CLINICAL DATA:  Generalized abdominal pain and vomiting since yesterday, hyponatremia EXAM: CT ABDOMEN AND PELVIS WITH CONTRAST TECHNIQUE: Multidetector CT imaging of the abdomen and pelvis was performed using the standard protocol following bolus administration of intravenous contrast. Sagittal and coronal MPR images reconstructed from axial data set. CONTRAST:  10mL OMNIPAQUE IOHEXOL 300 MG/ML SOLN IV. No oral contrast. COMPARISON:  08/27/2017 FINDINGS: Lower chest: Minimal atelectasis at LEFT lung base Hepatobiliary: Contracted gallbladder.  Liver normal appearance Pancreas: Normal appearance Spleen: Normal appearance Adrenals/Urinary Tract: Adrenal glands, kidneys, ureters, and bladder normal appearance Stomach/Bowel: Normal appendix. Colon decompressed. Redundant sigmoid colon. Stomach unremarkable. Dilated proximal and decompressed distal small bowel loops compatible with small bowel obstruction. Small bowel stool sign at a bowel loop in the mid abdomen with an area of transition from dilated to normal caliber small bowel in the upper RIGHT pelvis question adhesion. Minimal bowel wall thickening at site. Vascular/Lymphatic: Atherosclerotic calcifications aorta and iliac arteries. No adenopathy. Reproductive: Prostatic enlargement with gland measuring 5.6 x 4.1 cm image 86. Other: Small BILATERAL inguinal hernias containing fat. Scattered free intraperitoneal fluid. Question prior ventral hernia repair. No free air. Musculoskeletal: Bones demineralized. IMPRESSION: Small-bowel obstruction with transition zone in the upper RIGHT pelvis, favor adhesion, with mild bowel wall thickening seen at site. Scattered free fluid without free  air/evidence of perforation. Prostatic enlargement. BILATERAL inguinal hernias containing fat. Electronically Signed   By: Lavonia Dana M.D.   On: 01/23/2019 15:22   Dg Abd Portable 2v  Result Date: 01/24/2019 CLINICAL DATA:  Small bowel obstruction EXAM: PORTABLE ABDOMEN - 2 VIEW COMPARISON:  01/24/2019 FINDINGS: Enteric tube is no longer seen. Dilated small bowel loop in the left upper quadrant. There is partial small bowel obstruction by abdominal CT yesterday, with no interval change in bowel gas pattern. Gas and stool is still seen within the colon. Clear lung bases. IMPRESSION: Unchanged bowel gas pattern in this patient with partial small bowel obstruction by CT yesterday. Electronically Signed   By: Monte Fantasia M.D.   On: 01/24/2019 10:17    EKG:   Orders placed or performed during the hospital encounter of 01/23/19  . EKG 12-Lead  . EKG 12-Lead  . ED EKG  . ED EKG    ASSESSMENT AND PLAN:   68 year old male with past medical history significant for schizophrenia, chronic dizziness, prior history of SBO requiring exploratory laparotomy comes to hospital secondary to abdominal pain  1.  SBO-conservative management at this time as bowel function is returning. -Had 2 BM's yesterday.  NG tube out -KUB showing increased bowel gas, no obstruction seen. -Started on clears today.  Adjust IV fluids.  2.  Acute renal failure-baseline creatinine of 1.4, continue IV fluids at this time -Avoid nephrotoxins  3.  Schizophrenia-continue home medications.  Patient on Risperdal, Paxil, Zyprexa  4.  Hypertension-hold blood pressure medications.  Patient has history of chronic dizziness and orthostatic blood pressure drop. -  since blood pressure is elevated, hold midodrine and Florinef  5. DVT Prophylaxis- SQ heparin  Ambulatory at baseline   All the records are reviewed and case discussed with Care Management/Social Workerr. Management plans discussed with the patient, family and they  are in agreement.  CODE STATUS: Full Code  TOTAL TIME TAKING CARE OF THIS PATIENT: 38 minutes.   POSSIBLE D/C IN 1-2 DAYS, DEPENDING ON CLINICAL CONDITION.   Gladstone Lighter M.D on 01/25/2019 at 10:37 AM  Between 7am to 6pm - Pager - [phone]  After 6pm go to [email or web address] - password [email or web address] [facility]  Office  [phone]  CC: Primary care physician; Birdie Sons, MD

## 2019-01-25 NOTE — Progress Notes (Signed)
[facility] SURGICAL PROGRESS NOTE (cpt [phone])  Hospital Day(s): 2.   Post op day(s):  [name]   Interval History: Patient seen and examined, no acute events or new complaints overnight. Patient denied any abdominal pain, nausea, emesis, fever, or chills. He, his sister, and the patient's RN all endorse continued flatus and BMs throughout the day yesterday. Tolerated ice chips and sips with medications yesterday. No other complaints this morning.   Review of Systems:  Constitutional: denies fever, chills  Respiratory: denies any shortness of breath  Cardiovascular: denies chest pain or palpitations  Gastrointestinal: denies abdominal pain, N/V, or diarrhea/and bowel function as per interval history Genitourinary: denies burning with urination or urinary frequency   Vital signs in last 24 hours: [min-max] current  Temp:  [97.6 F (36.4 C)-98.2 F (36.8 C)] 98.2 F (36.8 C) (02/13 0551) Pulse Rate:  [66-79] 79 (02/13 0551) Resp:  [16-20] 20 (02/13 0551) BP: (155-181)/(77-98) 155/77 (02/13 0551) SpO2:  [96 %-99 %] 96 % (02/13 0551)             Intake/Output this shift:  No intake/output data recorded.   Intake/Output last 2 shifts:  @IOLAST2SHIFTS @   Physical Exam:  Constitutional: alert, cooperative and no distress  HENT: normocephalic without obvious abnormality  Eyes: EOM's grossly intact and symmetric  Respiratory: breathing non-labored at rest  Gastrointestinal: soft, non-tender, and mild distension, no rebound/guarding Musculoskeletal: no edema or wounds, motor and sensation grossly intact, NT    Labs:  CBC Latest Ref Rng & Units 01/25/2019 01/24/2019 01/23/2019  WBC 4.0 - 10.5 K/uL 9.3 10.1 11.1(H)  Hemoglobin 13.0 - 17.0 g/dL 12.1(L) 12.8(L) 13.7  Hematocrit 39.0 - 52.0 % 37.2(L) 39.9 43.5  Platelets 150 - 400 K/uL 248 260 207   CMP Latest Ref Rng & Units 01/25/2019 01/24/2019 01/23/2019  Glucose 70 - 99 mg/dL 119(H) 116(H) 101(H)  BUN 8 - 23 mg/dL 22  36(H) 30(H)  Creatinine 0.61 - 1.24 mg/dL 1.27(H) 1.97(H) 1.97(H)  Sodium 135 - 145 mmol/L 141 140 138  Potassium 3.5 - 5.1 mmol/L 3.3(L) 3.5 4.4  Chloride 98 - 111 mmol/L 109 106 105  CO2 22 - 32 mmol/L 28 28 22   Calcium 8.9 - 10.3 mg/dL 8.2(L) 8.4(L) 8.6(L)  Total Protein 6.5 - 8.1 g/dL - - 7.1  Total Bilirubin 0.3 - 1.2 mg/dL - - 1.1  Alkaline Phos 38 - 126 U/L - - 91  AST 15 - 41 U/L - - 29  ALT 0 - 44 U/L - - 25     Imaging studies:   KUB (01/25/2019) personally reviewed and radiologist report reviewed: IMPRESSION: 1. No dilated small bowel loops are seen today. 2. Diffuse gaseous distention of colon.  Assessment/Plan: (ICD-10's: K42.609) 68 y.o. male with mild hypokalemia and improved renal function otherwise with resolving/resolved partial small bowel obstruction, likely attributable to post-surgical adehisions, complicated by pertinent comorbidities including history of SBO requiring exploratory laparotomy 2 years ago, schizophrenia, and obesity.   - Advance to clear liquids (ADAT) + Continue IVF  - Continue to monitor abdominal examination and on-going bowel function  - Replete K+, monitor electrolytes (BMP)  - No indication for emergent surgical intervention currently.              - Medical management of comorbidities (appreciate hospitalist help)             - DVT prophylaxis   All of the above findings and recommendations were discussed with the patient, patient's family, and the  medical team, and all of patient's and family's questions were answered to their expressed satisfaction.  -- Edison Simon, PA-C [facility] 01/25/2019, 7:21 AM [phone] M-F: 7am - 4pm

## 2019-01-26 LAB — BASIC METABOLIC PANEL
Anion gap: 6 (ref 5–15)
BUN: 12 mg/dL (ref 8–23)
CO2: 27 mmol/L (ref 22–32)
Calcium: 8.3 mg/dL — ABNORMAL LOW (ref 8.9–10.3)
Chloride: 107 mmol/L (ref 98–111)
Creatinine, Ser: 1.27 mg/dL — ABNORMAL HIGH (ref 0.61–1.24)
GFR calc Af Amer: >60 mL/min (ref >60)
GFR calc non Af Amer: 57 mL/min — ABNORMAL LOW (ref >60)
Glucose, Bld: 99 mg/dL (ref 70–99)
Potassium: 3.2 mmol/L — ABNORMAL LOW (ref 3.5–5.1)
Sodium: 140 mmol/L (ref 135–145)

## 2019-01-26 MED ORDER — METOPROLOL TARTRATE 50 MG PO TABS: 50.0000 mg | ORAL_TABLET | Freq: Two times a day (BID) | ORAL | Status: DC

## 2019-01-26 NOTE — Progress Notes (Signed)
Deactivated legal guardian. Pt only has POA.   Fuller Mandril, RN

## 2019-01-26 NOTE — Care Management Important Message (Signed)
Copy of signed Medicare IM left with patient in room. 

## 2019-01-26 NOTE — Discharge Instructions (Signed)
Bowel Obstruction °A bowel obstruction means that something is blocking the small or large bowel. The bowel is also called the intestine. It is the long tube that connects the stomach to the opening of the butt (anus). When something blocks the bowel, food and fluids cannot pass through like normal. This condition needs to be treated. Treatment depends on the cause of the problem and how bad the problem is. °What are the causes? °Common causes of this condition include: °· Scar tissue (adhesions) from past surgery or from high-energy X-rays (radiation). °· Recent surgery in the belly. This affects how food moves in the bowel. °· Some diseases, such as: °? Irritation of the lining of the digestive tract (Crohn's disease). °? Irritation of small pouches in the bowel (diverticulitis). °· Growths or tumors. °· A bulging organ (hernia). °· Twisting of the bowel (volvulus). °· A foreign body. °· Slipping of a part of the bowel into another part (intussusception). °What are the signs or symptoms? °Symptoms of this condition include: °· Pain in the belly. °· Feeling sick to your stomach (nauseous). °· Throwing up (vomiting). °· Bloating in the belly. °· Being unable to pass gas. °· Trouble pooping (constipation). °· Watery poop (diarrhea). °· A lot of belching. °How is this diagnosed? °This condition may be diagnosed based on: °· A physical exam. °· Medical history. °· Imaging tests, such as X-ray or CT scan. °· Blood tests. °· Urine tests. °How is this treated? °Treatment for this condition may include: °· Fluids and pain medicines that are given through an IV tube. Your doctor may tell you not to eat or drink if you feel sick to your stomach and are throwing up. °· Eating a clear liquid diet for a few days. °· Putting a small tube (nasogastric tube) into the stomach. This will help with pain, discomfort, and nausea by removing blocked air and fluids from the stomach. °· Surgery. This may be needed if other treatments do  not work. °Follow these instructions at home: °Medicines °· Take over-the-counter and prescription medicines only as told by your doctor. °· If you were prescribed an antibiotic medicine, take it as told by your doctor. Do not stop taking the antibiotic even if you start to feel better. °General instructions °· Follow your diet as told by your doctor. You may need to: °? Only drink clear liquids until you start to get better. °? Avoid solid foods. °· Return to your normal activities as told by your doctor. Ask your doctor what activities are safe for you. °· Do not sit for a long time without moving. Get up to take short walks every 1-2 hours. This is important. Ask for help if you feel weak or unsteady. °· Keep all follow-up visits as told by your doctor. This is important. °How is this prevented? °After having a bowel obstruction, you may be more likely to have another. You can do some things to stop it from happening again. °· If you have a long-term (chronic) disease, contact your doctor if you see changes or problems. °· Take steps to prevent or treat trouble pooping. Your doctor may ask that you: °? Drink enough fluid to keep your pee (urine) pale yellow. °? Take over-the-counter or prescription medicines. °? Eat foods that are high in fiber. These include beans, whole grains, and fresh fruits and vegetables. °? Limit foods that are high in fat and sugar. These include fried or sweet foods. °· Stay active. Ask your doctor which exercises are   safe for you. °· Avoid stress. °· Eat three small meals and three small snacks each day. °· Work with a food expert (dietitian) to make a meal plan that works for you. °· Do not use any products that contain nicotine or tobacco, such as cigarettes and e-cigarettes. If you need help quitting, ask your doctor. °Contact a doctor if: °· You have a fever. °· You have chills. °Get help right away if: °· You have pain or cramps that get worse. °· You throw up blood. °· You are  sick to your stomach. °· You cannot stop throwing up. °· You cannot drink fluids. °· You feel mixed up (confused). °· You feel very thirsty (dehydrated). °· Your belly gets more bloated. °· You feel weak or you pass out (faint). °Summary °· A bowel obstruction means that something is blocking the small or large bowel. °· Treatment may include IV fluids and pain medicine. You may also have a clear liquid diet, a small tube in your stomach, or surgery. °· Drink clear liquids and avoid solid foods until you get better. °This information is not intended to replace advice given to you by your health care provider. Make sure you discuss any questions you have with your health care provider. °Document Released: 01/06/2005 Document Revised: 04/12/2018 Document Reviewed: 04/12/2018 °Elsevier Interactive Patient Education © 2019 [employer] ° °

## 2019-01-26 NOTE — Progress Notes (Signed)
[name] [place] NAME: [name]    MR#:  [id number]  DATE OF BIRTH:  January 04, 1951  SUBJECTIVE:  CHIEF COMPLAINT:   Chief Complaint  Patient presents with  . Abdominal Pain   -Doing well today.  No abdominal pain -Passing gas, no bowel movements today.  Tolerating full liquid diet well -Ambulating well  REVIEW OF SYSTEMS:  Review of Systems  Constitutional: Negative for chills and fever.  HENT: Negative for ear discharge, hearing loss and nosebleeds.   Eyes: Negative for blurred vision and double vision.  Respiratory: Negative for cough, shortness of breath and wheezing.   Cardiovascular: Negative for chest pain, palpitations and leg swelling.  Gastrointestinal: Negative for abdominal pain, constipation, diarrhea, nausea and vomiting.  Genitourinary: Negative for dysuria.  Musculoskeletal: Negative for myalgias.  Neurological: Negative for dizziness, focal weakness, seizures, weakness and headaches.  Psychiatric/Behavioral: Negative for depression.    DRUG ALLERGIES:   Allergies  Allergen Reactions  . Vitamin B12 Other (See Comments)    interacted with other medications     VITALS:  Blood pressure (!) 154/86, pulse 85, temperature 97.9 F (36.6 C), temperature source Oral, resp. rate 16, height 6' (1.829 m), weight 109 kg, SpO2 97 %.  PHYSICAL EXAMINATION:  Physical Exam  GENERAL:  68 y.o.-year-old patient lying in the bed with no acute distress.  EYES: Pupils equal, round, reactive to light and accommodation. No scleral icterus. Extraocular muscles intact.  HEENT: Head atraumatic, normocephalic. Oropharynx and nasopharynx clear.  NECK:  Supple, no jugular venous distention. No thyroid enlargement, no tenderness.  LUNGS: Normal breath sounds bilaterally, no wheezing, rales,rhonchi or crepitation. No use of accessory muscles of respiration.  CARDIOVASCULAR: S1, S2 normal. No murmurs, rubs, or gallops.  ABDOMEN:  Soft, nontender, slightly distended.  Improved  bowel sounds present. No organomegaly or mass.  EXTREMITIES: No pedal edema, cyanosis, or clubbing.  NEUROLOGIC: Cranial nerves II through XII are intact. Muscle strength 5/5 in all extremities. Sensation intact. Gait not checked.  PSYCHIATRIC: The patient is alert and oriented x 3.  SKIN: No obvious rash, lesion, or ulcer.    LABORATORY PANEL:   CBC Recent Labs  Lab 01/25/19 0422  WBC 9.3  HGB 12.1*  HCT 37.2*  PLT 248   ------------------------------------------------------------------------------------------------------------------  Chemistries  Recent Labs  Lab 01/23/19 1438  01/26/19 0621  NA 138   < > 140  K 4.4   < > 3.2*  CL 105   < > 107  CO2 22   < > 27  GLUCOSE 101*   < > 99  BUN 30*   < > 12  CREATININE 1.97*   < > 1.27*  CALCIUM 8.6*   < > 8.3*  AST 29  --   --   ALT 25  --   --   ALKPHOS 91  --   --   BILITOT 1.1  --   --    < > = values in this interval not displayed.   ------------------------------------------------------------------------------------------------------------------  Cardiac Enzymes Recent Labs  Lab 01/23/19 1753  TROPONINI <0.03   ------------------------------------------------------------------------------------------------------------------  RADIOLOGY:  Dg Abd 1 View  Result Date: 01/25/2019 CLINICAL DATA:  Follow-up small bowel obstruction EXAM: ABDOMEN - 1 VIEW COMPARISON:  Yesterday FINDINGS: Diffuse gaseous distention of colon. No dilated small bowel is seen today, although this does not exclude fluid-filled loops. No evident pneumatosis or abnormal gas collection. Mild atelectasis at the lung bases. IMPRESSION: 1. No dilated  small bowel loops are seen today. 2. Diffuse gaseous distention of colon. Electronically Signed   By: Monte Fantasia M.D.   On: 01/25/2019 07:07    EKG:   Orders placed or performed during the hospital encounter of 01/23/19  . EKG 12-Lead  . EKG  12-Lead  . ED EKG  . ED EKG    ASSESSMENT AND PLAN:   68 year old male with past medical history significant for schizophrenia, chronic dizziness, prior history of SBO requiring exploratory laparotomy comes to hospital secondary to abdominal pain  1.  SBO-conservative management at this time as bowel function is returning. -Had 2 BMs and now passing flatus.  NG tube out -KUB showing improvement. -tolerating full liquid diet  2.  Acute renal failure-baseline creatinine of 1.4, on IV fluids at this time -Avoid nephrotoxins  3.  Schizophrenia-continue home medications.  Patient on Risperdal, Paxil, Zyprexa  4.  Hypertension-hold blood pressure medications.  Patient has history of chronic dizziness and orthostatic blood pressure drop. -since blood pressure is elevated, hold midodrine and Florinef  5. DVT Prophylaxis- SQ heparin  6.  Hypokalemia-replaced  Ambulatory at baseline Medically stable, will sign off today   All the records are reviewed and case discussed with Care Management/Social Workerr. Management plans discussed with the patient, family and they are in agreement.  CODE STATUS: Full Code  TOTAL TIME TAKING CARE OF THIS PATIENT: 37 minutes.   POSSIBLE D/C IN 1-2 DAYS, DEPENDING ON CLINICAL CONDITION.   Gladstone Lighter M.D on 01/26/2019 at 11:45 AM  Between 7am to 6pm - Pager - [phone]  After 6pm go to [email or web address] - password [email or web address] [facility]  Office  [phone]  CC: Primary care physician; Birdie Sons, MD

## 2019-01-26 NOTE — Discharge Summary (Signed)
[facility] SURGICAL DISCHARGE SUMMARY (cpt: [phone])  Patient ID: [name] MRN: [id number] DOB/AGE: October 24, 1951 68 y.o.  Admit date: 01/23/2019 Discharge date: 01/26/2019  Discharge Diagnoses Small Bowel Obstruction  Consultants None  Procedures None  HPI: [name] is a 68 y.o. male at the request of [place].  Game in with acute onset of abdominal pain nausea and vomiting.  The pain is diffusely, crampy and intermittent.  Moderate intensity.  Please note that now the pain has subsided.  He did have 3 episode of emesis that he self-induced.  He reports no flatus for the last 24 hours.  Of note he did have a similar episode of bowel obstruction requiring laparotomy 2 years ago.  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 The are adamant about no blood transfusion under no circumstances. He also reports significant dizziness hypotension episode and some orthostatic hypotension likely related to chronic hyponatremia.  Patient has had repetitive falls for the last few weeks.  Has any fevers no chills no weight loss. The scan of the abdomen/ pelvis showed some small bowel dilation  Hospital Course: NGT was placed in the ED prior to admission on 02/11 however this was prematurely removed after only about 12 hours. However, he showed signs of bowel function return at that time. Diet was restarted on 2/13 and advanced as tolerated which went without issue. The remainder of patient's hospital course was essentially unremarkable, and discharge planning was initiated accordingly with patient safely able to be discharged home with appropriate discharge instructions, pain control, and outpatient follow-up after all of his questions were answered to his expressed satisfaction.  Discharge Condition: Good   Physical Examination:  Constitutional: alert, cooperative and no distress  HENT: normocephalic without obvious abnormality  Eyes:  EOM's grossly intact and symmetric  Respiratory: breathing non-labored at rest  Gastrointestinal: soft, non-tender, andmild distension, no rebound/guarding Musculoskeletal: no edema or wounds, motor and sensation grossly intact, NT    Allergies as of 01/26/2019      Reactions   Vitamin B12 Other (See Comments)   interacted with other medications       Medication List    TAKE these medications   fludrocortisone 0.1 MG tablet Commonly known as:  FLORINEF TAKE 1 TABLET (100MCG TOTAL) BY MOUTH (TWO) TIMES DAILY What changed:    how much to take  how to take this  when to take this  additional instructions   midodrine 10 MG tablet Commonly known as:  PROAMATINE TAKE 1 TABLET BY MOUTH 3 TIMES A DAY WITH MEALS What changed:    how much to take  how to take this  when to take this  additional instructions   OLANZapine 20 MG tablet Commonly known as:  ZYPREXA Take 1 tablet (20 mg total) by mouth at bedtime.   PARoxetine 10 MG tablet Commonly known as:  PAXIL Take 1 tablet (10 mg total) by mouth daily.   pravastatin 40 MG tablet Commonly known as:  PRAVACHOL TAKE 1 TABLET BY MOUTH EVERY DAY   risperidone 4 MG tablet Commonly known as:  RISPERDAL Take 1 tablet (4 mg total) by mouth at bedtime.        Follow-up Information    [place], [place] F, MD. Schedule an appointment as soon as possible for a visit in 3 week(s).   Specialty:  General Surgery Why:  s/p SBO Contact information: [address] [place] [address] [phone]            --  Edison Simon , PA-C [facility]  01/26/2019, 1:33 PM [phone] M-F: 7am - 4pm

## 2019-02-01 ENCOUNTER — Telehealth: Payer: Self-pay | Admitting: Family Medicine

## 2019-02-01 DIAGNOSIS — Z86010 Personal history of colonic polyps: Secondary | ICD-10-CM

## 2019-02-01 DIAGNOSIS — K5900 Constipation, unspecified: Secondary | ICD-10-CM

## 2019-02-01 NOTE — Telephone Encounter (Signed)
Pt is needing a stronger laxative called in today.  The one prescribed is not working well for him.  Please call into:  [place], [place] - [place] [phone] (Phone) [phone] (Fax)   Thanks, [facility]

## 2019-02-02 MED ORDER — LUBIPROSTONE 24 MCG PO CAPS: 24.0000 ug | ORAL_CAPSULE | Freq: Two times a day (BID) | ORAL | 6 refills | Status: DC

## 2019-02-02 NOTE — Telephone Encounter (Signed)
Spoke with patient's sister and she reports that he is currently taking generic Miralax. She is requesting something stronger. Please advise. Thanks!

## 2019-02-02 NOTE — Telephone Encounter (Signed)
Have sent prescription for amitiza, but he needs referral to Dr. Tiffany Kocher for follow up colon adenoma. He is overdue for follow up. Referral order has been entered.

## 2019-02-02 NOTE — Telephone Encounter (Signed)
Patients sister [name] advised and verbally voiced understanding.

## 2019-02-02 NOTE — Telephone Encounter (Signed)
What is he taking?

## 2019-02-02 NOTE — Telephone Encounter (Signed)
Tried calling; pt's voicemail is not set up.   Thanks,   -Laura  

## 2019-02-07 ENCOUNTER — Encounter: Payer: Self-pay | Admitting: Family Medicine

## 2019-02-07 DIAGNOSIS — K5904 Chronic idiopathic constipation: Secondary | ICD-10-CM | POA: Insufficient documentation

## 2019-02-14 ENCOUNTER — Inpatient Hospital Stay: Payer: Self-pay | Admitting: Surgery

## 2019-05-28 ENCOUNTER — Encounter: Payer: Self-pay | Admitting: *Deleted

## 2019-06-01 ENCOUNTER — Encounter: Payer: Self-pay | Admitting: Psychiatry

## 2019-06-01 ENCOUNTER — Other Ambulatory Visit: Payer: Self-pay

## 2019-06-01 ENCOUNTER — Ambulatory Visit (INDEPENDENT_AMBULATORY_CARE_PROVIDER_SITE_OTHER): Payer: Medicare Other | Admitting: Psychiatry

## 2019-06-01 DIAGNOSIS — F200 Paranoid schizophrenia: Secondary | ICD-10-CM | POA: Insufficient documentation

## 2019-06-01 DIAGNOSIS — G2401 Drug induced subacute dyskinesia: Secondary | ICD-10-CM | POA: Diagnosis not present

## 2019-06-01 DIAGNOSIS — R9431 Abnormal electrocardiogram [ECG] [EKG]: Secondary | ICD-10-CM | POA: Diagnosis not present

## 2019-06-01 MED ORDER — OLANZAPINE 10 MG PO TABS: 10.0000 mg | ORAL_TABLET | Freq: Every day | ORAL | 0 refills | Status: DC

## 2019-06-01 MED ORDER — RISPERIDONE 2 MG PO TABS: 2.0000 mg | ORAL_TABLET | Freq: Every day | ORAL | 0 refills | Status: DC

## 2019-06-01 MED ORDER — PAROXETINE HCL 10 MG PO TABS: 10.0000 mg | ORAL_TABLET | Freq: Every day | ORAL | 0 refills | Status: DC

## 2019-06-01 NOTE — Progress Notes (Signed)
Virtual Visit via Video Note  I connected with [name] on 06/01/19 at 11:45 AM EDT by a video enabled telemedicine application and verified that I am speaking with the correct person using two identifiers.   I discussed the limitations of evaluation and management by telemedicine and the availability of in person appointments. The patient expressed understanding and agreed to proceed.     I discussed the assessment and treatment plan with the patient. The patient was provided an opportunity to ask questions and all were answered. The patient agreed with the plan and demonstrated an understanding of the instructions.   The patient was advised to call back or seek an in-person evaluation if the symptoms worsen or if the condition fails to improve as anticipated.   [place] MD OP Progress Note  06/01/2019 12:56 PM [name]  MRN:  [id number]  Chief Complaint:  Chief Complaint    Follow-up     HPI: [name] is a 68 year old patient male, divorced, lives in [place], has a history of schizophrenia, tardive dyskinesia, was evaluated by telemedicine today.  Patient being a limited historian collateral information was obtained from his sister Ms. [name] -who is the healthcare power of attorney.  Patient today appeared to be alert, oriented to person and situation.  Patient reports he is currently doing well on the current medication regimen.  He denies any significant side effects.  He reports sleep is good.  He denies any perceptual disturbances.  He denies any suicidality or homicidality.  He does have chronic tardive dyskinesia, movements around his mouth.  He reports he copes with it okay and is not interested in medications.  Per sister patient is currently doing well on the current combination of medications.  On review of his medical records patient had an EKG done on 01/23/2019.  Writer did not receive this EKG report until now.  Patient was last seen in December 2019.  The  EKG prior to that on 06/16/2018 had a QTC of 420.  The EKG on 01/23/2019 has prolonged QTC on review.  This was discussed with patient's sister who is the power of attorney.  Discussed with her that patient is on multiple medications that can can cause QT prolongation and that he can have a cardiac arrest.  Sister is worried about patient decompensating if medications taken off to abruptly.  She reports he has a history of decompensating when medications are taken off.  Discussed with patient as well as sister that his medication dosages can be reduced at this time.  However a new EKG has to be taken as soon as possible and also a possible cardiology referral if needed.  Discussed with sister that I will also send information about this to primary medical doctor Dr. Lelon Huh.  I have advised them to contact primary provider as soon as possible for evaluation.  Sister to monitor patient's symptoms closely and return for a follow-up visit in a week from now.  Sister agrees with plan. Visit Diagnosis:    ICD-10-CM   1. Paranoid schizophrenia ([place])  F20.0 OLANZapine (ZYPREXA) 10 MG tablet    risperiDONE (RISPERDAL) 2 MG tablet    PARoxetine (PAXIL) 10 MG tablet  2. Prolonged Q-T interval on ECG  R94.31   3. Tardive dyskinesia  G24.01     Past Psychiatric History: I have reviewed past psychiatric history from my progress note on 02/23/2018.  Past trials of Haldol, Klonopin, Seroquel.  Past Medical History:  Past Medical History:  Diagnosis Date  . Chicken pox   . Dizziness   . Measles   . Mumps   . Schizophrenia [facility])     Past Surgical History:  Procedure Laterality Date  . ABDOMINAL SURGERY    . COLON SURGERY    . LAPAROTOMY N/A 03/20/2016   Procedure: EXPLORATORY LAPAROTOMY with lysis of adhesions;  Surgeon: Jules Husbands, MD;  Location: [facility] ORS;  Service: General;  Laterality: N/A;  . None      Family Psychiatric History: I have reviewed family psychiatric history from my progress note  on 02/23/2018.  Family History:  Family History  Problem Relation Age of Onset  . Diabetes Mother   . Heart disease Mother   . Stroke Mother   . Diabetes Father   . Hypertension Father   . Lung cancer Father   . Cancer Father        lung cancer  . Schizophrenia Sister   . Lung cancer Sister   . Colon cancer Sister     Social History: I have reviewed social history from my progress note on 02/23/2018. Social History   Socioeconomic History  . Marital status: Divorced    Spouse name: Not on file  . Number of children: 2  . Years of education: Not on file  . Highest education level: High school graduate  Occupational History  . Occupation: Disabled  Social Needs  . Financial resource strain: Not hard at all  . Food insecurity    Worry: Never true    Inability: Never true  . Transportation needs    Medical: No    Non-medical: No  Tobacco Use  . Smoking status: Former Smoker    Types: Cigarettes    Quit date: 08/04/2004    Years since quitting: 14.8  . Smokeless tobacco: Former Systems developer    Quit date: 08/04/2004  Substance and Sexual Activity  . Alcohol use: No    Alcohol/week: 0.0 standard drinks  . Drug use: No  . Sexual activity: Not Currently  Lifestyle  . Physical activity    Days per week: 0 days    Minutes per session: 0 min  . Stress: Not on file  Relationships  . Social connections    Talks on phone: More than three times a week    Gets together: More than three times a week    Attends religious service: More than 4 times per year    Active member of club or organization: No    Attends meetings of clubs or organizations: Never    Relationship status: Divorced  Other Topics Concern  . Not on file  Social History Narrative  . Not on file    Allergies:  Allergies  Allergen Reactions  . Vitamin B12 Other (See Comments)    interacted with other medications     Metabolic Disorder Labs: No results found for: HGBA1C, MPG No results found for:  PROLACTIN Lab Results  Component Value Date   CHOL 235 (H) 02/16/2017   TRIG 275 (H) 02/16/2017   HDL 38 (L) 02/16/2017   CHOLHDL 6.2 (H) 02/16/2017   LDLCALC 142 (H) 02/16/2017   LDLCALC 108 02/21/2012   Lab Results  Component Value Date   TSH 1.333 05/04/2016   TSH 2.01 08/13/2011    Therapeutic Level Labs: No results found for: LITHIUM No results found for: VALPROATE No components found for:  CBMZ  Current Medications: Current Outpatient Medications  Medication Sig Dispense Refill  . AMITIZA 24 MCG capsule     .  fludrocortisone (FLORINEF) 0.1 MG tablet TAKE 1 TABLET (100MCG TOTAL) BY MOUTH (TWO) TIMES DAILY (Patient taking differently: Take 0.1 mg by mouth 2 (two) times daily. ) 180 tablet 3  . lubiprostone (AMITIZA) 24 MCG capsule Take 1 capsule (24 mcg total) by mouth 2 (two) times daily with a meal. 60 capsule 6  . midodrine (PROAMATINE) 10 MG tablet TAKE 1 TABLET BY MOUTH 3 TIMES A DAY WITH MEALS (Patient taking differently: Take 10 mg by mouth 3 (three) times daily. ) 90 tablet 0  . OLANZapine (ZYPREXA) 10 MG tablet Take 1 tablet (10 mg total) by mouth at bedtime. 30 tablet 0  . PARoxetine (PAXIL) 10 MG tablet Take 1 tablet (10 mg total) by mouth daily. 30 tablet 0  . pravastatin (PRAVACHOL) 40 MG tablet TAKE 1 TABLET BY MOUTH EVERY DAY (Patient taking differently: Take 40 mg by mouth daily. ) 30 tablet 12  . pravastatin (PRAVACHOL) 40 MG tablet     . risperiDONE (RISPERDAL) 2 MG tablet Take 1 tablet (2 mg total) by mouth at bedtime. 30 tablet 0   No current facility-administered medications for this visit.      Musculoskeletal: Strength & Muscle Tone: within normal limits Gait & Station: normal Patient leans: N/A  Psychiatric Specialty Exam: Review of Systems  Psychiatric/Behavioral: The patient is not nervous/anxious.   All other systems reviewed and are negative.   There were no vitals taken for this visit.There is no height or weight on file to calculate  BMI.  General Appearance: Casual  Eye Contact:  Fair  Speech:  Clear and Coherent  Volume:  Normal  Mood:  Euthymic  Affect:  Appropriate  Thought Process:  Goal Directed and Descriptions of Associations: Intact  Orientation:  Other:  person, place,situation  Thought Content: Logical   Suicidal Thoughts:  No  Homicidal Thoughts:  No  Memory:  Immediate;   Fair Recent;   Fair Remote;   Fair  Judgement:  Fair  Insight:  Fair  Psychomotor Activity:  increased due to TD  Concentration:  Concentration: Fair and Attention Span: Fair  Recall:  [employer] of Knowledge: Fair  Language: Fair  Akathisia:  No  Handed:  Right  AIMS (if indicated): Has TD , movements around his mouth - 3  Assets:  Communication Skills Desire for Improvement Housing  ADL's:  Intact  Cognition: WNL  Sleep:  Fair   Screenings: AIMS     Office Visit from 02/23/2018 in [place] Total Score  3    PHQ2-9     Office Visit from 02/16/2017 in [place]  PHQ-2 Total Score  0  PHQ-9 Total Score  0       Assessment and Plan: Akhil is a 68 year old Caucasian male, divorced, lives in [place], [place], has a history of paranoid schizophrenia, TD was evaluated by telemedicine today.  Patient with chronic history of mental health problems currently compliant with medications.  He also has support system from sister who is the healthcare power of attorney.  Patient however today with prolonged QT C on his EKG and hence will benefit from repeat EKG as well as medication readjustment.  Plan as noted below.  Plan For prolonged QT syndrome- unstable As summarized above most recent EKG on 01/23/2019-shows prolonged QT syndrome Educated patient as well as sister about the risk of being on medications like Zyprexa, risperidone as well as Paxil which can have an impact on his QT.  Educated  them about the risk of QT prolongation including death. Discussed to contact  primary provider to get an EKG done as soon as possible. Also discussed reducing the dosages of medications.  Given the patient's chronic mental health problems and previous decompensation when medications were discontinued, will not discontinue medications abruptly however will reduce the medication dosage and will gradually monitor him for more medication dosage reduction.  This was discussed with patient as well as sister who agrees with plan.  For schizophrenia-stable His previous dosage of Zyprexa is 20 mg p.o. nightly We will reduce Zyprexa to 10 mg p.o. nightly Reduce risperidone to 2 mg p.o. nightly. Continue Paxil as prescribed for now.   For tardive dyskinesia- unstable, chronic Patient continues to have movements around his mouth. Patient was offered medications however declined.  I have communicated with Dr. Caryn Section regarding patient's need for EKG.  We will also forward this note to Dr. Caryn Section. I have discussed with patient as well as sister to make an appointment with Dr. Caryn Section as soon as possible to get an EKG and be reevaluated.  Follow-up in clinic on June 25 at 9:45 AM.  I have spent atleast 25 minutes  face to face with patient today. More than 50 % of the time was spent for psychoeducation and supportive psychotherapy and care coordination.  This note was generated in part or whole with voice recognition software. Voice recognition is usually quite accurate but there are transcription errors that can and very often do occur. I apologize for any typographical errors that were not detected and corrected.      Ursula Alert, MD 06/01/2019, 12:56 PM

## 2019-06-05 ENCOUNTER — Ambulatory Visit (INDEPENDENT_AMBULATORY_CARE_PROVIDER_SITE_OTHER): Payer: Medicare Other | Admitting: Family Medicine

## 2019-06-05 ENCOUNTER — Encounter: Payer: Self-pay | Admitting: Family Medicine

## 2019-06-05 ENCOUNTER — Other Ambulatory Visit: Payer: Self-pay

## 2019-06-05 VITALS — BP 144/86 | HR 61 | Temp 98.4°F | Resp 16 | Wt 244.0 lb

## 2019-06-05 DIAGNOSIS — N183 Chronic kidney disease, stage 3 unspecified: Secondary | ICD-10-CM

## 2019-06-05 DIAGNOSIS — E785 Hyperlipidemia, unspecified: Secondary | ICD-10-CM | POA: Diagnosis not present

## 2019-06-05 DIAGNOSIS — I950 Idiopathic hypotension: Secondary | ICD-10-CM

## 2019-06-05 DIAGNOSIS — Z79899 Other long term (current) drug therapy: Secondary | ICD-10-CM | POA: Diagnosis not present

## 2019-06-05 DIAGNOSIS — Z125 Encounter for screening for malignant neoplasm of prostate: Secondary | ICD-10-CM | POA: Diagnosis not present

## 2019-06-05 DIAGNOSIS — B360 Pityriasis versicolor: Secondary | ICD-10-CM

## 2019-06-05 DIAGNOSIS — Z86010 Personal history of colonic polyps: Secondary | ICD-10-CM

## 2019-06-05 MED ORDER — KETOCONAZOLE 200 MG PO TABS: 400.0000 mg | ORAL_TABLET | Freq: Once | ORAL | 0 refills | Status: AC

## 2019-06-05 NOTE — Progress Notes (Signed)
Patient: [name] Male    DOB: Dec 04, 1951   68 y.o.   MRN: [id number] Visit Date: 06/05/2019  Today's Provider: Lelon Huh, MD   Chief Complaint  Patient presents with  . Follow-up   Subjective:     HPI  Follow up for Abnormal EKG:  Patient reports that he spoke with his psychiatrist on 06/01/2019, and was  advised that his last EKG report was abnormal, and that he should follow up to have a repeat.   He is not having any chest pains or palpitations. No dyspnea.   He is also due for follow up of lipids and orthostatic blood pressure. He is taking medications consistently without adverse effects.   He also reports irregular dark skin pigmentation across his upper torso. Is slightly itchy, no burning or stinging  ------------------------------------------------------------------------------------  Allergies  Allergen Reactions  . Vitamin B12 Other (See Comments)    interacted with other medications      Current Outpatient Medications:  .  AMITIZA 24 MCG capsule, , Disp: , Rfl:  .  fludrocortisone (FLORINEF) 0.1 MG tablet, TAKE 1 TABLET (100MCG TOTAL) BY MOUTH (TWO) TIMES DAILY (Patient taking differently: Take 0.1 mg by mouth 2 (two) times daily. ), Disp: 180 tablet, Rfl: 3 .  lubiprostone (AMITIZA) 24 MCG capsule, Take 1 capsule (24 mcg total) by mouth 2 (two) times daily with a meal., Disp: 60 capsule, Rfl: 6 .  midodrine (PROAMATINE) 10 MG tablet, TAKE 1 TABLET BY MOUTH 3 TIMES A DAY WITH MEALS (Patient taking differently: Take 10 mg by mouth 3 (three) times daily. ), Disp: 90 tablet, Rfl: 0 .  OLANZapine (ZYPREXA) 10 MG tablet, Take 1 tablet (10 mg total) by mouth at bedtime. (Patient taking differently: Take 5 mg by mouth at bedtime. ), Disp: 30 tablet, Rfl: 0 .  PARoxetine (PAXIL) 10 MG tablet, Take 1 tablet (10 mg total) by mouth daily., Disp: 30 tablet, Rfl: 0 .  pravastatin (PRAVACHOL) 40 MG tablet, TAKE 1 TABLET BY MOUTH EVERY DAY (Patient taking  differently: Take 40 mg by mouth daily. ), Disp: 30 tablet, Rfl: 12 .  pravastatin (PRAVACHOL) 40 MG tablet, , Disp: , Rfl:  .  risperiDONE (RISPERDAL) 2 MG tablet, Take 1 tablet (2 mg total) by mouth at bedtime. (Patient taking differently: Take 1 mg by mouth at bedtime. ), Disp: 30 tablet, Rfl: 0  Review of Systems  Constitutional: Negative for appetite change, chills and fever.  Respiratory: Negative for chest tightness, shortness of breath and wheezing.   Cardiovascular: Negative for chest pain and palpitations.  Gastrointestinal: Negative for abdominal pain, nausea and vomiting.    Social History   Tobacco Use  . Smoking status: Former Smoker    Types: Cigarettes    Quit date: 08/04/2004    Years since quitting: 14.8  . Smokeless tobacco: Former Systems developer    Quit date: 08/04/2004  Substance Use Topics  . Alcohol use: No    Alcohol/week: 0.0 standard drinks      Objective:   BP (!) 144/86 (BP Location: Right Arm, Cuff Size: Large)   Pulse 61   Temp 98.4 F (36.9 C) (Oral)   Resp 16   Wt 244 lb (110.7 kg)   SpO2 99% Comment: room air  BMI 33.09 kg/m  Vitals:   06/05/19 1452 06/05/19 1458  BP: (!) 142/90 (!) 144/86  Pulse: 61   Resp: 16   Temp: 98.4 F (36.9 C)   TempSrc: Oral  SpO2: 99%   Weight: 244 lb (110.7 kg)      Physical Exam    General Appearance:    Alert, cooperative, no distress  Eyes:    PERRL, conjunctiva/corneas clear, EOM's intact       Lungs:     Clear to auscultation bilaterally, respirations unlabored  Heart:    Regular rate and rhythm  Skin:   Irregular patches or darker skin pigmentation as described in hpi consistent with tinea versicolor.        Normal EKG    Assessment & Plan    1. High risk medication use Normal EKG. May continue current medications.  - EKG 12-Lead  2. Idiopathic hypotension Fairly well controlled on current medications.   3. CKD (chronic kidney disease) stage 3, GFR 30-59 ml/min (HCC) Check labs as below.    4. Tinea versicolor  - ketoconazole (NIZORAL) 200 MG tablet; Take 2 tablets (400 mg total) by mouth once for 1 dose.  Dispense: 2 tablet; Refill: 0  5. Prostate cancer screening  - PSA  6. Hyperlipidemia, unspecified hyperlipidemia type He is tolerating simvastatin well with no adverse effects.   - Lipid panel - Comprehensive metabolic panel - TSH  7. History of adenomatous polyp of colon Had adenomatous polyps on last colonoscopy by Dr. Tiffany Kocher 11/2009. Is overdue for follow up.  - Ambulatory referral to Gastroenterology       Lelon Huh, MD  [facility]

## 2019-06-05 NOTE — Patient Instructions (Signed)
.   Please review the attached list of medications and notify my office if there are any errors.   . Please bring all of your medications to every appointment so we can make sure that our medication list is the same as yours.   

## 2019-06-07 ENCOUNTER — Telehealth: Payer: Self-pay

## 2019-06-07 ENCOUNTER — Other Ambulatory Visit: Payer: Self-pay

## 2019-06-07 ENCOUNTER — Ambulatory Visit (INDEPENDENT_AMBULATORY_CARE_PROVIDER_SITE_OTHER): Payer: Medicare Other | Admitting: Psychiatry

## 2019-06-07 ENCOUNTER — Encounter: Payer: Self-pay | Admitting: Psychiatry

## 2019-06-07 DIAGNOSIS — R9431 Abnormal electrocardiogram [ECG] [EKG]: Secondary | ICD-10-CM | POA: Insufficient documentation

## 2019-06-07 DIAGNOSIS — G2401 Drug induced subacute dyskinesia: Secondary | ICD-10-CM | POA: Diagnosis not present

## 2019-06-07 DIAGNOSIS — F200 Paranoid schizophrenia: Secondary | ICD-10-CM | POA: Diagnosis not present

## 2019-06-07 MED ORDER — OLANZAPINE 10 MG PO TABS: 10.0000 mg | ORAL_TABLET | Freq: Every day | ORAL | 0 refills | Status: DC

## 2019-06-07 MED ORDER — RISPERIDONE 2 MG PO TABS: 2.0000 mg | ORAL_TABLET | Freq: Every day | ORAL | 0 refills | Status: DC

## 2019-06-07 MED ORDER — PAROXETINE HCL 10 MG PO TABS: 10.0000 mg | ORAL_TABLET | Freq: Every day | ORAL | 0 refills | Status: DC

## 2019-06-07 NOTE — Progress Notes (Signed)
Virtual Visit via Video Note  I connected with [name] on 06/07/19 at  9:45 AM EDT by a video enabled telemedicine application and verified that I am speaking with the correct person using two identifiers.   I discussed the limitations of evaluation and management by telemedicine and the availability of in person appointments. The patient expressed understanding and agreed to proceed.   I discussed the assessment and treatment plan with the patient. The patient was provided an opportunity to ask questions and all were answered. The patient agreed with the plan and demonstrated an understanding of the instructions.   The patient was advised to call back or seek an in-person evaluation if the symptoms worsen or if the condition fails to improve as anticipated.   [place] MD OP Progress Note  06/07/2019 1:46 PM [name]  MRN:  [id number]  Chief Complaint:  Chief Complaint    Follow-up     HPI: [name] is a 68 year old Caucasian male, divorced, lives in [name], has a history of schizophrenia, tardive dyskinesia was evaluated by telemedicine today.  Patient had trouble connecting by video and hence during the session it had to be changed to a phone call.  Collateral information was obtained from his sister Ms. [name] who is the healthcare power of attorney.  Patient today reports he is doing well.  He denies any significant problems from reducing the dosage of his antipsychotic medications.  Patient appeared to be alert, oriented to person place time and situation.  He denied any perceptual disturbances.  He denied any mood lability.  He denied any sleep problems.  Per sister-Ms. [name]-patient continues to do well in spite of the dosage reduction.  Patient had EKG done by his primary medical doctor Dr. Caryn Section.  They were told his EKG was within normal limits.  Per review of notes per Dr. Caryn Section.  06/05/2019-' patient with normal EKG-normal sinus rhythm, otherwise normal, left  axis deviation, it is okay to continue the current medications.'  Visit Diagnosis:    ICD-10-CM   1. Paranoid schizophrenia ([place])  F20.0 OLANZapine (ZYPREXA) 10 MG tablet    risperiDONE (RISPERDAL) 2 MG tablet    PARoxetine (PAXIL) 10 MG tablet  2. Prolonged Q-T interval on ECG  R94.31   3. Tardive dyskinesia  G24.01     Past Psychiatric History: Reviewed past psychiatric history from my progress note on 02/23/2018.  Past trials of Haldol, Klonopin, Seroquel  Past Medical History:  Past Medical History:  Diagnosis Date  . Chicken pox   . Dizziness   . Measles   . Mumps   . Schizophrenia [facility])     Past Surgical History:  Procedure Laterality Date  . ABDOMINAL SURGERY    . COLON SURGERY    . LAPAROTOMY N/A 03/20/2016   Procedure: EXPLORATORY LAPAROTOMY with lysis of adhesions;  Surgeon: Jules Husbands, MD;  Location: [facility] ORS;  Service: General;  Laterality: N/A;  . None      Family Psychiatric History: Reviewed family psychiatric history from my progress note on 02/23/2018.  Family History:  Family History  Problem Relation Age of Onset  . Diabetes Mother   . Heart disease Mother   . Stroke Mother   . Diabetes Father   . Hypertension Father   . Lung cancer Father   . Cancer Father        lung cancer  . Schizophrenia Sister   . Lung cancer Sister   . Colon cancer Sister  Social History: Reviewed social history from my progress note on 02/23/2018 Social History   Socioeconomic History  . Marital status: Divorced    Spouse name: Not on file  . Number of children: 2  . Years of education: Not on file  . Highest education level: High school graduate  Occupational History  . Occupation: Disabled  Social Needs  . Financial resource strain: Not hard at all  . Food insecurity    Worry: Never true    Inability: Never true  . Transportation needs    Medical: No    Non-medical: No  Tobacco Use  . Smoking status: Former Smoker    Types: Cigarettes    Quit date:  08/04/2004    Years since quitting: 14.8  . Smokeless tobacco: Former Systems developer    Quit date: 08/04/2004  Substance and Sexual Activity  . Alcohol use: No    Alcohol/week: 0.0 standard drinks  . Drug use: No  . Sexual activity: Not Currently  Lifestyle  . Physical activity    Days per week: 0 days    Minutes per session: 0 min  . Stress: Not on file  Relationships  . Social connections    Talks on phone: More than three times a week    Gets together: More than three times a week    Attends religious service: More than 4 times per year    Active member of club or organization: No    Attends meetings of clubs or organizations: Never    Relationship status: Divorced  Other Topics Concern  . Not on file  Social History Narrative  . Not on file    Allergies:  Allergies  Allergen Reactions  . Vitamin B12 Other (See Comments)    interacted with other medications     Metabolic Disorder Labs: No results found for: HGBA1C, MPG No results found for: PROLACTIN Lab Results  Component Value Date   CHOL 235 (H) 02/16/2017   TRIG 275 (H) 02/16/2017   HDL 38 (L) 02/16/2017   CHOLHDL 6.2 (H) 02/16/2017   LDLCALC 142 (H) 02/16/2017   LDLCALC 108 02/21/2012   Lab Results  Component Value Date   TSH 1.333 05/04/2016   TSH 2.01 08/13/2011    Therapeutic Level Labs: No results found for: LITHIUM No results found for: VALPROATE No components found for:  CBMZ  Current Medications: Current Outpatient Medications  Medication Sig Dispense Refill  . AMITIZA 24 MCG capsule     . fludrocortisone (FLORINEF) 0.1 MG tablet TAKE 1 TABLET (100MCG TOTAL) BY MOUTH (TWO) TIMES DAILY (Patient taking differently: Take 0.1 mg by mouth 2 (two) times daily. ) 180 tablet 3  . lubiprostone (AMITIZA) 24 MCG capsule Take 1 capsule (24 mcg total) by mouth 2 (two) times daily with a meal. 60 capsule 6  . midodrine (PROAMATINE) 10 MG tablet TAKE 1 TABLET BY MOUTH 3 TIMES A DAY WITH MEALS (Patient taking  differently: Take 10 mg by mouth 3 (three) times daily. ) 90 tablet 0  . OLANZapine (ZYPREXA) 10 MG tablet Take 1 tablet (10 mg total) by mouth at bedtime. 90 tablet 0  . PARoxetine (PAXIL) 10 MG tablet Take 1 tablet (10 mg total) by mouth daily. 90 tablet 0  . pravastatin (PRAVACHOL) 40 MG tablet TAKE 1 TABLET BY MOUTH EVERY DAY (Patient taking differently: Take 40 mg by mouth daily. ) 30 tablet 12  . pravastatin (PRAVACHOL) 40 MG tablet     . risperiDONE (RISPERDAL) 2 MG tablet  Take 1 tablet (2 mg total) by mouth at bedtime. 90 tablet 0   No current facility-administered medications for this visit.      Musculoskeletal: Strength & Muscle Tone: UTA Gait & Station: Reports as WNL Patient leans: N/A  Psychiatric Specialty Exam: Review of Systems  Psychiatric/Behavioral: Negative for depression, hallucinations, substance abuse and suicidal ideas. The patient is not nervous/anxious and does not have insomnia.   All other systems reviewed and are negative.   There were no vitals taken for this visit.There is no height or weight on file to calculate BMI.  General Appearance: UTA  Eye Contact:  UTA  Speech:  Normal Rate  Volume:  Normal  Mood:  Euthymic  Affect:  UTA  Thought Process:  Goal Directed and Descriptions of Associations: Intact  Orientation:  Full (Time, Place, and Person)  Thought Content: Logical   Suicidal Thoughts:  No  Homicidal Thoughts:  No  Memory:  Immediate;   Fair Recent;   Fair Remote;   Fair  Judgement:  Fair  Insight:  Fair  Psychomotor Activity:  UTA  Concentration:  Concentration: Fair and Attention Span: Fair  Recall:  [employer] of Knowledge: Fair  Language: Fair  Akathisia:  No  Handed:  Right  AIMS (if indicated): UTA  Assets:  Communication Skills Desire for Improvement Housing Social Support  ADL's:  Intact  Cognition: WNL  Sleep:  Fair   Screenings: AIMS     Office Visit from 02/23/2018 in [place] [facility] Total Score  3    PHQ2-9     Office Visit from 02/16/2017 in [place]  PHQ-2 Total Score  0  PHQ-9 Total Score  0       Assessment and Plan: Farron is a 68 year old Caucasian male, divorced, lives in [place], on [place], has a history of paranoid schizophrenia, TD was evaluated by telemedicine today.  Patient with history of mental health problems, recently with prolonged QTC on EKG.  Patient's medications were hence reduced.  Patient today appears to be doing well.  And his most recent EKG per report from primary medical doctor and review of his notes are within normal limits.  Plan as noted below.  Plan For schizophrenia-stable Continue reduced dosage of Zyprexa 10 mg p.o. nightly Risperidone at reduced dosage of 2 mg p.o. nightly Continue Paxil as prescribed for now. Patient is currently doing well on the current reduced dosage.  Will monitor closely. Patient as well as sister aware about the risk of being on 2 antipsychotic medications.  The long-term goal is to taper off to monotherapy.  For prolonged QTC on EKG- most recent EKG on 06/05/2019-per review of notes per Dr. Ludwig Lean normal limits. I have requested a copy of EKG for review, CMA to contact medical records with patient consent.  For TD- unstable, chronic Patient declined medications.  Follow-up in clinic in 2 to 4 weeks or sooner if needed.  July 31 at 9:45 AM  I have spent atleast 25 minutes non face to face with patient today. More than 50 % of the time was spent for psychoeducation and supportive psychotherapy and care coordination.  This note was generated in part or whole with voice recognition software. Voice recognition is usually quite accurate but there are transcription errors that can and very often do occur. I apologize for any typographical errors that were not detected and corrected.        Ursula Alert, MD 06/07/2019, 1:46 PM

## 2019-06-07 NOTE — Telephone Encounter (Signed)
[facility] psychiatric department called requesting a copy of patients recent EKG. Fax [phone]. Phone  [phone].

## 2019-06-08 NOTE — Telephone Encounter (Signed)
Bradley Kelp do you have this in the pile to be scanned?

## 2019-06-14 DIAGNOSIS — E785 Hyperlipidemia, unspecified: Secondary | ICD-10-CM | POA: Diagnosis not present

## 2019-06-15 LAB — COMPREHENSIVE METABOLIC PANEL
ALT: 17 IU/L (ref 0–44)
AST: 20 IU/L (ref 0–40)
Albumin/Globulin Ratio: 1.6 (ref 1.2–2.2)
Albumin: 4.1 g/dL (ref 3.8–4.8)
Alkaline Phosphatase: 103 IU/L (ref 39–117)
BUN/Creatinine Ratio: 10 (ref 10–24)
BUN: 14 mg/dL (ref 8–27)
Bilirubin Total: 0.3 mg/dL (ref 0.0–1.2)
CO2: 23 mmol/L (ref 20–29)
Calcium: 9.1 mg/dL (ref 8.6–10.2)
Chloride: 109 mmol/L — ABNORMAL HIGH (ref 96–106)
Creatinine, Ser: 1.4 mg/dL — ABNORMAL HIGH (ref 0.76–1.27)
GFR calc Af Amer: 59 mL/min/{1.73_m2} — ABNORMAL LOW (ref >59)
GFR calc non Af Amer: 51 mL/min/{1.73_m2} — ABNORMAL LOW (ref >59)
Globulin, Total: 2.6 g/dL (ref 1.5–4.5)
Glucose: 97 mg/dL (ref 65–99)
Potassium: 4 mmol/L (ref 3.5–5.2)
Sodium: 146 mmol/L — ABNORMAL HIGH (ref 134–144)
Total Protein: 6.7 g/dL (ref 6.0–8.5)

## 2019-06-15 LAB — LIPID PANEL
Chol/HDL Ratio: 4 ratio (ref 0.0–5.0)
Cholesterol, Total: 155 mg/dL (ref 100–199)
HDL: 39 mg/dL — ABNORMAL LOW (ref >39)
LDL Calculated: 91 mg/dL (ref 0–99)
Triglycerides: 123 mg/dL (ref 0–149)
VLDL Cholesterol Cal: 25 mg/dL (ref 5–40)

## 2019-06-15 LAB — PSA: Prostate Specific Ag, Serum: 0.4 ng/mL (ref 0.0–4.0)

## 2019-06-15 LAB — TSH: TSH: 2.42 u[IU]/mL (ref 0.450–4.500)

## 2019-06-18 ENCOUNTER — Ambulatory Visit: Payer: Medicare Other | Admitting: Psychiatry

## 2019-06-25 ENCOUNTER — Telehealth: Payer: Self-pay | Admitting: Family Medicine

## 2019-06-25 NOTE — Chronic Care Management (AMB) (Signed)
Chronic Care Management   Note  06/25/2019 Name: JERELD PRESTI MRN: [id number] DOB: 1951-02-12  Adah Salvage Leighty is a 68 y.o. year old male who is a primary care patient of Fisher, Kirstie Peri, MD. I reached out to [name] by phone today in response to a referral sent by Mr. Shakir Petrosino Charnley's health plan.    Mr. Pickrell was given information about Chronic Care Management services today including:  1. CCM service includes personalized support from designated clinical staff supervised by his physician, including individualized plan of care and coordination with other care providers 2. 24/7 contact phone numbers for assistance for urgent and routine care needs. 3. Service will only be billed when office clinical staff spend 20 minutes or more in a month to coordinate care. 4. Only one practitioner may furnish and bill the service in a calendar month. 5. The patient may stop CCM services at any time (effective at the end of the month) by phone call to the office staff. 6. The patient will be responsible for cost sharing (co-pay) of up to 20% of the service fee (after annual deductible is met).  Patients wife [name] did not agree to enrollment in care management services and does not wish to consider at this time.  Follow up plan: [name] has been provided with contact information for the chronic care management team and has been advised to call with any health related questions or concerns.   [place]  [email or web address]   ??[phone]

## 2019-07-03 ENCOUNTER — Other Ambulatory Visit: Payer: Self-pay

## 2019-07-03 ENCOUNTER — Ambulatory Visit: Payer: Medicare Other | Admitting: Licensed Clinical Social Worker

## 2019-07-04 ENCOUNTER — Other Ambulatory Visit: Payer: Self-pay | Admitting: Psychiatry

## 2019-07-04 NOTE — Telephone Encounter (Signed)
He should have 90 days of medication according to Epic.

## 2019-07-04 NOTE — Telephone Encounter (Signed)
According to Epic, Dr. Shea Evans ordered 90 days in June. Please verify it with the pharmacy.

## 2019-07-04 NOTE — Telephone Encounter (Signed)
Received fax requesting a refill on risperidone.    risperiDONE (RISPERDAL) 2 MG tablet Medication Date: 06/07/2019 Department: [facility] Ordering/Authorizing: Ursula Alert, MD  Order Providers  Prescribing Provider Encounter Provider  Ursula Alert, MD Ursula Alert, MD  Outpatient Medication Detail   Disp Refills Start End   risperiDONE (RISPERDAL) 2 MG tablet 90 tablet 0 06/07/2019    Sig - Route: Take 1 tablet (2 mg total) by mouth at bedtime. - Oral   Sent to pharmacy as: risperiDONE (RISPERDAL) 2 MG tablet   E-Prescribing Status: Receipt confirmed by pharmacy (06/07/2019 9:56 AM EDT)   Associated Diagnoses  Paranoid schizophrenia [facility]) - [place], [place]

## 2019-07-04 NOTE — Telephone Encounter (Signed)
received a fax for olanzapine 20mg    OLANZapine (ZYPREXA) 10 MG tablet Medication Date: 06/07/2019 Department: [facility] Ordering/Authorizing: Ursula Alert, MD  Order Providers  Prescribing Provider Encounter Provider  Ursula Alert, MD Ursula Alert, MD  Outpatient Medication Detail   Disp Refills Start End   OLANZapine (ZYPREXA) 10 MG tablet 90 tablet 0 06/07/2019    Sig - Route: Take 1 tablet (10 mg total) by mouth at bedtime. - Oral   Sent to pharmacy as: OLANZapine (ZYPREXA) 10 MG tablet   E-Prescribing Status: Receipt confirmed by pharmacy (06/07/2019 9:56 AM EDT)   Associated Diagnoses  Paranoid schizophrenia [facility]) - [place], [place]

## 2019-07-09 ENCOUNTER — Other Ambulatory Visit: Payer: Self-pay | Admitting: Psychiatry

## 2019-07-12 NOTE — Telephone Encounter (Signed)
Pt needs refills on medications. 

## 2019-07-13 ENCOUNTER — Encounter: Payer: Self-pay | Admitting: Psychiatry

## 2019-07-13 ENCOUNTER — Other Ambulatory Visit: Payer: Self-pay

## 2019-07-13 ENCOUNTER — Other Ambulatory Visit: Payer: Self-pay | Admitting: Family Medicine

## 2019-07-13 ENCOUNTER — Ambulatory Visit (INDEPENDENT_AMBULATORY_CARE_PROVIDER_SITE_OTHER): Payer: Medicare Other | Admitting: Psychiatry

## 2019-07-13 DIAGNOSIS — I951 Orthostatic hypotension: Secondary | ICD-10-CM

## 2019-07-13 DIAGNOSIS — G2401 Drug induced subacute dyskinesia: Secondary | ICD-10-CM | POA: Diagnosis not present

## 2019-07-13 DIAGNOSIS — F200 Paranoid schizophrenia: Secondary | ICD-10-CM | POA: Diagnosis not present

## 2019-07-13 DIAGNOSIS — R9431 Abnormal electrocardiogram [ECG] [EKG]: Secondary | ICD-10-CM | POA: Diagnosis not present

## 2019-07-13 MED ORDER — MIDODRINE HCL 10 MG PO TABS: 10.0000 mg | ORAL_TABLET | Freq: Three times a day (TID) | ORAL | 4 refills | Status: DC

## 2019-07-13 NOTE — Telephone Encounter (Signed)
Pt needs a refill on   Midodrine 10 mg  90 days  [place]

## 2019-07-13 NOTE — Progress Notes (Signed)
Virtual Visit via Telephone Note  I connected with [name] on 07/13/19 at  9:00 AM EDT by telephone and verified that I am speaking with the correct person using two identifiers.   I discussed the limitations, risks, security and privacy concerns of performing an evaluation and management service by telephone and the availability of in person appointments. I also discussed with the patient that there may be a patient responsible charge related to this service. The patient expressed understanding and agreed to proceed.   I discussed the assessment and treatment plan with the patient. The patient was provided an opportunity to ask questions and all were answered. The patient agreed with the plan and demonstrated an understanding of the instructions.   The patient was advised to call back or seek an in-person evaluation if the symptoms worsen or if the condition fails to improve as anticipated.   [facility] MD OP Progress Note  07/13/2019 1:09 PM [name]  MRN:  [id number]  Chief Complaint:  Chief Complaint    Follow-up     HPI: [name] is a 68 year old Caucasian male, divorced, lives in [place], has a history of schizophrenia, tardive dyskinesia, was evaluated by telemedicine today.  Patient preferred to do a phone call.  Patient today reports he is currently doing well on the current medication regimen.  He denies any side effects.  He is tolerating the reduced dosage of his antipsychotic medications.  He denies any perceptual disturbances at this time.  He reports sleep is good.  Patient reports he tries to stay safe from the COVID-19 and so far has been doing well.  His sister [name] provided collateral information , she is the healthcare power of attorney for patient. Per sister patient is currently doing well on the current medication regimen.   Visit Diagnosis:    ICD-10-CM   1. Paranoid schizophrenia ([facility] [place])  F20.0   2. Tardive dyskinesia  G24.01   3. Prolonged Q-T  interval on ECG  R94.31     Past Psychiatric History: I have reviewed past psychiatric history from my progress note on 02/23/2018.  Past trials of Haldol, Klonopin, Seroquel.  Past Medical History:  Past Medical History:  Diagnosis Date  . Chicken pox   . Dizziness   . Measles   . Mumps   . Schizophrenia [facility])     Past Surgical History:  Procedure Laterality Date  . ABDOMINAL SURGERY    . COLON SURGERY    . LAPAROTOMY N/A 03/20/2016   Procedure: EXPLORATORY LAPAROTOMY with lysis of adhesions;  Surgeon: Jules Husbands, MD;  Location: [facility] ORS;  Service: General;  Laterality: N/A;  . None      Family Psychiatric History: I have reviewed family psychiatric history from my progress note on 02/23/2018  Family History:  Family History  Problem Relation Age of Onset  . Diabetes Mother   . Heart disease Mother   . Stroke Mother   . Diabetes Father   . Hypertension Father   . Lung cancer Father   . Cancer Father        lung cancer  . Schizophrenia Sister   . Lung cancer Sister   . Colon cancer Sister     Social History: I have reviewed social history from my progress note on 02/24/2028 Social History   Socioeconomic History  . Marital status: Divorced    Spouse name: Not on file  . Number of children: 2  . Years of education: Not on file  .  Highest education level: High school graduate  Occupational History  . Occupation: Disabled  Social Needs  . Financial resource strain: Not hard at all  . Food insecurity    Worry: Never true    Inability: Never true  . Transportation needs    Medical: No    Non-medical: No  Tobacco Use  . Smoking status: Former Smoker    Types: Cigarettes    Quit date: 08/04/2004    Years since quitting: 14.9  . Smokeless tobacco: Former Systems developer    Quit date: 08/04/2004  Substance and Sexual Activity  . Alcohol use: No    Alcohol/week: 0.0 standard drinks  . Drug use: No  . Sexual activity: Not Currently  Lifestyle  . Physical activity     Days per week: 0 days    Minutes per session: 0 min  . Stress: Not on file  Relationships  . Social connections    Talks on phone: More than three times a week    Gets together: More than three times a week    Attends religious service: More than 4 times per year    Active member of club or organization: No    Attends meetings of clubs or organizations: Never    Relationship status: Divorced  Other Topics Concern  . Not on file  Social History Narrative  . Not on file    Allergies:  Allergies  Allergen Reactions  . Vitamin B12 Other (See Comments)    interacted with other medications     Metabolic Disorder Labs: No results found for: HGBA1C, MPG No results found for: PROLACTIN Lab Results  Component Value Date   CHOL 155 06/14/2019   TRIG 123 06/14/2019   HDL 39 (L) 06/14/2019   CHOLHDL 4.0 06/14/2019   LDLCALC 91 06/14/2019   LDLCALC 142 (H) 02/16/2017   Lab Results  Component Value Date   TSH 2.420 06/14/2019   TSH 1.333 05/04/2016    Therapeutic Level Labs: No results found for: LITHIUM No results found for: VALPROATE No components found for:  CBMZ  Current Medications: Current Outpatient Medications  Medication Sig Dispense Refill  . AMITIZA 24 MCG capsule     . fludrocortisone (FLORINEF) 0.1 MG tablet TAKE 1 TABLET (100MCG TOTAL) BY MOUTH (TWO) TIMES DAILY (Patient taking differently: Take 0.1 mg by mouth 2 (two) times daily. ) 180 tablet 3  . ketoconazole (NIZORAL) 200 MG tablet     . lubiprostone (AMITIZA) 24 MCG capsule Take 1 capsule (24 mcg total) by mouth 2 (two) times daily with a meal. 60 capsule 6  . midodrine (PROAMATINE) 10 MG tablet Take 1 tablet (10 mg total) by mouth 3 (three) times daily. 90 tablet 4  . OLANZapine (ZYPREXA) 10 MG tablet Take 1 tablet (10 mg total) by mouth at bedtime. 90 tablet 0  . PARoxetine (PAXIL) 10 MG tablet Take 1 tablet (10 mg total) by mouth daily. 90 tablet 0  . pravastatin (PRAVACHOL) 40 MG tablet TAKE 1 TABLET  BY MOUTH EVERY DAY (Patient taking differently: Take 40 mg by mouth daily. ) 30 tablet 12  . pravastatin (PRAVACHOL) 40 MG tablet     . risperiDONE (RISPERDAL) 2 MG tablet Take 1 tablet (2 mg total) by mouth at bedtime. 90 tablet 0   No current facility-administered medications for this visit.      Musculoskeletal: Strength & Muscle Tone: UTA Gait & Station: normal Patient leans: N/A  Psychiatric Specialty Exam: Review of Systems  Psychiatric/Behavioral: The patient  is not nervous/anxious.   All other systems reviewed and are negative.   There were no vitals taken for this visit.There is no height or weight on file to calculate BMI.  General Appearance: UTA  Eye Contact:  UTA  Speech:  Clear and Coherent  Volume:  Normal  Mood:  Euthymic  Affect:  UTA  Thought Process:  Goal Directed and Descriptions of Associations: Intact  Orientation:  Full (Time, Place, and Person)  Thought Content: Logical   Suicidal Thoughts:  No  Homicidal Thoughts:  No  Memory:  Immediate;   Fair Recent;   Fair Remote;   Fair  Judgement:  Fair  Insight:  Fair  Psychomotor Activity:  UTA  Concentration:  Concentration: Fair and Attention Span: Fair  Recall:  [employer] of Knowledge: Fair  Language: Fair  Akathisia:  No  Handed:  Right  AIMS (if indicated): Denies tremors, rigidity  Assets:  Communication Skills Desire for Improvement Housing Social Support  ADL's:  Intact  Cognition: WNL  Sleep:  Fair   Screenings: AIMS     Office Visit from 02/23/2018 in [place] Total Score  3    PHQ2-9     Office Visit from 02/16/2017 in [place]  PHQ-2 Total Score  0  PHQ-9 Total Score  0       Assessment and Plan: [name] is a 68 year old Caucasian male, divorced, lives in [place], on [place], has a history of paranoid schizophrenia, TD was evaluated by telemedicine today.  Patient is currently doing well on the current medication regimen.   Plan as noted below.  Plan For schizophrenia-stable Continue reduced dosage of Zyprexa 10 mg p.o. nightly His dosage of risperidone 2 mg p.o. nightly. Paxil as prescribed for now. Patient is currently on 2 antipsychotic medication, patient as well as sister aware of the risk of being on multiple antipsychotics.  We will continue to monitor him closely.   For prolonged QTC on EKG-currently resolved.  I have reviewed most recent EKG on 06/05/2019-within normal limits.  For TD- unstable, chronic Patient declined medications.  Follow-up in clinic in 2 months or sooner if needed.  October 6 at 10 AM  I have spent atleast 15 minutes non face to face with patient today. More than 50 % of the time was spent for psychoeducation and supportive psychotherapy and care coordination.  This note was generated in part or whole with voice recognition software. Voice recognition is usually quite accurate but there are transcription errors that can and very often do occur. I apologize for any typographical errors that were not detected and corrected.     Ursula Alert, MD 07/13/2019, 1:09 PM

## 2019-07-16 ENCOUNTER — Other Ambulatory Visit: Payer: Self-pay | Admitting: Family Medicine

## 2019-07-16 DIAGNOSIS — I951 Orthostatic hypotension: Secondary | ICD-10-CM

## 2019-07-23 DIAGNOSIS — R2231 Localized swelling, mass and lump, right upper limb: Secondary | ICD-10-CM | POA: Diagnosis not present

## 2019-07-30 NOTE — Telephone Encounter (Signed)
Faxed ekg.

## 2019-07-30 NOTE — Telephone Encounter (Signed)
The EKG that was done on 06/05/2019 has been scanned into pt's chart. Thanks TNP

## 2019-08-09 DIAGNOSIS — R2231 Localized swelling, mass and lump, right upper limb: Secondary | ICD-10-CM | POA: Diagnosis not present

## 2019-08-09 DIAGNOSIS — D2111 Benign neoplasm of connective and other soft tissue of right upper limb, including shoulder: Secondary | ICD-10-CM | POA: Diagnosis not present

## 2019-08-09 DIAGNOSIS — L720 Epidermal cyst: Secondary | ICD-10-CM | POA: Diagnosis not present

## 2019-08-15 ENCOUNTER — Other Ambulatory Visit: Payer: Self-pay | Admitting: Family Medicine

## 2019-09-11 ENCOUNTER — Telehealth: Payer: Self-pay

## 2019-09-11 NOTE — Telephone Encounter (Signed)
pt sister called states that since you went down on his medication she has seen a big difference in him changes .  she like for you to call her in regards to her concerns.

## 2019-09-11 NOTE — Telephone Encounter (Signed)
Attempted to call patient - sister  Unable to reach. Voicemail not set up to leave message.

## 2019-09-18 ENCOUNTER — Encounter: Payer: Self-pay | Admitting: Psychiatry

## 2019-09-18 ENCOUNTER — Other Ambulatory Visit: Payer: Self-pay

## 2019-09-18 ENCOUNTER — Ambulatory Visit: Payer: Medicare Other | Admitting: Psychiatry

## 2019-09-18 ENCOUNTER — Ambulatory Visit (INDEPENDENT_AMBULATORY_CARE_PROVIDER_SITE_OTHER): Payer: Medicare Other | Admitting: Psychiatry

## 2019-09-18 DIAGNOSIS — F200 Paranoid schizophrenia: Secondary | ICD-10-CM

## 2019-09-18 DIAGNOSIS — Z87898 Personal history of other specified conditions: Secondary | ICD-10-CM | POA: Diagnosis not present

## 2019-09-18 DIAGNOSIS — G2401 Drug induced subacute dyskinesia: Secondary | ICD-10-CM | POA: Diagnosis not present

## 2019-09-18 MED ORDER — OXCARBAZEPINE 150 MG PO TABS: 150.0000 mg | ORAL_TABLET | Freq: Two times a day (BID) | ORAL | 1 refills | Status: DC

## 2019-09-18 NOTE — Progress Notes (Signed)
Virtual Visit via Telephone Note  I connected with [name] on 09/18/19 at 10:00 AM EDT by telephone and verified that I am speaking with the correct person using two identifiers.   I discussed the limitations, risks, security and privacy concerns of performing an evaluation and management service by telephone and the availability of in person appointments. I also discussed with the patient that there may be a patient responsible charge related to this service. The patient expressed understanding and agreed to proceed.   I discussed the assessment and treatment plan with the patient. The patient was provided an opportunity to ask questions and all were answered. The patient agreed with the plan and demonstrated an understanding of the instructions.   The patient was advised to call back or seek an in-person evaluation if the symptoms worsen or if the condition fails to improve as anticipated.   [place] MD OP Progress Note  09/18/2019 11:36 AM ARK NAZARENO  MRN:  [id number]  Chief Complaint:  Chief Complaint    Follow-up     HPI: [name] is a 68 year old Caucasian male, divorced, lives in [place], has a history of schizophrenia, tardive dyskinesia was evaluated by telemedicine today.  Patient preferred to do a phone call.  Patient today appeared to be cooperative, calm and pleasant.  He was able to answer questions in short phrases.  He reports mood as okay.  He denies any perceptual disturbances.  He denied any suicidality, homicidality.  He reported that he is taking his medications as scheduled.  He reports sleep is good.  He reports appetite is fair.  Collateral information obtained from sister-[name] -who reports that patient has been having some episodes of irritability on and off.  She reports he seems to be angry at times which makes her kind of nervous.  He however has not done anything to hurt her.  She wonders whether his medications needs to be readjusted.  She reports  physically he is doing okay.     Visit Diagnosis:    ICD-10-CM   1. Paranoid schizophrenia ([facility])  F20.0 OXcarbazepine (TRILEPTAL) 150 MG tablet  2. Tardive dyskinesia  G24.01   3. History of prolonged Q-T interval on ECG  Z87.898     Past Psychiatric History: I have reviewed past psychiatric history from my progress note on 02/23/2018.  Past trials of Haldol, Klonopin, Seroquel  Past Medical History:  Past Medical History:  Diagnosis Date  . Chicken pox   . Dizziness   . Measles   . Mumps   . Schizophrenia [facility])     Past Surgical History:  Procedure Laterality Date  . ABDOMINAL SURGERY    . COLON SURGERY    . LAPAROTOMY N/A 03/20/2016   Procedure: EXPLORATORY LAPAROTOMY with lysis of adhesions;  Surgeon: Jules Husbands, MD;  Location: [facility] ORS;  Service: General;  Laterality: N/A;  . None      Family Psychiatric History: I have reviewed family psychiatric history from my progress note on 02/23/2018  Family History:  Family History  Problem Relation Age of Onset  . Diabetes Mother   . Heart disease Mother   . Stroke Mother   . Diabetes Father   . Hypertension Father   . Lung cancer Father   . Cancer Father        lung cancer  . Schizophrenia Sister   . Lung cancer Sister   . Colon cancer Sister     Social History: I have reviewed social history from  my progress note on 02/23/2018 Social History   Socioeconomic History  . Marital status: Divorced    Spouse name: Not on file  . Number of children: 2  . Years of education: Not on file  . Highest education level: High school graduate  Occupational History  . Occupation: Disabled  Social Needs  . Financial resource strain: Not hard at all  . Food insecurity    Worry: Never true    Inability: Never true  . Transportation needs    Medical: No    Non-medical: No  Tobacco Use  . Smoking status: Former Smoker    Types: Cigarettes    Quit date: 08/04/2004    Years since quitting: 15.1  . Smokeless tobacco: Former  Systems developer    Quit date: 08/04/2004  Substance and Sexual Activity  . Alcohol use: No    Alcohol/week: 0.0 standard drinks  . Drug use: No  . Sexual activity: Not Currently  Lifestyle  . Physical activity    Days per week: 0 days    Minutes per session: 0 min  . Stress: Not on file  Relationships  . Social connections    Talks on phone: More than three times a week    Gets together: More than three times a week    Attends religious service: More than 4 times per year    Active member of club or organization: No    Attends meetings of clubs or organizations: Never    Relationship status: Divorced  Other Topics Concern  . Not on file  Social History Narrative  . Not on file    Allergies:  Allergies  Allergen Reactions  . Vitamin B12 Other (See Comments)    interacted with other medications     Metabolic Disorder Labs: No results found for: HGBA1C, MPG No results found for: PROLACTIN Lab Results  Component Value Date   CHOL 155 06/14/2019   TRIG 123 06/14/2019   HDL 39 (L) 06/14/2019   CHOLHDL 4.0 06/14/2019   LDLCALC 91 06/14/2019   LDLCALC 142 (H) 02/16/2017   Lab Results  Component Value Date   TSH 2.420 06/14/2019   TSH 1.333 05/04/2016    Therapeutic Level Labs: No results found for: LITHIUM No results found for: VALPROATE No components found for:  CBMZ  Current Medications: Current Outpatient Medications  Medication Sig Dispense Refill  . AMITIZA 24 MCG capsule     . cephALEXin (KEFLEX) 500 MG capsule     . fludrocortisone (FLORINEF) 0.1 MG tablet TAKE 1 TABLET (100MCG TOTAL) BY MOUTH (TWO) TIMES DAILY (Patient taking differently: Take 0.1 mg by mouth 2 (two) times daily. ) 180 tablet 3  . ketoconazole (NIZORAL) 200 MG tablet     . lubiprostone (AMITIZA) 24 MCG capsule Take 1 capsule (24 mcg total) by mouth 2 (two) times daily with a meal. 60 capsule 6  . midodrine (PROAMATINE) 10 MG tablet TAKE 1 TABLET BY MOUTH 3 TIMES A DAY WITH MEALS 270 tablet 3  .  OLANZapine (ZYPREXA) 10 MG tablet Take 1 tablet (10 mg total) by mouth at bedtime. 90 tablet 0  . OXcarbazepine (TRILEPTAL) 150 MG tablet Take 1 tablet (150 mg total) by mouth 2 (two) times daily. 60 tablet 1  . PARoxetine (PAXIL) 10 MG tablet Take 1 tablet (10 mg total) by mouth daily. 90 tablet 0  . pravastatin (PRAVACHOL) 40 MG tablet TAKE 1 TABLET BY MOUTH EVERY DAY 30 tablet 12  . risperiDONE (RISPERDAL) 2 MG tablet  Take 1 tablet (2 mg total) by mouth at bedtime. 90 tablet 0  . traMADol (ULTRAM) 50 MG tablet tramadol 50 mg tablet  Take 1 tablet every 6 hours by oral route as needed.     No current facility-administered medications for this visit.      Musculoskeletal: Strength & Muscle Tone: UTA Gait & Station: Reports as WNL Patient leans: N/A  Psychiatric Specialty Exam: Review of Systems  Psychiatric/Behavioral: Negative for depression, hallucinations, substance abuse and suicidal ideas. The patient is not nervous/anxious.   All other systems reviewed and are negative.   There were no vitals taken for this visit.There is no height or weight on file to calculate BMI.  General Appearance: UTA  Eye Contact:  UTA  Speech:  Clear and Coherent  Volume:  Normal  Mood:  Euthymic  Affect:  UTA  Thought Process:  Goal Directed and Descriptions of Associations: Intact  Orientation:  Other:  Person, situation  Thought Content: Logical   Suicidal Thoughts:  No  Homicidal Thoughts:  No  Memory:  Immediate;   Fair Recent;   baseline Remote;   baseline  Judgement:  Fair  Insight:  Shallow  Psychomotor Activity:  UTA  Concentration:  Concentration: Fair and Attention Span: Fair  Recall:  [employer] of Knowledge: Limited  Language: Fair  Akathisia:  No  Handed:  Right  AIMS (if indicated):UTA  Assets:  Armed forces logistics/support/administrative officer Social Support  ADL's:  Intact  Cognition: WNL  Sleep:  Fair   Screenings: AIMS     Office Visit from 02/23/2018 in [place] Total Score  3    PHQ2-9     Office Visit from 02/16/2017 in [place]  PHQ-2 Total Score  0  PHQ-9 Total Score  0       Assessment and Plan: Yasiel is a 68 year old Caucasian male, divorced, lives in [place], [place], has a history of paranoid schizophrenia, TD was evaluated by telemedicine today.  Patient per his sister has been having some irritability on and off.  We will make the following medication changes.  Plan For schizophrenia--unstable Continue reduced dosage of Zyprexa 10 mg p.o. nightly Risperidone 2 mg p.o. nightly Paxil as prescribed for now Add Trileptal 150 mg p.o. twice daily Patient is on 2 antipsychotic medications.  The long-term goal is to taper off to monotherapy.  For prolonged QTC on EKG-per history-currently resolved.  Will monitor closely.  Fatigue-chronic-patient declined medications  Follow-up in clinic in 2 weeks or sooner if needed.  October 20 at 11:40 AM  I have spent atleast 15 minutes non face to face with patient today. More than 50 % of the time was spent for psychoeducation and supportive psychotherapy and care coordination. This note was generated in part or whole with voice recognition software. Voice recognition is usually quite accurate but there are transcription errors that can and very often do occur. I apologize for any typographical errors that were not detected and corrected.       Ursula Alert, MD 09/18/2019, 11:36 AM

## 2019-10-02 ENCOUNTER — Other Ambulatory Visit: Payer: Self-pay

## 2019-10-02 ENCOUNTER — Encounter: Payer: Self-pay | Admitting: Psychiatry

## 2019-10-02 ENCOUNTER — Ambulatory Visit (INDEPENDENT_AMBULATORY_CARE_PROVIDER_SITE_OTHER): Payer: Medicare Other | Admitting: Psychiatry

## 2019-10-02 ENCOUNTER — Ambulatory Visit: Payer: Medicare Other | Admitting: Psychiatry

## 2019-10-02 DIAGNOSIS — G2401 Drug induced subacute dyskinesia: Secondary | ICD-10-CM

## 2019-10-02 DIAGNOSIS — Z87898 Personal history of other specified conditions: Secondary | ICD-10-CM | POA: Diagnosis not present

## 2019-10-02 DIAGNOSIS — F200 Paranoid schizophrenia: Secondary | ICD-10-CM

## 2019-10-02 MED ORDER — PAROXETINE HCL 10 MG PO TABS: 10.0000 mg | ORAL_TABLET | Freq: Every day | ORAL | 0 refills | Status: DC

## 2019-10-02 MED ORDER — RISPERIDONE 2 MG PO TABS: 2.0000 mg | ORAL_TABLET | Freq: Every day | ORAL | 0 refills | Status: DC

## 2019-10-02 MED ORDER — OLANZAPINE 10 MG PO TABS: 10.0000 mg | ORAL_TABLET | Freq: Every day | ORAL | 0 refills | Status: DC

## 2019-10-02 NOTE — Progress Notes (Signed)
Virtual Visit via Telephone Note  I connected with [name] on 10/02/19 at 11:40 AM EDT by telephone and verified that I am speaking with the correct person using two identifiers.   I discussed the limitations, risks, security and privacy concerns of performing an evaluation and management service by telephone and the availability of in person appointments. I also discussed with the patient that there may be a patient responsible charge related to this service. The patient expressed understanding and agreed to proceed.   I discussed the assessment and treatment plan with the patient. The patient was provided an opportunity to ask questions and all were answered. The patient agreed with the plan and demonstrated an understanding of the instructions.   The patient was advised to call back or seek an in-person evaluation if the symptoms worsen or if the condition fails to improve as anticipated.   [place] MD OP Progress Note  10/02/2019 5:58 PM ANDON JEFFRES  MRN:  [id number]  Chief Complaint:  Chief Complaint    Follow-up     HPI: [name] is a 68 year old Caucasian male, divorced, lives in [place], has a history of schizophrenia, tardive dyskinesia was evaluated by telemedicine today.  Patient preferred to do a phone call.  Collateral information was obtained from patient's sister- Lelan Pons -who reports patient is doing much better on the current medications.  He is tolerating Trileptal well.  Patient appeared to be calm and cooperative.  He was able to answer questions but in short phrases.  He reports mood as okay.  He denies being depressed or anxious.  He denies any perceptual disturbances.  He denies any suicidality or homicidality.  He appeared to be alert, oriented to person place and situation.  Patient as well as sister denies any other concerns today. Visit Diagnosis:    ICD-10-CM   1. Paranoid schizophrenia ([place])  F20.0 OLANZapine (ZYPREXA) 10 MG tablet    PARoxetine  (PAXIL) 10 MG tablet    risperiDONE (RISPERDAL) 2 MG tablet  2. Tardive dyskinesia  G24.01   3. History of prolonged Q-T interval on ECG  Z87.898     Past Psychiatric History: I have reviewed past psychiatric history from my progress note on 02/23/2018.  Past trials of Haldol, Klonopin, Seroquel  Past Medical History:  Past Medical History:  Diagnosis Date  . Chicken pox   . Dizziness   . Measles   . Mumps   . Schizophrenia [facility])     Past Surgical History:  Procedure Laterality Date  . ABDOMINAL SURGERY    . COLON SURGERY    . LAPAROTOMY N/A 03/20/2016   Procedure: EXPLORATORY LAPAROTOMY with lysis of adhesions;  Surgeon: Jules Husbands, MD;  Location: [facility] ORS;  Service: General;  Laterality: N/A;  . None      Family Psychiatric History: I have reviewed family psychiatric history from my progress note on 02/23/2018.  Family History:  Family History  Problem Relation Age of Onset  . Diabetes Mother   . Heart disease Mother   . Stroke Mother   . Diabetes Father   . Hypertension Father   . Lung cancer Father   . Cancer Father        lung cancer  . Schizophrenia Sister   . Lung cancer Sister   . Colon cancer Sister     Social History: I have reviewed social history from my progress note on 02/23/2018. Social History   Socioeconomic History  . Marital status: Divorced  Spouse name: Not on file  . Number of children: 2  . Years of education: Not on file  . Highest education level: High school graduate  Occupational History  . Occupation: Disabled  Social Needs  . Financial resource strain: Not hard at all  . Food insecurity    Worry: Never true    Inability: Never true  . Transportation needs    Medical: No    Non-medical: No  Tobacco Use  . Smoking status: Former Smoker    Types: Cigarettes    Quit date: 08/04/2004    Years since quitting: 15.1  . Smokeless tobacco: Former Systems developer    Quit date: 08/04/2004  Substance and Sexual Activity  . Alcohol use: No     Alcohol/week: 0.0 standard drinks  . Drug use: No  . Sexual activity: Not Currently  Lifestyle  . Physical activity    Days per week: 0 days    Minutes per session: 0 min  . Stress: Not on file  Relationships  . Social connections    Talks on phone: More than three times a week    Gets together: More than three times a week    Attends religious service: More than 4 times per year    Active member of club or organization: No    Attends meetings of clubs or organizations: Never    Relationship status: Divorced  Other Topics Concern  . Not on file  Social History Narrative  . Not on file    Allergies:  Allergies  Allergen Reactions  . Vitamin B12 Other (See Comments)    interacted with other medications     Metabolic Disorder Labs: No results found for: HGBA1C, MPG No results found for: PROLACTIN Lab Results  Component Value Date   CHOL 155 06/14/2019   TRIG 123 06/14/2019   HDL 39 (L) 06/14/2019   CHOLHDL 4.0 06/14/2019   LDLCALC 91 06/14/2019   LDLCALC 142 (H) 02/16/2017   Lab Results  Component Value Date   TSH 2.420 06/14/2019   TSH 1.333 05/04/2016    Therapeutic Level Labs: No results found for: LITHIUM No results found for: VALPROATE No components found for:  CBMZ  Current Medications: Current Outpatient Medications  Medication Sig Dispense Refill  . AMITIZA 24 MCG capsule     . cephALEXin (KEFLEX) 500 MG capsule     . fludrocortisone (FLORINEF) 0.1 MG tablet TAKE 1 TABLET (100MCG TOTAL) BY MOUTH (TWO) TIMES DAILY (Patient taking differently: Take 0.1 mg by mouth 2 (two) times daily. ) 180 tablet 3  . ketoconazole (NIZORAL) 200 MG tablet     . lubiprostone (AMITIZA) 24 MCG capsule Take 1 capsule (24 mcg total) by mouth 2 (two) times daily with a meal. 60 capsule 6  . midodrine (PROAMATINE) 10 MG tablet TAKE 1 TABLET BY MOUTH 3 TIMES A DAY WITH MEALS 270 tablet 3  . OLANZapine (ZYPREXA) 10 MG tablet Take 1 tablet (10 mg total) by mouth at bedtime. 90  tablet 0  . OXcarbazepine (TRILEPTAL) 150 MG tablet Take 1 tablet (150 mg total) by mouth 2 (two) times daily. 60 tablet 1  . PARoxetine (PAXIL) 10 MG tablet Take 1 tablet (10 mg total) by mouth daily. 90 tablet 0  . pravastatin (PRAVACHOL) 40 MG tablet TAKE 1 TABLET BY MOUTH EVERY DAY 30 tablet 12  . risperiDONE (RISPERDAL) 2 MG tablet Take 1 tablet (2 mg total) by mouth at bedtime. 90 tablet 0  . traMADol (ULTRAM) 50 MG  tablet tramadol 50 mg tablet  Take 1 tablet every 6 hours by oral route as needed.     No current facility-administered medications for this visit.      Musculoskeletal: Strength & Muscle Tone: UTA Gait & Station: Reports as WNL Patient leans: N/A  Psychiatric Specialty Exam: Review of Systems  Psychiatric/Behavioral: Negative for depression, hallucinations, substance abuse and suicidal ideas. The patient is not nervous/anxious and does not have insomnia.   All other systems reviewed and are negative.   There were no vitals taken for this visit.There is no height or weight on file to calculate BMI.  General Appearance: UTA  Eye Contact:  UTA  Speech:  Clear and Coherent  Volume:  Normal  Mood:  Euthymic  Affect:  UTA  Thought Process:  Goal Directed and Descriptions of Associations: Intact  Orientation:  Full (Time, Place, and Person)  Thought Content: Logical   Suicidal Thoughts:  No  Homicidal Thoughts:  No  Memory:  Immediate;   Fair Recent;   Fair Remote;   Fair  Judgement:  Fair  Insight:  Fair  Psychomotor Activity:  UTA  Concentration:  Concentration: Fair and Attention Span: Fair  Recall:  [employer] of Knowledge: Fair  Language: Fair  Akathisia:  No  Handed:  Right  AIMS (if indicated): Denies tremors, rigidity  Assets:  Communication Skills Desire for Improvement Social Support  ADL's:  Intact  Cognition: WNL  Sleep:  Fair   Screenings: AIMS     Office Visit from 02/23/2018 in [place] Total  Score  3    PHQ2-9     Office Visit from 02/16/2017 in [place]  PHQ-2 Total Score  0  PHQ-9 Total Score  0       Assessment and Plan: Loral is a 68 year old Caucasian male, divorced, lives in [place], on [place], has a history of paranoid schizophrenia, TD was evaluated by telemedicine today.  Patient is currently making progress on the recent medication changes.  Plan For schizophrenia-improving Zyprexa at reduced dosage of 10 mg p.o. nightly Risperidone 2 mg p.o. nightly Paxil as prescribed. Trileptal 150 mg p.o. twice daily. Patient is on 2 antipsychotic medication and the long-term goal is to taper off to monotherapy.  For history of prolonged QTC on EKG-currently resolved.  TD-unstable, chronic Patient declined medications.  Collateral information was obtained from sister-Marie as summarized above.  Follow-up in clinic in 2 to 3 months or sooner if needed.  December 30 at 11:30 AM  I have spent atleast 15 minutes non face to face with patient today. More than 50 % of the time was spent for psychoeducation and supportive psychotherapy and care coordination. This note was generated in part or whole with voice recognition software. Voice recognition is usually quite accurate but there are transcription errors that can and very often do occur. I apologize for any typographical errors that were not detected and corrected.       Ursula Alert, MD 10/02/2019, 5:58 PM

## 2019-10-12 ENCOUNTER — Other Ambulatory Visit: Payer: Self-pay | Admitting: Family Medicine

## 2019-10-12 DIAGNOSIS — K5900 Constipation, unspecified: Secondary | ICD-10-CM

## 2019-11-20 ENCOUNTER — Other Ambulatory Visit: Payer: Self-pay | Admitting: Psychiatry

## 2019-11-20 DIAGNOSIS — F200 Paranoid schizophrenia: Secondary | ICD-10-CM

## 2019-12-12 ENCOUNTER — Ambulatory Visit (INDEPENDENT_AMBULATORY_CARE_PROVIDER_SITE_OTHER): Payer: Medicare Other | Admitting: Psychiatry

## 2019-12-12 ENCOUNTER — Other Ambulatory Visit: Payer: Self-pay

## 2019-12-12 ENCOUNTER — Encounter: Payer: Self-pay | Admitting: Psychiatry

## 2019-12-12 DIAGNOSIS — G2401 Drug induced subacute dyskinesia: Secondary | ICD-10-CM

## 2019-12-12 DIAGNOSIS — Z87898 Personal history of other specified conditions: Secondary | ICD-10-CM

## 2019-12-12 DIAGNOSIS — F209 Schizophrenia, unspecified: Secondary | ICD-10-CM | POA: Diagnosis not present

## 2019-12-12 MED ORDER — PAROXETINE HCL 10 MG PO TABS: 10.0000 mg | ORAL_TABLET | Freq: Every day | ORAL | 0 refills | Status: DC

## 2019-12-12 MED ORDER — OLANZAPINE 10 MG PO TABS: 10.0000 mg | ORAL_TABLET | Freq: Every day | ORAL | 0 refills | Status: DC

## 2019-12-12 MED ORDER — RISPERIDONE 2 MG PO TABS: 2.0000 mg | ORAL_TABLET | Freq: Every day | ORAL | 0 refills | Status: DC

## 2019-12-12 NOTE — Progress Notes (Signed)
Virtual Visit via Telephone Note  I connected with [name] on 12/12/19 at 11:30 AM EST by telephone and verified that I am speaking with the correct person using two identifiers.   I discussed the limitations, risks, security and privacy concerns of performing an evaluation and management service by telephone and the availability of in person appointments. I also discussed with the patient that there may be a patient responsible charge related to this service. The patient expressed understanding and agreed to proceed.      I discussed the assessment and treatment plan with the patient. The patient was provided an opportunity to ask questions and all were answered. The patient agreed with the plan and demonstrated an understanding of the instructions.   The patient was advised to call back or seek an in-person evaluation if the symptoms worsen or if the condition fails to improve as anticipated.   [place] MD OP Progress Note  12/12/2019 12:02 PM DALLYN NIEBLAS  MRN:  [id number]  Chief Complaint:  Chief Complaint    Follow-up     HPI: [name] is a 68 year old Caucasian male, divorced, lives in [place], has a history of schizophrenia, tardive dyskinesia was evaluated by phone today.  Patient preferred to do a phone call.  Patient's Sister Lelan Pons provided collateral information.  Per Retta Diones patient does have episodes when he is irritable with her.  So far she has been able to manage it and she hence wants to keep him on the current dosage of medications and does not want to make any more changes.  She reports she is worried about his weight gain.  He also has an increased appetite especially for sugar.  He craves sugar and eats candy all day.  She reports she does not believe it is due to his medications like olanzapine or risperidone however that he loves sugar and she is trying to get him to stop doing it.  She also wants him to exercise more which he does not do.  She reports she  has scheduled an appointment with his primary care and is planning to talk to them about the same.  Patient today appears to be cooperative, and pleasant.  He was able to answer all questions appropriately although in short phrases.  He appeared to be alert and oriented.  He was able to tell me his date of birth and his address.  He was also able to tell the date and time.  Patient reports he is doing okay.  He denies any perceptual disturbances.  Patient denies any suicidal thoughts or homicidal ideation.  Patient reports he is compliant on his medications.  Patient denies any other concerns today.     Visit Diagnosis:    ICD-10-CM   1. Schizophrenia in full remission with history of multiple episodes ([facility])  F20.9 OLANZapine (ZYPREXA) 10 MG tablet    PARoxetine (PAXIL) 10 MG tablet    risperiDONE (RISPERDAL) 2 MG tablet  2. Tardive dyskinesia  G24.01   3. History of prolonged Q-T interval on ECG  Z87.898     Past Psychiatric History: I have reviewed past psychiatric history from my progress note on 02/23/2018.  Past trials of Haldol, Klonopin, Seroquel  Past Medical History:  Past Medical History:  Diagnosis Date  . Chicken pox   . Dizziness   . Measles   . Mumps   . Schizophrenia [facility])     Past Surgical History:  Procedure Laterality Date  . ABDOMINAL SURGERY    .  COLON SURGERY    . LAPAROTOMY N/A 03/20/2016   Procedure: EXPLORATORY LAPAROTOMY with lysis of adhesions;  Surgeon: Jules Husbands, MD;  Location: [facility] ORS;  Service: General;  Laterality: N/A;  . None      Family Psychiatric History: I have reviewed family psychiatric history from my progress note on 02/23/2018.  Family History:  Family History  Problem Relation Age of Onset  . Diabetes Mother   . Heart disease Mother   . Stroke Mother   . Diabetes Father   . Hypertension Father   . Lung cancer Father   . Cancer Father        lung cancer  . Schizophrenia Sister   . Lung cancer Sister   . Colon cancer  Sister     Social History: I have reviewed social history from my progress note on 02/23/2018. Social History   Socioeconomic History  . Marital status: Divorced    Spouse name: Not on file  . Number of children: 2  . Years of education: Not on file  . Highest education level: High school graduate  Occupational History  . Occupation: Disabled  Tobacco Use  . Smoking status: Former Smoker    Types: Cigarettes    Quit date: 08/04/2004    Years since quitting: 15.3  . Smokeless tobacco: Former Systems developer    Quit date: 08/04/2004  Substance and Sexual Activity  . Alcohol use: No    Alcohol/week: 0.0 standard drinks  . Drug use: No  . Sexual activity: Not Currently  Other Topics Concern  . Not on file  Social History Narrative  . Not on file   Social Determinants of Health   Financial Resource Strain:   . Difficulty of Paying Living Expenses: Not on file  Food Insecurity:   . Worried About Charity fundraiser in the Last Year: Not on file  . Ran Out of Food in the Last Year: Not on file  Transportation Needs:   . Lack of Transportation (Medical): Not on file  . Lack of Transportation (Non-Medical): Not on file  Physical Activity:   . Days of Exercise per Week: Not on file  . Minutes of Exercise per Session: Not on file  Stress:   . Feeling of Stress : Not on file  Social Connections:   . Frequency of Communication with Friends and Family: Not on file  . Frequency of Social Gatherings with Friends and Family: Not on file  . Attends Religious Services: Not on file  . Active Member of Clubs or Organizations: Not on file  . Attends Archivist Meetings: Not on file  . Marital Status: Not on file    Allergies:  Allergies  Allergen Reactions  . Vitamin B12 Other (See Comments)    interacted with other medications     Metabolic Disorder Labs: No results found for: HGBA1C, MPG No results found for: PROLACTIN Lab Results  Component Value Date   CHOL 155 06/14/2019    TRIG 123 06/14/2019   HDL 39 (L) 06/14/2019   CHOLHDL 4.0 06/14/2019   LDLCALC 91 06/14/2019   LDLCALC 142 (H) 02/16/2017   Lab Results  Component Value Date   TSH 2.420 06/14/2019   TSH 1.333 05/04/2016    Therapeutic Level Labs: No results found for: LITHIUM No results found for: VALPROATE No components found for:  CBMZ  Current Medications: Current Outpatient Medications  Medication Sig Dispense Refill  . AMITIZA 24 MCG capsule     .  AMITIZA 24 MCG capsule TAKE ONE CAPSULE BY MOUTH TWICE A DAY WITH A MEAL. 60 capsule 12  . cephALEXin (KEFLEX) 500 MG capsule     . fludrocortisone (FLORINEF) 0.1 MG tablet TAKE 1 TABLET (100MCG TOTAL) BY MOUTH (TWO) TIMES DAILY (Patient taking differently: Take 0.1 mg by mouth 2 (two) times daily. ) 180 tablet 3  . ketoconazole (NIZORAL) 200 MG tablet     . midodrine (PROAMATINE) 10 MG tablet TAKE 1 TABLET BY MOUTH 3 TIMES A DAY WITH MEALS 270 tablet 3  . OLANZapine (ZYPREXA) 10 MG tablet Take 1 tablet (10 mg total) by mouth at bedtime. 90 tablet 0  . OXcarbazepine (TRILEPTAL) 150 MG tablet TAKE 1 TABLET BY MOUTH TWICE A DAY 60 tablet 1  . PARoxetine (PAXIL) 10 MG tablet Take 1 tablet (10 mg total) by mouth daily. 90 tablet 0  . pravastatin (PRAVACHOL) 40 MG tablet TAKE 1 TABLET BY MOUTH EVERY DAY 30 tablet 12  . risperiDONE (RISPERDAL) 2 MG tablet Take 1 tablet (2 mg total) by mouth at bedtime. 90 tablet 0  . traMADol (ULTRAM) 50 MG tablet tramadol 50 mg tablet  Take 1 tablet every 6 hours by oral route as needed.     No current facility-administered medications for this visit.     Musculoskeletal: Strength & Muscle Tone: UTA Gait & Station: Reports as WNL Patient leans: N/A  Psychiatric Specialty Exam: Review of Systems  Psychiatric/Behavioral: Negative for agitation, behavioral problems, confusion, decreased concentration, dysphoric mood, hallucinations, self-injury, sleep disturbance and suicidal ideas. The patient is not  nervous/anxious and is not hyperactive.   All other systems reviewed and are negative.   There were no vitals taken for this visit.There is no height or weight on file to calculate BMI.  General Appearance: UTA  Eye Contact:  UTA  Speech:  Clear and Coherent  Volume:  Normal  Mood:  Euthymic  Affect:  UTA  Thought Process:  Goal Directed and Descriptions of Associations: Intact  Orientation:  Full (Time, Place, and Person)  Thought Content: Logical   Suicidal Thoughts:  No  Homicidal Thoughts:  No  Memory:  Immediate;   Fair Recent;   Fair Remote;   Baseline  Judgement:  Other:  UTA  Insight:  Shallow  Psychomotor Activity:  UTA  Concentration:  Concentration: Fair and Attention Span: Fair  Recall:  [employer] of Knowledge: Fair  Language: Fair  Akathisia:  No  Handed:  Right  AIMS (if indicated): UTA  Assets:  Housing Social Support  ADL's:  Intact  Cognition: WNL  Sleep:  Fair   Screenings: AIMS     Office Visit from 02/23/2018 in [place] Total Score  3    PHQ2-9     Office Visit from 02/16/2017 in [place]  PHQ-2 Total Score  0  PHQ-9 Total Score  0       Assessment and Plan: Cato is a 68 year old Caucasian male, divorced, lives in [place], [place], has a history of schizophrenia, TD was evaluated by phone today.  Patient is currently stable on current medication regimen.  Plan as noted below.  Plan Schizophrenia-stable Continue Zyprexa 10 mg p.o. nightly. Risperidone 2 mg p.o. nightly Trileptal 150 mg p.o. twice daily Patient is on 2 antipsychotic medications, long-term goal is to taper off to monotherapy.  However per collateral information obtained from sister-patient does have episodes when he is irritable especially when he cannot get his way.  She is also concerned about his craving sweets and gaining weight and not getting enough exercise.  She however does not want writer to make any more  changes with his medications especially his antipsychotics like risperidone and Zyprexa and she feels he will do better if he can just stay on the same dose for now.  She reports she wants to talk to his primary care provider about his weight gain as well as lack of exercise.  He has upcoming appointment.  TD - chronic Patient declined medications  History of QT prolongation on EKG-currently resolved.  Will monitor closely.  Follow-up in clinic in 2 to 3 months or sooner if needed.  April 9 at 11:30 AM  I have spent atleast 15 minutes non face to face with patient today. More than 50 % of the time was spent for psychoeducation and supportive psychotherapy and care coordination. This note was generated in part or whole with voice recognition software. Voice recognition is usually quite accurate but there are transcription errors that can and very often do occur. I apologize for any typographical errors that were not detected and corrected.      Ursula Alert, MD 12/12/2019, 12:02 PM

## 2020-01-07 ENCOUNTER — Telehealth: Payer: Self-pay | Admitting: Family Medicine

## 2020-01-07 ENCOUNTER — Other Ambulatory Visit: Payer: Self-pay | Admitting: Family Medicine

## 2020-01-07 ENCOUNTER — Other Ambulatory Visit: Payer: Self-pay | Admitting: Psychiatry

## 2020-01-07 DIAGNOSIS — F200 Paranoid schizophrenia: Secondary | ICD-10-CM

## 2020-01-07 NOTE — Telephone Encounter (Signed)
Requested medication (s) are due for refill today: no  Requested medication (s) are on the active medication list: yes  Last refill:  10/12/2019  Future visit scheduled: yes  Notes to clinic:  this refill cannot be delegated  Please advise   Requested Prescriptions  Pending Prescriptions Disp Refills   fludrocortisone (FLORINEF) 0.1 MG tablet [Pharmacy Med Name: FLUDROCORTISONE ACETATE 0.1 MG TAB] 180 tablet 3    Sig: TAKE 1 TABLET BY MOUTH TWICE A DAY      Not Delegated - Endocrinology: Oral Corticosteroids - fludrocortisone Failed - 01/07/2020 10:38 AM      Failed - This refill cannot be delegated      Failed - K in normal range and within 180 days    Potassium  Date Value Ref Range Status  06/14/2019 4.0 3.5 - 5.2 mmol/L Final          Failed - Na in normal range and within 180 days    Sodium  Date Value Ref Range Status  06/14/2019 146 (H) 134 - 144 mmol/L Final          Failed - Last BP in normal range    BP Readings from Last 1 Encounters:  06/05/19 (!) 144/86          Failed - Valid encounter within last 6 months    Recent Outpatient Visits           7 months ago High risk medication use   [facility] Birdie Sons, MD   1 year ago Cellulitis of finger of right hand   [facility] Birdie Sons, MD   2 years ago Rt flank pain   [facility] Birdie Sons, MD   2 years ago Bilateral impacted cerumen   [facility] Birdie Sons, MD   2 years ago Dizziness   [facility] Birdie Sons, MD

## 2020-01-07 NOTE — Telephone Encounter (Signed)
error 

## 2020-01-07 NOTE — Telephone Encounter (Signed)
L.O.V. was on 06/05/2019 and patient BP was 144/86. Patient has a upcoming appointment on 03/04/2020, please advise.

## 2020-02-08 ENCOUNTER — Telehealth: Payer: Self-pay

## 2020-02-08 ENCOUNTER — Other Ambulatory Visit: Payer: Self-pay | Admitting: Family Medicine

## 2020-02-08 DIAGNOSIS — K5900 Constipation, unspecified: Secondary | ICD-10-CM

## 2020-02-08 NOTE — Telephone Encounter (Signed)
Pt.'s wife called for refills of Amitiza. Pt.'s pharmacy has refills on record. Pharmacy states pt. Is not due a refill until March. Attempted to call pt. Back. Unable to leave a message.

## 2020-02-08 NOTE — Telephone Encounter (Signed)
Pts wife called stating that pt is needing to have his medication for his constipation refilled. Please advise.      MEDICAL [address] [place], [place] [address]  Phone: [phone] Fax: [phone]  Not a 24 hour pharmacy; exact hours not known.

## 2020-02-18 ENCOUNTER — Other Ambulatory Visit: Payer: Self-pay | Admitting: Family Medicine

## 2020-02-18 ENCOUNTER — Telehealth: Payer: Self-pay | Admitting: Family Medicine

## 2020-02-18 NOTE — Telephone Encounter (Signed)
Requested medication (s) are due for refill today: yes  Requested medication (s) are on the active medication list: no  Last refill: ?  Future visit scheduled:yes  Notes to clinic: not on pt's active med list   Requested Prescriptions  Pending Prescriptions Disp Refills   polyethylene glycol powder (GLYCOLAX/MIRALAX) 17 GM/SCOOP powder [Pharmacy Med Name: POLYETHYLENE GLYCOL 3350 POW GM] 850 g     Sig: MIX 17GM (AS MARKED IN BOTTLE TOP) IN 8 OUNCES OF WATER, MIX AND DRINK ONCE A DAY AS DIRECTED.      Gastroenterology:  Laxatives Passed - 02/18/2020  1:06 PM      Passed - Valid encounter within last 12 months    Recent Outpatient Visits           8 months ago High risk medication use   [facility] Birdie Sons, MD   1 year ago Cellulitis of finger of right hand   [facility] Birdie Sons, MD   2 years ago Rt flank pain   [facility] Birdie Sons, MD   2 years ago Bilateral impacted cerumen   [facility] Birdie Sons, MD   2 years ago Dizziness   [facility] Birdie Sons, MD

## 2020-02-18 NOTE — Telephone Encounter (Signed)
Medication: polyethylene glycol  Pt states that it is much cheaper with an [place], [place] Phone:  [phone]  Fax:  [phone]     Pt aware of turn around time

## 2020-02-27 ENCOUNTER — Telehealth: Payer: Self-pay

## 2020-02-27 NOTE — Telephone Encounter (Signed)
Spoke with pt and inquired about scheduling an AWV prior to CPE on 03/04/20. Pt declined and stated he only wanted to complete this physical with PCP. Juluis Rainier!

## 2020-03-04 ENCOUNTER — Encounter: Payer: Medicare Other | Admitting: Family Medicine

## 2020-03-04 NOTE — Progress Notes (Deleted)
Patient: [name], Male    DOB: 11-06-1951, 69 y.o.   MRN: [id number] Visit Date: 03/04/2020  Today's Provider: Lelon Huh, MD   No chief complaint on file.  Subjective:     Annual wellness visit [name] is a 69 y.o. male. He feels {DESC; WELL/FAIRLY WELL/POORLY:18703}. He reports exercising ***. He reports he is sleeping {DESC; WELL/FAIRLY WELL/POORLY:18703}.  -----------------------------------------------------------  Lipid/Cholesterol, Follow-up:   Last seen for this 9 months ago.  Management changes since that visit include none. . Last Lipid Panel:    Component Value Date/Time   CHOL 155 06/14/2019 0914   TRIG 123 06/14/2019 0914   HDL 39 (L) 06/14/2019 0914   CHOLHDL 4.0 06/14/2019 0914   LDLCALC 91 06/14/2019 0914    Risk factors for vascular disease include hypercholesterolemia  He reports {excellent/good/fair/poor:19665} compliance with treatment. He {ACTION; IS/IS GI:[id number] having side effects.  Current symptoms include {Symptoms; diabetes:14075} and have been {Desc; course:15616}. Weight trend: {trend:16658} Prior visit with dietician: no Current diet: {diet habits:16563} Current exercise: {exercise types:16438}  Wt Readings from Last 3 Encounters:  06/05/19 244 lb (110.7 kg)  01/25/19 240 lb 4.8 oz (109 kg)  06/18/18 233 lb (105.7 kg)    -------------------------------------------------------------------  Follow up for Idiopathic hypotension:  The patient was last seen for this 9 months ago. Changes made at last visit include none; fairly well controlled on current medications.  He reports {excellent/good/fair/poor:19665} compliance with treatment. He feels that condition is {improved/worse/unchanged:3041574}. He {ACTION; IS/IS GI:[id number] having side effects. ***  ------------------------------------------------------------------------------------  Follow up for CKD:  The patient was last seen for this 9  months ago. Changes made at last visit include none.  He reports {excellent/good/fair/poor:19665} compliance with treatment. He feels that condition is {improved/worse/unchanged:3041574}. He {ACTION; IS/IS GI:[id number] having side effects. ***  ------------------------------------------------------------------------------------   Review of Systems  Constitutional: Negative for appetite change, chills, fatigue and fever.  HENT: Negative for congestion, ear pain, hearing loss, nosebleeds and trouble swallowing.   Eyes: Negative for pain and visual disturbance.  Respiratory: Negative for cough, chest tightness and shortness of breath.   Cardiovascular: Negative for chest pain, palpitations and leg swelling.  Gastrointestinal: Negative for abdominal pain, blood in stool, constipation, diarrhea, nausea and vomiting.  Endocrine: Negative for polydipsia, polyphagia and polyuria.  Genitourinary: Negative for dysuria and flank pain.  Musculoskeletal: Negative for arthralgias, back pain, joint swelling, myalgias and neck stiffness.  Skin: Negative for color change, rash and wound.  Neurological: Negative for dizziness, tremors, seizures, speech difficulty, weakness, light-headedness and headaches.  Psychiatric/Behavioral: Negative for behavioral problems, confusion, decreased concentration, dysphoric mood and sleep disturbance. The patient is not nervous/anxious.   All other systems reviewed and are negative.   Social History   Socioeconomic History  . Marital status: Divorced    Spouse name: Not on file  . Number of children: 2  . Years of education: Not on file  . Highest education level: High school graduate  Occupational History  . Occupation: Disabled  Tobacco Use  . Smoking status: Former Smoker    Types: Cigarettes    Quit date: 08/04/2004    Years since quitting: 15.5  . Smokeless tobacco: Former Systems developer    Quit date: 08/04/2004  Substance and Sexual Activity  . Alcohol use: No      Alcohol/week: 0.0 standard drinks  . Drug use: No  . Sexual activity: Not Currently  Other Topics Concern  . Not on file  Social History Narrative  .  Not on file   Social Determinants of Health   Financial Resource Strain:   . Difficulty of Paying Living Expenses:   Food Insecurity:   . Worried About Charity fundraiser in the Last Year:   . Arboriculturist in the Last Year:   Transportation Needs:   . Film/video editor (Medical):   Marland Kitchen Lack of Transportation (Non-Medical):   Physical Activity:   . Days of Exercise per Week:   . Minutes of Exercise per Session:   Stress:   . Feeling of Stress :   Social Connections:   . Frequency of Communication with Friends and Family:   . Frequency of Social Gatherings with Friends and Family:   . Attends Religious Services:   . Active Member of Clubs or Organizations:   . Attends Archivist Meetings:   Marland Kitchen Marital Status:   Intimate Partner Violence:   . Fear of Current or Ex-Partner:   . Emotionally Abused:   Marland Kitchen Physically Abused:   . Sexually Abused:     Past Medical History:  Diagnosis Date  . Chicken pox   . Dizziness   . Measles   . Mumps   . Schizophrenia [facility])      Patient Active Problem List   Diagnosis Date Noted  . History of prolonged Q-T interval on ECG 09/18/2019  . Prolonged Q-T interval on ECG 06/07/2019  . Paranoid schizophrenia ([place]) 06/01/2019  . Tardive dyskinesia 06/01/2019  . Chronic idiopathic constipation 02/07/2019  . Cholecystitis 05/18/2016  . Chronic nausea 05/17/2016  . Hypoalbuminemia 05/14/2016  . Anemia 05/11/2016  . Hypotension 05/04/2016  . SBO (small bowel obstruction) ([place]) 03/18/2016  . Dizziness 05/16/2015  . Edema 05/15/2015  . Venous stasis dermatitis 05/15/2015  . History of adenomatous polyp of colon 12/01/2009  . Schizophrenia, simple, chronic ([place]) 08/21/2009  . CKD (chronic kidney disease) stage 3, GFR 30-59 ml/min 08/20/2009  . Hyperlipemia 08/19/2009     Past Surgical History:  Procedure Laterality Date  . ABDOMINAL SURGERY    . COLON SURGERY    . LAPAROTOMY N/A 03/20/2016   Procedure: EXPLORATORY LAPAROTOMY with lysis of adhesions;  Surgeon: Jules Husbands, MD;  Location: [facility] ORS;  Service: General;  Laterality: N/A;  . None      His family history includes Cancer in his father; Colon cancer in his sister; Diabetes in his father and mother; Heart disease in his mother; Hypertension in his father; Lung cancer in his father and sister; Schizophrenia in his sister; Stroke in his mother.   Current Outpatient Medications:  .  AMITIZA 24 MCG capsule, , Disp: , Rfl:  .  AMITIZA 24 MCG capsule, TAKE ONE CAPSULE BY MOUTH TWICE A DAY WITH A MEAL., Disp: 60 capsule, Rfl: 12 .  cephALEXin (KEFLEX) 500 MG capsule, , Disp: , Rfl:  .  fludrocortisone (FLORINEF) 0.1 MG tablet, Take 1 tablet (0.1 mg total) by mouth 2 (two) times daily. TAKE 1 TABLET BY MOUTH TWICE A DAY, Disp: 180 tablet, Rfl: 3 .  ketoconazole (NIZORAL) 200 MG tablet, , Disp: , Rfl:  .  midodrine (PROAMATINE) 10 MG tablet, TAKE 1 TABLET BY MOUTH 3 TIMES A DAY WITH MEALS, Disp: 270 tablet, Rfl: 3 .  OLANZapine (ZYPREXA) 10 MG tablet, Take 1 tablet (10 mg total) by mouth at bedtime., Disp: 90 tablet, Rfl: 0 .  OXcarbazepine (TRILEPTAL) 150 MG tablet, TAKE 1 TABLET BY MOUTH TWICE A DAY, Disp: 60 tablet, Rfl: 1 .  PARoxetine (PAXIL) 10 MG tablet, Take 1 tablet (10 mg total) by mouth daily., Disp: 90 tablet, Rfl: 0 .  polyethylene glycol powder (GLYCOLAX/MIRALAX) 17 GM/SCOOP powder, MIX 17GM (AS MARKED IN BOTTLE TOP) IN 8 OUNCES OF WATER, MIX AND DRINK ONCE A DAY AS DIRECTED., Disp: 850 g, Rfl: 5 .  pravastatin (PRAVACHOL) 40 MG tablet, TAKE 1 TABLET BY MOUTH EVERY DAY, Disp: 30 tablet, Rfl: 12 .  risperiDONE (RISPERDAL) 2 MG tablet, Take 1 tablet (2 mg total) by mouth at bedtime., Disp: 90 tablet, Rfl: 0 .  traMADol (ULTRAM) 50 MG tablet, tramadol 50 mg tablet  Take 1 tablet every 6 hours  by oral route as needed., Disp: , Rfl:   Patient Care Team: Birdie Sons, MD as PCP - General (Family Medicine) Marjie Skiff, MD as Consulting Physician (Psychiatry)    Objective:    Vitals: There were no vitals taken for this visit.  Physical Exam  Activities of Daily Living No flowsheet data found.  Fall Risk Assessment Fall Risk  02/16/2017 09/20/2016  Falls in the past year? No Yes  Number falls in past yr: - 1  Injury with Fall? - No  Follow up - Falls prevention discussed     Depression Screen PHQ 2/9 Scores 02/16/2017 02/16/2017  PHQ - 2 Score 0 0  PHQ- 9 Score 0 -    No flowsheet data found.    Assessment & Plan:     Annual Wellness Visit  Reviewed patient's Family Medical History Reviewed and updated list of patient's medical providers Assessment of cognitive impairment was done Assessed patient's functional ability Established a written schedule for health screening [place] Completed and Reviewed  Exercise Activities and Dietary recommendations Goals   None     Immunization History  Administered Date(s) Administered  . Influenza, High Dose Seasonal PF 09/20/2016  . Pneumococcal Conjugate-13 05/16/2015  . Pneumococcal Polysaccharide-23 09/20/2016    Health Maintenance  Topic Date Due  . TETANUS/TDAP  Never done  . COLONOSCOPY  12/01/2014  . INFLUENZA VACCINE  07/14/2019  . Hepatitis C Screening  Completed  . PNA vac Low Risk Adult  Completed     Discussed health benefits of physical activity, and encouraged him to engage in regular exercise appropriate for his age and condition.    ------------------------------------------------------------------------------------------------------------    Lelon Huh, MD  [place]

## 2020-03-21 ENCOUNTER — Other Ambulatory Visit: Payer: Self-pay

## 2020-03-21 ENCOUNTER — Encounter: Payer: Self-pay | Admitting: Psychiatry

## 2020-03-21 ENCOUNTER — Other Ambulatory Visit: Payer: Self-pay | Admitting: Psychiatry

## 2020-03-21 ENCOUNTER — Ambulatory Visit (INDEPENDENT_AMBULATORY_CARE_PROVIDER_SITE_OTHER): Payer: Medicare Other | Admitting: Psychiatry

## 2020-03-21 DIAGNOSIS — G2401 Drug induced subacute dyskinesia: Secondary | ICD-10-CM | POA: Diagnosis not present

## 2020-03-21 DIAGNOSIS — F209 Schizophrenia, unspecified: Secondary | ICD-10-CM | POA: Insufficient documentation

## 2020-03-21 MED ORDER — PAROXETINE HCL 10 MG PO TABS: 10.0000 mg | ORAL_TABLET | Freq: Every day | ORAL | 0 refills | Status: DC

## 2020-03-21 MED ORDER — OLANZAPINE 10 MG PO TABS: 10.0000 mg | ORAL_TABLET | Freq: Every day | ORAL | 0 refills | Status: DC

## 2020-03-21 MED ORDER — RISPERIDONE 2 MG PO TABS: 2.0000 mg | ORAL_TABLET | Freq: Every day | ORAL | 0 refills | Status: DC

## 2020-03-21 MED ORDER — OXCARBAZEPINE 150 MG PO TABS: 150.0000 mg | ORAL_TABLET | Freq: Two times a day (BID) | ORAL | 0 refills | Status: DC

## 2020-03-21 NOTE — Progress Notes (Signed)
Provider Location : [facility] Patient Location : Home  Virtual Visit via Telephone Note  I connected with [name] on 03/21/20 at 11:30 AM EDT by telephone and verified that I am speaking with the correct person using two identifiers.   I discussed the limitations, risks, security and privacy concerns of performing an evaluation and management service by telephone and the availability of in person appointments. I also discussed with the patient that there may be a patient responsible charge related to this service. The patient expressed understanding and agreed to proceed.    I discussed the assessment and treatment plan with the patient. The patient was provided an opportunity to ask questions and all were answered. The patient agreed with the plan and demonstrated an understanding of the instructions.   The patient was advised to call back or seek an in-person evaluation if the symptoms worsen or if the condition fails to improve as anticipated.   Kreamer MD OP Progress Note  03/21/2020 12:21 PM [name]  MRN:  [id number]  Chief Complaint:  Chief Complaint    Follow-up     HPI: [name] is a 69 year old Caucasian male, divorced, lives in [place], has a history of schizophrenia, tardive dyskinesia was evaluated by phone today.  Patient preferred to do a phone call.  Patient's Sister [name] provided collateral information.  Per sister patient is currently stable on current medication regimen.  She however is concerned about his weight gain.  His appetite seems to be increased.  She does not believe it is secondary to the medications rather believes he has been this way all his life.  She does not want him to make any more changes with his medications at this time.  Patient today appeared to be cooperative and pleasant.  He answered all questions appropriately.  He appeared to be alert and oriented to person  time and situation.  Patient reports he is compliant on his medications and  denies concerns.  Patient denies any suicidality, homicidality or perceptual disturbances.  Patient denies any other concerns today. Visit Diagnosis:    ICD-10-CM   1. Schizophrenia in full remission with history of multiple episodes (HCC)  F20.9 risperiDONE (RISPERDAL) 2 MG tablet    OLANZapine (ZYPREXA) 10 MG tablet    PARoxetine (PAXIL) 10 MG tablet  2. Tardive dyskinesia  G24.01 OXcarbazepine (TRILEPTAL) 150 MG tablet    Past Psychiatric History: I have reviewed past psychiatric history from my progress note on 02/23/2018.  Past trials of Haldol, Klonopin, Seroquel  Past Medical History:  Past Medical History:  Diagnosis Date  . Chicken pox   . Dizziness   . Measles   . Mumps   . Schizophrenia [facility])     Past Surgical History:  Procedure Laterality Date  . ABDOMINAL SURGERY    . COLON SURGERY    . LAPAROTOMY N/A 03/20/2016   Procedure: EXPLORATORY LAPAROTOMY with lysis of adhesions;  Surgeon: Jules Husbands, MD;  Location: [facility] ORS;  Service: General;  Laterality: N/A;  . None      Family Psychiatric History: I have reviewed family psychiatric history from my progress note on 02/23/2018.  Family History:  Family History  Problem Relation Age of Onset  . Diabetes Mother   . Heart disease Mother   . Stroke Mother   . Diabetes Father   . Hypertension Father   . Lung cancer Father   . Cancer Father        lung cancer  . Schizophrenia  Sister   . Lung cancer Sister   . Colon cancer Sister     Social History: Reviewed social history from my progress note on 02/23/2018. Social History   Socioeconomic History  . Marital status: Divorced    Spouse name: Not on file  . Number of children: 2  . Years of education: Not on file  . Highest education level: High school graduate  Occupational History  . Occupation: Disabled  Tobacco Use  . Smoking status: Former Smoker    Types: Cigarettes    Quit date: 08/04/2004    Years since quitting: 15.6  . Smokeless tobacco:  Former Systems developer    Quit date: 08/04/2004  Substance and Sexual Activity  . Alcohol use: No    Alcohol/week: 0.0 standard drinks  . Drug use: No  . Sexual activity: Not Currently  Other Topics Concern  . Not on file  Social History Narrative  . Not on file   Social Determinants of Health   Financial Resource Strain:   . Difficulty of Paying Living Expenses:   Food Insecurity:   . Worried About Charity fundraiser in the Last Year:   . Arboriculturist in the Last Year:   Transportation Needs:   . Film/video editor (Medical):   Marland Kitchen Lack of Transportation (Non-Medical):   Physical Activity:   . Days of Exercise per Week:   . Minutes of Exercise per Session:   Stress:   . Feeling of Stress :   Social Connections:   . Frequency of Communication with Friends and Family:   . Frequency of Social Gatherings with Friends and Family:   . Attends Religious Services:   . Active Member of Clubs or Organizations:   . Attends Archivist Meetings:   Marland Kitchen Marital Status:     Allergies:  Allergies  Allergen Reactions  . Vitamin B12 Other (See Comments)    interacted with other medications     Metabolic Disorder Labs: No results found for: HGBA1C, MPG No results found for: PROLACTIN Lab Results  Component Value Date   CHOL 155 06/14/2019   TRIG 123 06/14/2019   HDL 39 (L) 06/14/2019   CHOLHDL 4.0 06/14/2019   LDLCALC 91 06/14/2019   LDLCALC 142 (H) 02/16/2017   Lab Results  Component Value Date   TSH 2.420 06/14/2019   TSH 1.333 05/04/2016    Therapeutic Level Labs: No results found for: LITHIUM No results found for: VALPROATE No components found for:  CBMZ  Current Medications: Current Outpatient Medications  Medication Sig Dispense Refill  . AMITIZA 24 MCG capsule     . AMITIZA 24 MCG capsule TAKE ONE CAPSULE BY MOUTH TWICE A DAY WITH A MEAL. 60 capsule 12  . cephALEXin (KEFLEX) 500 MG capsule     . fludrocortisone (FLORINEF) 0.1 MG tablet Take 1 tablet (0.1  mg total) by mouth 2 (two) times daily. TAKE 1 TABLET BY MOUTH TWICE A DAY 180 tablet 3  . ketoconazole (NIZORAL) 200 MG tablet     . midodrine (PROAMATINE) 10 MG tablet TAKE 1 TABLET BY MOUTH 3 TIMES A DAY WITH MEALS 270 tablet 3  . OLANZapine (ZYPREXA) 10 MG tablet Take 1 tablet (10 mg total) by mouth at bedtime. 90 tablet 0  . OXcarbazepine (TRILEPTAL) 150 MG tablet Take 1 tablet (150 mg total) by mouth 2 (two) times daily. 180 tablet 0  . PARoxetine (PAXIL) 10 MG tablet Take 1 tablet (10 mg total) by mouth daily.  90 tablet 0  . polyethylene glycol powder (GLYCOLAX/MIRALAX) 17 GM/SCOOP powder MIX 17GM (AS MARKED IN BOTTLE TOP) IN 8 OUNCES OF WATER, MIX AND DRINK ONCE A DAY AS DIRECTED. 850 g 5  . pravastatin (PRAVACHOL) 40 MG tablet TAKE 1 TABLET BY MOUTH EVERY DAY 30 tablet 12  . risperiDONE (RISPERDAL) 2 MG tablet Take 1 tablet (2 mg total) by mouth at bedtime. 90 tablet 0  . traMADol (ULTRAM) 50 MG tablet tramadol 50 mg tablet  Take 1 tablet every 6 hours by oral route as needed.     No current facility-administered medications for this visit.     Musculoskeletal: Strength & Muscle Tone: UTA Gait & Station: UTA Patient leans: N/A  Psychiatric Specialty Exam: Review of Systems  Psychiatric/Behavioral: Negative for agitation, behavioral problems, confusion, decreased concentration, dysphoric mood, hallucinations, self-injury, sleep disturbance and suicidal ideas. The patient is not nervous/anxious and is not hyperactive.   All other systems reviewed and are negative.   There were no vitals taken for this visit.There is no height or weight on file to calculate BMI.  General Appearance: UTA  Eye Contact:  UTA  Speech:  Clear and Coherent  Volume:  Normal  Mood:  Euthymic  Affect:  UTA  Thought Process:  Goal Directed and Descriptions of Associations: Intact  Orientation:  Full (Time, Place, and Person)  Thought Content: Logical   Suicidal Thoughts:  No  Homicidal Thoughts:  No   Memory:  Immediate;   Fair Recent;   Fair Remote;   Fair  Judgement:  Fair  Insight:  Fair  Psychomotor Activity:  UTA  Concentration:  Concentration: Fair and Attention Span: Fair  Recall:  [employer] of Knowledge: Fair  Language: Fair  Akathisia:  No  Handed:  Right  AIMS (if indicated): UTA  Assets:  Communication Skills Desire for Improvement Social Support  ADL's:  Intact  Cognition: WNL  Sleep:  Fair   Screenings: AIMS     Office Visit from 02/23/2018 in [place] Total Score  3    PHQ2-9     Office Visit from 02/16/2017 in [place]  PHQ-2 Total Score  0  PHQ-9 Total Score  0       Assessment and Plan: Hemal is a 69 year old Caucasian male, divorced, lives in [place], has a history of schizophrenia, TD was evaluated by phone today.  Patient is currently stable on current medication regimen.  Plan as noted below.  Plan Schizophrenia-stable Zyprexa 10 mg p.o. nightly Risperidone 2 mg p.o. nightly Trileptal 150 mg p.o. twice daily Patient is on 2 antipsychotic medications, long-term goal is to taper off off to monotherapy.  However when this was attempted previously he did not do well and decompensated. We will continue to monitor patient closely.  Collateral information was obtained from sister as summarized above  TD-chronic Patient declined medications  Follow-up in clinic in 2 to 3 months or sooner if needed.  I have spent atleast 20 minutes non face to face with patient today. More than 50 % of the time was spent for preparing to see the patient ( e.g., review of test, records ), obtaining and to review and separately obtained history , ordering medications and test ,psychoeducation and supportive psychotherapy and care coordination,as well as documenting clinical information in electronic health record. This note was generated in part or whole with voice recognition software. Voice recognition  is usually quite accurate but there are transcription  errors that can and very often do occur. I apologize for any typographical errors that were not detected and corrected.       Ursula Alert, MD 03/21/2020, 12:21 PM

## 2020-03-21 NOTE — Telephone Encounter (Signed)
I have sent Trileptal to pharmacy.

## 2020-03-21 NOTE — Patient Instructions (Signed)
Follow-up in clinic July 9 at 11 AM

## 2020-05-09 NOTE — Progress Notes (Signed)
     Established patient visit   Patient: [name]   DOB: 03/21/1951   69 y.o. Male  MRN: [id number] Visit Date: 05/13/2020  Today's healthcare provider: Lelon Huh, MD   Chief Complaint  Patient presents with  . Rash   Subjective    Rash This is a new problem. The problem is unchanged. The affected locations include the chest and back. The rash is characterized by itchiness. Associated with: bed bugs. Pertinent negatives include no fever, shortness of breath or vomiting. Treatments tried: Medicated powder and OTC cortisone cream.   Patient had bedbugs in his home 2 months ago.     Medications: Outpatient Medications Prior to Visit  Medication Sig  . AMITIZA 24 MCG capsule   . AMITIZA 24 MCG capsule TAKE ONE CAPSULE BY MOUTH TWICE A DAY WITH A MEAL.  . cephALEXin (KEFLEX) 500 MG capsule   . fludrocortisone (FLORINEF) 0.1 MG tablet Take 1 tablet (0.1 mg total) by mouth 2 (two) times daily. TAKE 1 TABLET BY MOUTH TWICE A DAY  . ketoconazole (NIZORAL) 200 MG tablet   . midodrine (PROAMATINE) 10 MG tablet TAKE 1 TABLET BY MOUTH 3 TIMES A DAY WITH MEALS  . OLANZapine (ZYPREXA) 10 MG tablet Take 1 tablet (10 mg total) by mouth at bedtime.  . OXcarbazepine (TRILEPTAL) 150 MG tablet TAKE 1 TABLET BY MOUTH TWICE A DAY  . PARoxetine (PAXIL) 10 MG tablet Take 1 tablet (10 mg total) by mouth daily.  . polyethylene glycol powder (GLYCOLAX/MIRALAX) 17 GM/SCOOP powder MIX 17GM (AS MARKED IN BOTTLE TOP) IN 8 OUNCES OF WATER, MIX AND DRINK ONCE A DAY AS DIRECTED.  Marland Kitchen pravastatin (PRAVACHOL) 40 MG tablet TAKE 1 TABLET BY MOUTH EVERY DAY  . risperiDONE (RISPERDAL) 2 MG tablet Take 1 tablet (2 mg total) by mouth at bedtime.  . traMADol (ULTRAM) 50 MG tablet tramadol 50 mg tablet  Take 1 tablet every 6 hours by oral route as needed.   No facility-administered medications prior to visit.    Review of Systems  Constitutional: Negative for appetite change, chills and fever.    Respiratory: Negative for chest tightness, shortness of breath and wheezing.   Cardiovascular: Negative for chest pain and palpitations.  Gastrointestinal: Negative for abdominal pain, nausea and vomiting.  Skin: Positive for rash.    Objective    BP (!) 170/96 (BP Location: Left Arm, Patient Position: Sitting, Cuff Size: Large)   Pulse 60   Temp (!) 97.5 F (36.4 C)   Resp 18   Wt 255 lb (115.7 kg)   BMI 34.58 kg/m   Physical Exam  Large irregular patches of hypopigmented lesions across back and chest consistently with tinea Versicolor. No urticarial lesions noted.   No results found for any visits on 05/13/20.  Assessment & Plan     1. Tinea versicolor  - terbinafine (LAMISIL) 1 % cream; Apply 1 application topically 2 (two) times daily. For 7 days  Dispense: 42 g; Refill: 0 Call if symptoms change or if not rapidly improving.      No follow-ups on file.      The entirety of the information documented in the History of Present Illness, Review of Systems and Physical Exam were personally obtained by me. Portions of this information were initially documented by the CMA and reviewed by me for thoroughness and accuracy.      Lelon Huh, MD  [facility] [phone] (phone) [phone] (fax)  [place]

## 2020-05-13 ENCOUNTER — Ambulatory Visit: Payer: Medicare Other | Admitting: Family Medicine

## 2020-05-13 ENCOUNTER — Ambulatory Visit (INDEPENDENT_AMBULATORY_CARE_PROVIDER_SITE_OTHER): Payer: Medicare Other | Admitting: Family Medicine

## 2020-05-13 ENCOUNTER — Encounter: Payer: Self-pay | Admitting: Family Medicine

## 2020-05-13 ENCOUNTER — Other Ambulatory Visit: Payer: Self-pay

## 2020-05-13 VITALS — BP 170/96 | HR 60 | Temp 97.5°F | Resp 18 | Wt 255.0 lb

## 2020-05-13 DIAGNOSIS — B360 Pityriasis versicolor: Secondary | ICD-10-CM

## 2020-05-13 MED ORDER — TERBINAFINE HCL 1 % EX CREA: 1.0000 "application " | TOPICAL_CREAM | Freq: Two times a day (BID) | CUTANEOUS | 0 refills | Status: DC

## 2020-05-13 NOTE — Patient Instructions (Signed)
.   Please review the attached list of medications and notify my office if there are any errors.   . Please bring all of your medications to every appointment so we can make sure that our medication list is the same as yours.   

## 2020-06-18 ENCOUNTER — Other Ambulatory Visit: Payer: Self-pay | Admitting: Psychiatry

## 2020-06-18 DIAGNOSIS — G2401 Drug induced subacute dyskinesia: Secondary | ICD-10-CM

## 2020-06-18 DIAGNOSIS — F209 Schizophrenia, unspecified: Secondary | ICD-10-CM

## 2020-06-19 ENCOUNTER — Other Ambulatory Visit: Payer: Self-pay | Admitting: Psychiatry

## 2020-06-19 DIAGNOSIS — F209 Schizophrenia, unspecified: Secondary | ICD-10-CM

## 2020-06-19 DIAGNOSIS — G2401 Drug induced subacute dyskinesia: Secondary | ICD-10-CM

## 2020-06-20 ENCOUNTER — Telehealth (INDEPENDENT_AMBULATORY_CARE_PROVIDER_SITE_OTHER): Payer: Medicare Other | Admitting: Psychiatry

## 2020-06-20 ENCOUNTER — Other Ambulatory Visit: Payer: Self-pay

## 2020-06-20 ENCOUNTER — Telehealth: Payer: Self-pay | Admitting: Psychiatry

## 2020-06-20 ENCOUNTER — Telehealth: Payer: Self-pay

## 2020-06-20 ENCOUNTER — Encounter: Payer: Self-pay | Admitting: Psychiatry

## 2020-06-20 DIAGNOSIS — F419 Anxiety disorder, unspecified: Secondary | ICD-10-CM

## 2020-06-20 DIAGNOSIS — F09 Unspecified mental disorder due to known physiological condition: Secondary | ICD-10-CM

## 2020-06-20 DIAGNOSIS — G2401 Drug induced subacute dyskinesia: Secondary | ICD-10-CM

## 2020-06-20 DIAGNOSIS — Z79899 Other long term (current) drug therapy: Secondary | ICD-10-CM

## 2020-06-20 DIAGNOSIS — F23 Brief psychotic disorder: Secondary | ICD-10-CM | POA: Diagnosis not present

## 2020-06-20 DIAGNOSIS — Z9189 Other specified personal risk factors, not elsewhere classified: Secondary | ICD-10-CM | POA: Diagnosis not present

## 2020-06-20 MED ORDER — PAROXETINE HCL 10 MG PO TABS: 10.0000 mg | ORAL_TABLET | Freq: Every day | ORAL | 0 refills | Status: DC

## 2020-06-20 MED ORDER — RISPERIDONE 2 MG PO TABS: 2.0000 mg | ORAL_TABLET | Freq: Every day | ORAL | 0 refills | Status: DC

## 2020-06-20 MED ORDER — OLANZAPINE 15 MG PO TABS: 15.0000 mg | ORAL_TABLET | Freq: Every day | ORAL | 0 refills | Status: DC

## 2020-06-20 NOTE — Telephone Encounter (Signed)
Since patient has recent impulsivity, disinhibition, I suspect early dementia. Will refer patient to neurology.

## 2020-06-20 NOTE — Telephone Encounter (Signed)
she wanted the labwork orders mail and if posslbe faxed to dr. Caryn Section office.

## 2020-06-20 NOTE — Telephone Encounter (Signed)
labwork orders mailed  

## 2020-06-20 NOTE — Progress Notes (Addendum)
Provider Location : [facility] Patient Location : Home  Virtual Visit via Telephone [name]  I connected with [name] on 06/20/20 at 11:00 AM EDT by telephone and verified that I am speaking with the correct person using two identifiers.   I discussed the limitations, risks, security and privacy concerns of performing an evaluation and management service by telephone and the availability of in person appointments. I also discussed with the patient that there may be a patient responsible charge related to this service. The patient expressed understanding and agreed to proceed.      I discussed the assessment and treatment plan with the patient. The patient was provided an opportunity to ask questions and all were answered. The patient agreed with the plan and demonstrated an understanding of the instructions.   The patient was advised to call back or seek an in-person evaluation if the symptoms worsen or if the condition fails to improve as anticipated.   [name]  06/20/2020 1:56 PM SAVVA BEAMER  MRN:  [id number]  Chief Complaint:  Chief Complaint    Follow-up     HPI: [name] is a 69 year old Caucasian male, divorced, lives in [place], has a history of schizophrenia, tardive dyskinesia was evaluated by phone today.  Patient preferred to do a phone call.  Patient being a limited historian collateral information was obtained from sisterStanton [name].  Per sister patient lately has been exhibiting some behavioral problems.  She reports he  recently made a sexual comment to her.  She reports she is not worried about her safety at the home.  She however believes he may need his medications to be readjusted and likely is not doing well on the reduced dosage of his Zyprexa.  Patient's medications were reduced with a plan to taper him off to monotherapy with regards to his antipsychotics.  However it is likely he is not tolerating the tapering well.  Patient denies any  suicidality, homicidality or perceptual disturbances.  Patient appeared to be cooperative in session today.  Discussed with patient as well as sister that patient will also benefit from psychotherapy sessions and writer will send a referral to a therapist here in office.  Patient denies any other concerns today.  Visit Diagnosis:  R/O Neurocognitive disorder    ICD-10-CM   1. Schizophrenia, acute (HCC)  F23 OLANZapine (ZYPREXA) 15 MG tablet  2. Tardive dyskinesia  G24.01   3. Anxiety disorder, unspecified type  F41.9 PARoxetine (PAXIL) 10 MG tablet    risperiDONE (RISPERDAL) 2 MG tablet  4. At risk for long QT syndrome  Z91.89   5. Cognitive disorder  F09    unspecified  6. High risk medication use  Z79.899 CBC With Differential    Comprehensive metabolic panel    Past Psychiatric History: I have reviewed past psychiatric history from my progress [name] on 02/23/2018.  Past trials of Haldol, Klonopin, Seroquel  Past Medical History:  Past Medical History:  Diagnosis Date  . Chicken pox   . Dizziness   . Measles   . Mumps   . Schizophrenia [facility])     Past Surgical History:  Procedure Laterality Date  . ABDOMINAL SURGERY    . COLON SURGERY    . LAPAROTOMY N/A 03/20/2016   Procedure: EXPLORATORY LAPAROTOMY with lysis of adhesions;  Surgeon: Jules Husbands, MD;  Location: [facility] ORS;  Service: General;  Laterality: N/A;  . None      Family Psychiatric History: I have  reviewed family psychiatric history from my progress [name] on 02/23/2018.  Family History:  Family History  Problem Relation Age of Onset  . Diabetes Mother   . Heart disease Mother   . Stroke Mother   . Diabetes Father   . Hypertension Father   . Lung cancer Father   . Cancer Father        lung cancer  . Schizophrenia Sister   . Lung cancer Sister   . Colon cancer Sister     Social History: I have reviewed social history from my progress [name] on 02/23/2018. Social History   Socioeconomic History  . Marital  status: Divorced    Spouse name: Not on file  . Number of children: 2  . Years of education: Not on file  . Highest education level: High school graduate  Occupational History  . Occupation: Disabled  Tobacco Use  . Smoking status: Former Smoker    Types: Cigarettes    Quit date: 08/04/2004    Years since quitting: 15.8  . Smokeless tobacco: Former Systems developer    Quit date: 08/04/2004  Vaping Use  . Vaping Use: Never used  Substance and Sexual Activity  . Alcohol use: No    Alcohol/week: 0.0 standard drinks  . Drug use: No  . Sexual activity: Not Currently  Other Topics Concern  . Not on file  Social History Narrative  . Not on file   Social Determinants of Health   Financial Resource Strain:   . Difficulty of Paying Living Expenses:   Food Insecurity:   . Worried About Charity fundraiser in the Last Year:   . Arboriculturist in the Last Year:   Transportation Needs:   . Film/video editor (Medical):   Marland Kitchen Lack of Transportation (Non-Medical):   Physical Activity:   . Days of Exercise per Week:   . Minutes of Exercise per Session:   Stress:   . Feeling of Stress :   Social Connections:   . Frequency of Communication with Friends and Family:   . Frequency of Social Gatherings with Friends and Family:   . Attends Religious Services:   . Active Member of Clubs or Organizations:   . Attends Archivist Meetings:   Marland Kitchen Marital Status:     Allergies:  Allergies  Allergen Reactions  . Vitamin B12 Other (See Comments)    interacted with other medications     Metabolic Disorder Labs: No results found for: HGBA1C, MPG No results found for: PROLACTIN Lab Results  Component Value Date   CHOL 155 06/14/2019   TRIG 123 06/14/2019   HDL 39 (L) 06/14/2019   CHOLHDL 4.0 06/14/2019   LDLCALC 91 06/14/2019   LDLCALC 142 (H) 02/16/2017   Lab Results  Component Value Date   TSH 2.420 06/14/2019   TSH 1.333 05/04/2016    Therapeutic Level Labs: No results found  for: LITHIUM No results found for: VALPROATE No components found for:  CBMZ  Current Medications: Current Outpatient Medications  Medication Sig Dispense Refill  . AMITIZA 24 MCG capsule     . AMITIZA 24 MCG capsule TAKE ONE CAPSULE BY MOUTH TWICE A DAY WITH A MEAL. 60 capsule 12  . cephALEXin (KEFLEX) 500 MG capsule     . fludrocortisone (FLORINEF) 0.1 MG tablet Take 1 tablet (0.1 mg total) by mouth 2 (two) times daily. TAKE 1 TABLET BY MOUTH TWICE A DAY 180 tablet 3  . ketoconazole (NIZORAL) 200 MG tablet     .  midodrine (PROAMATINE) 10 MG tablet TAKE 1 TABLET BY MOUTH 3 TIMES A DAY WITH MEALS 270 tablet 3  . OLANZapine (ZYPREXA) 15 MG tablet Take 1 tablet (15 mg total) by mouth at bedtime. 90 tablet 0  . OXcarbazepine (TRILEPTAL) 150 MG tablet TAKE 1 TABLET BY MOUTH 2 TIMES DAILY 180 tablet 3  . PARoxetine (PAXIL) 10 MG tablet Take 1 tablet (10 mg total) by mouth daily. 90 tablet 0  . polyethylene glycol powder (GLYCOLAX/MIRALAX) 17 GM/SCOOP powder MIX 17GM (AS MARKED IN BOTTLE TOP) IN 8 OUNCES OF WATER, MIX AND DRINK ONCE A DAY AS DIRECTED. 850 g 5  . pravastatin (PRAVACHOL) 40 MG tablet TAKE 1 TABLET BY MOUTH EVERY DAY 30 tablet 12  . risperiDONE (RISPERDAL) 2 MG tablet Take 1 tablet (2 mg total) by mouth at bedtime. 90 tablet 0  . terbinafine (LAMISIL) 1 % cream Apply 1 application topically 2 (two) times daily. For 7 days 42 g 0  . traMADol (ULTRAM) 50 MG tablet tramadol 50 mg tablet  Take 1 tablet every 6 hours by oral route as needed.     No current facility-administered medications for this visit.     Musculoskeletal: Strength & Muscle Tone: UTA Gait & Station: UTA Patient leans: N/A  Psychiatric Specialty Exam: Review of Systems  Psychiatric/Behavioral: The patient is nervous/anxious.   All other systems reviewed and are negative.   There were no vitals taken for this visit.There is no height or weight on file to calculate BMI.  General Appearance: UTA  Eye Contact:   UTA  Speech:  Clear and Coherent  Volume:  Normal  Mood:  Anxious  Affect:  UTA  Thought Process:  Goal Directed and Descriptions of Associations: Intact  Orientation:  Full (Time, Place, and Person)  Thought Content: Logical   Suicidal Thoughts:  No  Homicidal Thoughts:  No  Memory:  Immediate;   Fair Recent;   Fair Remote;   limited  Judgement:  Fair  Insight:  Fair  Psychomotor Activity:  UTA  Concentration:  Concentration: Fair and Attention Span: Fair  Recall:  [employer] of Knowledge: Fair  Language: Fair  Akathisia:  No  Handed:  Right  AIMS (if indicated): UTA  Assets:  Desire for Improvement Housing Social Support  ADL's:  Intact  Cognition: WNL  Sleep:  Fair   Screenings: AIMS     Office Visit from 02/23/2018 in [place] Total Score 3    PHQ2-9     Office Visit from 05/13/2020 in [place] Visit from 02/16/2017 in [place]  PHQ-2 Total Score 0 0  PHQ-9 Total Score -- 0       Assessment and Plan: SAMIK BALKCOM is a 69 year old Caucasian male, divorced, lives in [place], on [place], has a history of schizophrenia, GAD was evaluated by phone today.  Patient with recent behavioral problems, anxiety will benefit from medication readjustment and psychotherapy sessions.  Plan as noted below.  Plan Schizophrenia-unstable Increase Zyprexa to 15 mg p.o. nightly Continue risperidone 2 mg p.o. nightly Trileptal 150 mg p.o. twice daily Patient is on 2 antipsychotic medication and it was discussed with patient as well as sister about the long-term risk of being on multiple antipsychotics.  The plan was to taper him off to monotherapy and hence the Zyprexa dosage was previously decreased.  Patient however does not seem to be tolerating it well. Discussed with patient as well as sister that since  his Zyprexa is being increased again he will benefit from a repeat EKG due to the history of prolonged  QT syndrome. They will go to his primary care provider to get that done. Also will refer patient for behavioral therapy with therapist here in our office.  Anxiety symptoms unspecified-unstable Continue Paxil as prescribed. We will refer for therapy.  TD-chronic Patient declines medications  R/O Neurocognitive disorder/ Dementia - Patient with recent disinhibition,behavioral problems - will refer to neurology.  At risk for prolonged QT syndrome-repeat EKG.  Follow-up in clinic in 6 to 8 weeks or sooner if needed.  I have spent atleast 20 minutes non face to face with patient today. More than 50 % of the time was spent for preparing to see the patient ( e.g., review of test, records ), ordering medications and test ,psychoeducation and supportive psychotherapy and care coordination,as well as documenting clinical information in electronic health record. This [name] was generated in part or whole with voice recognition software. Voice recognition is usually quite accurate but there are transcription errors that can and very often do occur. I apologize for any typographical errors that were not detected and corrected.       Ursula Alert, MD 06/20/2020, 1:56 PM

## 2020-06-20 NOTE — Telephone Encounter (Signed)
faxed and confirmed labwork orders  

## 2020-06-21 DIAGNOSIS — F09 Unspecified mental disorder due to known physiological condition: Secondary | ICD-10-CM | POA: Insufficient documentation

## 2020-06-26 ENCOUNTER — Encounter: Payer: Medicare Other | Admitting: Licensed Clinical Social Worker

## 2020-06-26 ENCOUNTER — Other Ambulatory Visit: Payer: Self-pay

## 2020-07-01 NOTE — Progress Notes (Signed)
This encounter was created in error - please disregard.

## 2020-07-11 ENCOUNTER — Telehealth: Payer: Self-pay

## 2020-07-11 NOTE — Telephone Encounter (Signed)
referral faxed and confirmed to [facility] clinic neurology- appt pending

## 2020-07-30 ENCOUNTER — Encounter: Payer: Self-pay | Admitting: Family Medicine

## 2020-08-06 ENCOUNTER — Other Ambulatory Visit: Payer: Self-pay | Admitting: Family Medicine

## 2020-08-06 DIAGNOSIS — I951 Orthostatic hypotension: Secondary | ICD-10-CM

## 2020-08-06 NOTE — Telephone Encounter (Signed)
Requested medication (s) are due for refill today: no  Requested medication (s) are on the active medication list: yes  Last refill:  04/16/2020  Future visit scheduled: yes  Notes to clinic:  this refill cannot be delegated    Requested Prescriptions  Pending Prescriptions Disp Refills   midodrine (PROAMATINE) 10 MG tablet [Pharmacy Med Name: MIDODRINE HCL 10 MG TAB] 270 tablet 3    Sig: TAKE 1 TABLET BY MOUTH 3 TIMES A DAY WITH MEALS      Not Delegated - Cardiovascular: Midodrine Failed - 08/06/2020 12:01 PM      Failed - This refill cannot be delegated      Failed - Last BP in normal range    BP Readings from Last 1 Encounters:  05/13/20 (!) 170/96          Passed - Valid encounter within last 12 months    Recent Outpatient Visits           2 months ago Matthews, Donald E, MD   1 year ago High risk medication use   [facility] Birdie Sons, MD   2 years ago Cellulitis of finger of right hand   [facility] Birdie Sons, MD   2 years ago Rt flank pain   [facility] Birdie Sons, MD   3 years ago Bilateral impacted cerumen   [facility] Birdie Sons, MD       Future Appointments             In 1 month Fisher, Kirstie Peri, MD [facility] [place]

## 2020-08-21 ENCOUNTER — Other Ambulatory Visit: Payer: Self-pay | Admitting: Family Medicine

## 2020-08-22 ENCOUNTER — Telehealth (INDEPENDENT_AMBULATORY_CARE_PROVIDER_SITE_OTHER): Payer: Medicare Other | Admitting: Psychiatry

## 2020-08-22 ENCOUNTER — Other Ambulatory Visit: Payer: Self-pay

## 2020-08-22 DIAGNOSIS — F411 Generalized anxiety disorder: Secondary | ICD-10-CM | POA: Insufficient documentation

## 2020-08-22 DIAGNOSIS — F419 Anxiety disorder, unspecified: Secondary | ICD-10-CM

## 2020-08-22 DIAGNOSIS — F23 Brief psychotic disorder: Secondary | ICD-10-CM | POA: Insufficient documentation

## 2020-08-22 DIAGNOSIS — Z79899 Other long term (current) drug therapy: Secondary | ICD-10-CM

## 2020-08-22 DIAGNOSIS — F09 Unspecified mental disorder due to known physiological condition: Secondary | ICD-10-CM

## 2020-08-22 DIAGNOSIS — G2401 Drug induced subacute dyskinesia: Secondary | ICD-10-CM

## 2020-08-22 DIAGNOSIS — Z9189 Other specified personal risk factors, not elsewhere classified: Secondary | ICD-10-CM

## 2020-08-22 NOTE — Progress Notes (Signed)
No response to call or text or video invite  

## 2020-08-26 ENCOUNTER — Telehealth (INDEPENDENT_AMBULATORY_CARE_PROVIDER_SITE_OTHER): Payer: Medicare Other | Admitting: Psychiatry

## 2020-08-26 ENCOUNTER — Other Ambulatory Visit: Payer: Self-pay

## 2020-08-26 DIAGNOSIS — Z5329 Procedure and treatment not carried out because of patient's decision for other reasons: Secondary | ICD-10-CM

## 2020-08-26 NOTE — Progress Notes (Signed)
No response to call or text or video invite  

## 2020-08-27 ENCOUNTER — Telehealth: Payer: Self-pay

## 2020-08-27 ENCOUNTER — Telehealth (INDEPENDENT_AMBULATORY_CARE_PROVIDER_SITE_OTHER): Payer: Medicare Other | Admitting: Psychiatry

## 2020-08-27 ENCOUNTER — Encounter: Payer: Self-pay | Admitting: Psychiatry

## 2020-08-27 DIAGNOSIS — G2401 Drug induced subacute dyskinesia: Secondary | ICD-10-CM

## 2020-08-27 DIAGNOSIS — F203 Undifferentiated schizophrenia: Secondary | ICD-10-CM | POA: Diagnosis not present

## 2020-08-27 DIAGNOSIS — F411 Generalized anxiety disorder: Secondary | ICD-10-CM

## 2020-08-27 DIAGNOSIS — Z9189 Other specified personal risk factors, not elsewhere classified: Secondary | ICD-10-CM | POA: Diagnosis not present

## 2020-08-27 DIAGNOSIS — Z79899 Other long term (current) drug therapy: Secondary | ICD-10-CM

## 2020-08-27 DIAGNOSIS — F09 Unspecified mental disorder due to known physiological condition: Secondary | ICD-10-CM

## 2020-08-27 DIAGNOSIS — Z91199 Patient's noncompliance with other medical treatment and regimen due to unspecified reason: Secondary | ICD-10-CM

## 2020-08-27 DIAGNOSIS — Z9111 Patient's noncompliance with dietary regimen: Secondary | ICD-10-CM

## 2020-08-27 MED ORDER — PAROXETINE HCL 10 MG PO TABS: 10.0000 mg | ORAL_TABLET | Freq: Every day | ORAL | 0 refills | Status: DC

## 2020-08-27 MED ORDER — RISPERIDONE 2 MG PO TABS: 2.0000 mg | ORAL_TABLET | Freq: Every day | ORAL | 0 refills | Status: DC

## 2020-08-27 MED ORDER — OLANZAPINE 15 MG PO TABS: 15.0000 mg | ORAL_TABLET | Freq: Every day | ORAL | 0 refills | Status: DC

## 2020-08-27 NOTE — Telephone Encounter (Signed)
Copied from [place] [id number]. Topic: General - Call Back - No Documentation >> Aug 27, 2020 11:38 AM Erick Blinks wrote: Pt's sister has questions regarding pt's lab records [facility]) Best contact: [phone]

## 2020-08-27 NOTE — Progress Notes (Signed)
Provider Location : [facility] Patient Location : Home  Participants: Patient ,Sister, Provider  Virtual Visit via Telephone [name]  I connected with [name] on 08/27/20 at 11:00 AM EDT by telephone and verified that I am speaking with the correct person using two identifiers.   I discussed the limitations, risks, security and privacy concerns of performing an evaluation and management service by telephone and the availability of in person appointments. I also discussed with the patient that there may be a patient responsible charge related to this service. The patient expressed understanding and agreed to proceed.      I discussed the assessment and treatment plan with the patient. The patient was provided an opportunity to ask questions and all were answered. The patient agreed with the plan and demonstrated an understanding of the instructions.   The patient was advised to call back or seek an in-person evaluation if the symptoms worsen or if the condition fails to improve as anticipated.  [name]  08/27/2020 12:34 PM [name]  MRN:  [id number]  Chief Complaint:  Chief Complaint    Follow-up     HPI: [name] is a 68 year old Caucasian male, divorced, lives in [place], has a history of schizophrenia, tardive dyskinesia was evaluated by phone today.  Patient being a limited historian collateral information was provided by sister-Mary.  Per sister patient is currently making progress on the current medication regimen.  She reports the dosage increase has definitely helped.  He does have some days when he needs more support however overall he is doing okay.  Patient reports he is tolerating medications well.  He denies side effects.  Per sister he never followed up with neurology as recommended at last visit.  She reports she never got a call from neurology.  She also reports that she was able to get all the labs done as well as EKG however per review  of E HR I do not see any recent labs as reported.  I also do not see a recent EKG.  His medication dosage-his antipsychotic-Zyprexa was increased due to behavioral disturbances and due to his history of prolonged QT syndrome patient was advised to get a repeat EKG to monitor for the same.  Patient denies any suicidality, homicidality.  Patient denies any perceptual disturbances.    Visit Diagnosis:    ICD-10-CM   1. 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 Tardive dyskinesia  G24.01   3. GAD (generalized anxiety disorder)  F41.1 PARoxetine (PAXIL) 10 MG tablet    risperiDONE (RISPERDAL) 2 MG tablet  4. At risk for long QT syndrome  Z91.89   5. Cognitive disorder  F09   6. High risk medication use  Z79.899     Past Psychiatric History: I have reviewed past psychiatric history from my progress [name] on 02/23/2018.  Past trials of Haldol, Klonopin, Seroquel  Past Medical History:  Past Medical History:  Diagnosis Date  . Chicken pox   . Dizziness   . Measles   . Mumps   . Schizophrenia [facility])     Past Surgical History:  Procedure Laterality Date  . ABDOMINAL SURGERY    . COLON SURGERY    . LAPAROTOMY N/A 03/20/2016   Procedure: EXPLORATORY LAPAROTOMY with lysis of adhesions;  Surgeon: Jules Husbands, MD;  Location: [facility] ORS;  Service: General;  Laterality: N/A;  . None      Family Psychiatric History: I have  reviewed family psychiatric history from my progress [name] on 02/23/2018  Family History:  Family History  Problem Relation Age of Onset  . Diabetes Mother   . Heart disease Mother   . Stroke Mother   . Diabetes Father   . Hypertension Father   . Lung cancer Father   . Cancer Father        lung cancer  . Schizophrenia Sister   . Lung cancer Sister   . Colon cancer Sister     Social History: I have reviewed social history from my progress [name] on 02/23/2018 Social History   Socioeconomic History  . Marital status: Divorced     Spouse name: Not on file  . Number of children: 2  . Years of education: Not on file  . Highest education level: High school graduate  Occupational History  . Occupation: Disabled  Tobacco Use  . Smoking status: Former Smoker    Types: Cigarettes    Quit date: 08/04/2004    Years since quitting: 16.0  . Smokeless tobacco: Former Systems developer    Quit date: 08/04/2004  Vaping Use  . Vaping Use: Never used  Substance and Sexual Activity  . Alcohol use: No    Alcohol/week: 0.0 standard drinks  . Drug use: No  . Sexual activity: Not Currently  Other Topics Concern  . Not on file  Social History Narrative  . Not on file   Social Determinants of Health   Financial Resource Strain:   . Difficulty of Paying Living Expenses: Not on file  Food Insecurity:   . Worried About Charity fundraiser in the Last Year: Not on file  . Ran Out of Food in the Last Year: Not on file  Transportation Needs:   . Lack of Transportation (Medical): Not on file  . Lack of Transportation (Non-Medical): Not on file  Physical Activity:   . Days of Exercise per Week: Not on file  . Minutes of Exercise per Session: Not on file  Stress:   . Feeling of Stress : Not on file  Social Connections:   . Frequency of Communication with Friends and Family: Not on file  . Frequency of Social Gatherings with Friends and Family: Not on file  . Attends Religious Services: Not on file  . Active Member of Clubs or Organizations: Not on file  . Attends Archivist Meetings: Not on file  . Marital Status: Not on file    Allergies:  Allergies  Allergen Reactions  . Vitamin B12 Other (See Comments)    interacted with other medications     Metabolic Disorder Labs: No results found for: HGBA1C, MPG No results found for: PROLACTIN Lab Results  Component Value Date   CHOL 155 06/14/2019   TRIG 123 06/14/2019   HDL 39 (L) 06/14/2019   CHOLHDL 4.0 06/14/2019   LDLCALC 91 06/14/2019   LDLCALC 142 (H)  02/16/2017   Lab Results  Component Value Date   TSH 2.420 06/14/2019   TSH 1.333 05/04/2016    Therapeutic Level Labs: No results found for: LITHIUM No results found for: VALPROATE No components found for:  CBMZ  Current Medications: Current Outpatient Medications  Medication Sig Dispense Refill  . AMITIZA 24 MCG capsule     . AMITIZA 24 MCG capsule TAKE ONE CAPSULE BY MOUTH TWICE A DAY WITH A MEAL. 60 capsule 12  . cephALEXin (KEFLEX) 500 MG capsule     . fludrocortisone (FLORINEF) 0.1 MG tablet Take 1 tablet (  0.1 mg total) by mouth 2 (two) times daily. TAKE 1 TABLET BY MOUTH TWICE A DAY 180 tablet 3  . ketoconazole (NIZORAL) 200 MG tablet     . midodrine (PROAMATINE) 10 MG tablet TAKE 1 TABLET BY MOUTH 3 TIMES A DAY WITH MEALS 270 tablet 3  . OLANZapine (ZYPREXA) 15 MG tablet Take 1 tablet (15 mg total) by mouth at bedtime. 90 tablet 0  . OXcarbazepine (TRILEPTAL) 150 MG tablet TAKE 1 TABLET BY MOUTH 2 TIMES DAILY 180 tablet 3  . PARoxetine (PAXIL) 10 MG tablet Take 1 tablet (10 mg total) by mouth daily. 90 tablet 0  . polyethylene glycol powder (GLYCOLAX/MIRALAX) 17 GM/SCOOP powder MIX 17GM (AS MARKED IN BOTTLE TOP) IN 8 OUNCES OF WATER, MIX AND DRINK ONCE A DAY AS DIRECTED. 850 g 5  . pravastatin (PRAVACHOL) 40 MG tablet TAKE 1 TABLET BY MOUTH DAILY 30 tablet 12  . risperiDONE (RISPERDAL) 2 MG tablet Take 1 tablet (2 mg total) by mouth at bedtime. 90 tablet 0  . terbinafine (LAMISIL) 1 % cream Apply 1 application topically 2 (two) times daily. For 7 days 42 g 0  . traMADol (ULTRAM) 50 MG tablet tramadol 50 mg tablet  Take 1 tablet every 6 hours by oral route as needed.     No current facility-administered medications for this visit.     Musculoskeletal: Strength & Muscle Tone: UTA Gait & Station: UTA Patient leans: N/A  Psychiatric Specialty Exam: Review of Systems  Unable to perform ROS: Other (Patient with cognitive dysfunction)    There were no vitals taken for  this visit.There is no height or weight on file to calculate BMI.  General Appearance: UTA  Eye Contact:  UTA  Speech:  Clear and Coherent  Volume:  Normal  Mood:  Euthymic  Affect:  UTA  Thought Process:  Goal Directed and Descriptions of Associations: Intact  Orientation:  Full (Time, Place, and Person)  Thought Content: Logical   Suicidal Thoughts:  No  Homicidal Thoughts:  No  Memory:  Immediate;   Fair Recent;   Fair Remote;   Fair  Judgement:  Fair  Insight:  Fair  Psychomotor Activity:  UTA  Concentration:  Concentration: limited and Attention Span:limited  Recall:  [employer] of Knowledge: Fair  Language: Fair  Akathisia:  No  Handed:  Right  AIMS (if indicated):UTA  Assets:  Housing Social Support  ADL's:  Intact  Cognition:limited  Sleep:  Fair   Screenings: AIMS     Office Visit from 02/23/2018 in [place] Total Score 3    PHQ2-9     Office Visit from 05/13/2020 in [place] Visit from 02/16/2017 in [place]  PHQ-2 Total Score 0 0  PHQ-9 Total Score -- 0       Assessment and Plan: [name] is a 69 year old Caucasian male, divorced, lives in [place], [place], has a history of schizophrenia, GAD was evaluated by phone today.  Patient is currently making progress however due to recent changes in his behavior including impulsivity, he was referred for neurology evaluation.  However patient has not followed through with recommendation.  Patient also has been noncompliant with labs as well as EKG.  Discussed plan as noted below.  Plan Schizophrenia-stable Zyprexa 15 mg p.o. nightly Risperidone 2 mg p.o. nightly Trileptal 150 mg p.o. twice daily Patient is on 2 antipsychotic medication and it was discussed with patient as well as sister  about the long-term risk of being on multiple antipsychotics.  The plan was to taper him off to monotherapy however he did not tolerate  it.   GAD-improving Paxil as prescribed Patient was referred for therapy-noncompliant   GAD-chronic Patient declined medications  Rule out neurocognitive disorder/dementia-patient with recent disinhibition, behavioral problems was referred to neurology-pending.  Encouraged patient as well as sister to follow-up with neurology.  At risk for QT syndrome-pending repeat EKG.  Encouraged patient and sister to get it done.  Pending labs including CBC with differential and CMP-noncompliant.  Patient with noncompliance with labs, EKG and follow-up with neurology-provided education.  Follow-up in clinic in 6 to 8 weeks or sooner if needed.  I have spent atleast 20 minutes non face to face with patient today. More than 50 % of the time was spent for preparing to see the patient ( e.g., review of test, records ), obtaining and to review and separately obtained history , ordering medications and test ,psychoeducation and supportive psychotherapy and care coordination,as well as documenting clinical information in electronic health record. This [name] was generated in part or whole with voice recognition software. Voice recognition is usually quite accurate but there are transcription errors that can and very often do occur. I apologize for any typographical errors that were not detected and corrected.       Ursula Alert, MD 08/27/2020, 12:34 PM

## 2020-09-07 DIAGNOSIS — K5900 Constipation, unspecified: Secondary | ICD-10-CM | POA: Diagnosis not present

## 2020-09-07 DIAGNOSIS — E785 Hyperlipidemia, unspecified: Secondary | ICD-10-CM | POA: Diagnosis not present

## 2020-09-07 DIAGNOSIS — N183 Chronic kidney disease, stage 3 unspecified: Secondary | ICD-10-CM | POA: Diagnosis not present

## 2020-09-07 DIAGNOSIS — Z87891 Personal history of nicotine dependence: Secondary | ICD-10-CM | POA: Diagnosis not present

## 2020-09-07 DIAGNOSIS — M545 Low back pain: Secondary | ICD-10-CM | POA: Diagnosis not present

## 2020-09-07 DIAGNOSIS — G2401 Drug induced subacute dyskinesia: Secondary | ICD-10-CM | POA: Diagnosis not present

## 2020-09-29 NOTE — Progress Notes (Signed)
Annual Wellness Visit     Patient: [name], Male    DOB: [birth year], 69 y.o.   MRN: [id number] Visit Date: 09/30/2020  Today's Provider: Lelon Huh, MD   No chief complaint on file.  Subjective    [name] is a 69 y.o. male who presents today for his Annual Wellness Visit. He reports consuming a general diet. Home exercise routine includes walking 2-3 times weekly . He generally feels fairly well. He reports sleeping fairly well. He does not have additional problems to discuss today.    Current diet: regular diet Current exercise: walking    ---------------------------------------------------------------------------------------------------     Medications: Outpatient Medications Prior to Visit  Medication Sig  . fludrocortisone (FLORINEF) 0.1 MG tablet Take 1 tablet (0.1 mg total) by mouth 2 (two) times daily. TAKE 1 TABLET BY MOUTH TWICE A DAY  . ketoconazole (NIZORAL) 200 MG tablet   . midodrine (PROAMATINE) 10 MG tablet TAKE 1 TABLET BY MOUTH 3 TIMES A DAY WITH MEALS  . OLANZapine (ZYPREXA) 15 MG tablet Take 1 tablet (15 mg total) by mouth at bedtime.  . OXcarbazepine (TRILEPTAL) 150 MG tablet TAKE 1 TABLET BY MOUTH 2 TIMES DAILY  . PARoxetine (PAXIL) 10 MG tablet Take 1 tablet (10 mg total) by mouth daily.  . polyethylene glycol (MIRALAX / GLYCOLAX) 17 g packet Take 17 g by mouth daily.  . pravastatin (PRAVACHOL) 40 MG tablet TAKE 1 TABLET BY MOUTH DAILY  . Psyllium (METAMUCIL) 0.36 g CAPS Take by mouth.  . risperiDONE (RISPERDAL) 2 MG tablet Take 1 tablet (2 mg total) by mouth at bedtime.  . terbinafine (LAMISIL) 1 % cream Apply 1 application topically 2 (two) times daily. For 7 days  . traMADol (ULTRAM) 50 MG tablet tramadol 50 mg tablet  Take 1 tablet every 6 hours by oral route as needed.  . AMITIZA 24 MCG capsule TAKE ONE CAPSULE BY MOUTH TWICE A DAY WITH A MEAL. (Patient not taking: Reported on 09/30/2020)  . [DISCONTINUED] AMITIZA 24 MCG  capsule   . [DISCONTINUED] cephALEXin (KEFLEX) 500 MG capsule  (Patient not taking: Reported on 09/30/2020)  . [DISCONTINUED] polyethylene glycol powder (GLYCOLAX/MIRALAX) 17 GM/SCOOP powder MIX 17GM (AS MARKED IN BOTTLE TOP) IN 8 OUNCES OF WATER, MIX AND DRINK ONCE A DAY AS DIRECTED. (Patient not taking: Reported on 09/30/2020)   No facility-administered medications prior to visit.    Allergies  Allergen Reactions  . Vitamin B12 Other (See Comments)    interacted with other medications     Patient Care Team: Birdie Sons, MD as PCP - General (Family Medicine) Marjie Skiff, MD as Consulting Physician (Psychiatry)  Review of Systems  Constitutional: Negative.  Negative for appetite change, chills and fever.  HENT: Negative.   Eyes: Negative.   Respiratory: Negative.  Negative for chest tightness, shortness of breath and wheezing.   Cardiovascular: Negative.  Negative for chest pain and palpitations.  Gastrointestinal: Positive for constipation. Negative for abdominal pain, nausea and vomiting.  Endocrine: Negative.   Genitourinary: Negative.   Musculoskeletal: Negative.   Skin: Negative.   Allergic/Immunologic: Negative.   Neurological: Negative.   Hematological: Negative.   Psychiatric/Behavioral: Negative.       Objective     Most recent functional status assessment: In your present state of health, do you have any difficulty performing the following activities: 09/30/2020  Hearing? N  Vision? N  Difficulty concentrating or making decisions? N  Walking or climbing stairs? N  Dressing or bathing? N  Doing errands, shopping? N  Some recent data might be hidden   Most recent fall risk assessment: Fall Risk  09/30/2020  Falls in the past year? 0  Number falls in past yr: 0  Injury with Fall? 0  Follow up Falls evaluation completed    Most recent depression screenings: PHQ 2/9 Scores 09/30/2020 05/13/2020  PHQ - 2 Score 0 0  PHQ- 9 Score 0 -   Most recent  cognitive screening: 6CIT Screen 09/30/2020  What Year? 0 points  What month? 0 points  What time? 0 points  Count back from 20 0 points  Months in reverse 0 points  Repeat phrase 10 points  Total Score 10   Most recent Audit-C alcohol use screening Alcohol Use Disorder Test (AUDIT) 09/30/2020  1. How often do you have a drink containing alcohol? 1  2. How many drinks containing alcohol do you have on a typical day when you are drinking? 0  3. How often do you have six or more drinks on one occasion? 1  AUDIT-C Score 2  Alcohol Brief Interventions/Follow-up AUDIT Score <7 follow-up not indicated   A score of 3 or more in women, and 4 or more in men indicates increased risk for alcohol abuse, EXCEPT if all of the points are from question 1   No results found for any visits on 09/30/20.  Assessment & Plan     Annual wellness visit done today including the all of the following: Reviewed patient's Family Medical History Reviewed and updated list of patient's medical providers Assessment of cognitive impairment was done Assessed patient's functional ability Established a written schedule for health screening [place] Completed and Reviewed  Exercise Activities and Dietary recommendations Goals   None     Immunization History  Administered Date(s) Administered  . Influenza, High Dose Seasonal PF 09/20/2016  . Pneumococcal Conjugate-13 05/16/2015  . Pneumococcal Polysaccharide-23 09/20/2016    Health Maintenance  Topic Date Due  . COVID-19 Vaccine (1) Never done  . TETANUS/TDAP  Never done  . COLONOSCOPY  12/01/2014  . INFLUENZA VACCINE  07/13/2020  . Hepatitis C Screening  Completed  . PNA vac Low Risk Adult  Completed     Discussed health benefits of physical activity, and encouraged him to engage in regular exercise appropriate for his age and condition.           The entirety of the information documented in the History of Present  Illness, Review of Systems and Physical Exam were personally obtained by me. Portions of this information were initially documented by the CMA and reviewed by me for thoroughness and accuracy.      Lelon Huh, MD  [facility] [phone] (phone) [phone] (fax)  [place]

## 2020-09-30 ENCOUNTER — Other Ambulatory Visit: Payer: Self-pay

## 2020-09-30 ENCOUNTER — Ambulatory Visit (INDEPENDENT_AMBULATORY_CARE_PROVIDER_SITE_OTHER): Payer: Medicare Other | Admitting: Family Medicine

## 2020-09-30 ENCOUNTER — Encounter: Payer: Self-pay | Admitting: Family Medicine

## 2020-09-30 VITALS — BP 166/105 | HR 66 | Temp 97.9°F | Resp 16 | Wt 251.0 lb

## 2020-09-30 DIAGNOSIS — E785 Hyperlipidemia, unspecified: Secondary | ICD-10-CM | POA: Diagnosis not present

## 2020-09-30 DIAGNOSIS — Z23 Encounter for immunization: Secondary | ICD-10-CM | POA: Diagnosis not present

## 2020-09-30 DIAGNOSIS — I951 Orthostatic hypotension: Secondary | ICD-10-CM

## 2020-09-30 DIAGNOSIS — F2089 Other schizophrenia: Secondary | ICD-10-CM

## 2020-09-30 DIAGNOSIS — Z125 Encounter for screening for malignant neoplasm of prostate: Secondary | ICD-10-CM

## 2020-09-30 DIAGNOSIS — Z0000 Encounter for general adult medical examination without abnormal findings: Secondary | ICD-10-CM | POA: Diagnosis not present

## 2020-09-30 DIAGNOSIS — N1831 Chronic kidney disease, stage 3a: Secondary | ICD-10-CM

## 2020-09-30 MED ORDER — MIDODRINE HCL 10 MG PO TABS: 5.0000 mg | ORAL_TABLET | Freq: Three times a day (TID) | ORAL | Status: DC

## 2020-09-30 NOTE — Patient Instructions (Signed)
.   Please review the attached list of medications and notify my office if there are any errors.   . Reduce dose of midodrine to 5 mg (1/2 tablet) three times every day

## 2020-09-30 NOTE — Progress Notes (Signed)
Complete physical exam   Patient: [name]   DOB: 1951/04/28   69 y.o. Male  MRN: [id number] Visit Date: 09/30/2020  Today's healthcare provider: Lelon Huh, MD    Subjective     HPI  He generally feels well today with no complains. He does have history of labile blood pressure on antihypertensives in the past but over the last few years has required midodrine and florinef for hyPOtension. He does not know his home blood pressures. Denies chest pains, heart flutters, or dyspnea.    Past Medical History:  Diagnosis Date  . Chicken pox   . Dizziness   . Measles   . Mumps   . Schizophrenia [facility])    Past Surgical History:  Procedure Laterality Date  . ABDOMINAL SURGERY    . COLON SURGERY    . LAPAROTOMY N/A 03/20/2016   Procedure: EXPLORATORY LAPAROTOMY with lysis of adhesions;  Surgeon: Jules Husbands, MD;  Location: [facility] ORS;  Service: General;  Laterality: N/A;  . None     Social History   Socioeconomic History  . Marital status: Divorced    Spouse name: Not on file  . Number of children: 2  . Years of education: Not on file  . Highest education level: High school graduate  Occupational History  . Occupation: Disabled  Tobacco Use  . Smoking status: Former Smoker    Types: Cigarettes    Quit date: 08/04/2004    Years since quitting: 16.1  . Smokeless tobacco: Former Systems developer    Quit date: 08/04/2004  Vaping Use  . Vaping Use: Never used  Substance and Sexual Activity  . Alcohol use: No    Alcohol/week: 0.0 standard drinks  . Drug use: No  . Sexual activity: Not Currently  Other Topics Concern  . Not on file  Social History Narrative  . Not on file   Social Determinants of Health   Financial Resource Strain:   . Difficulty of Paying Living Expenses: Not on file  Food Insecurity:   . Worried About Charity fundraiser in the Last Year: Not on file  . Ran Out of Food in the Last Year: Not on file  Transportation Needs:   . Lack of Transportation  (Medical): Not on file  . Lack of Transportation (Non-Medical): Not on file  Physical Activity:   . Days of Exercise per Week: Not on file  . Minutes of Exercise per Session: Not on file  Stress:   . Feeling of Stress : Not on file  Social Connections:   . Frequency of Communication with Friends and Family: Not on file  . Frequency of Social Gatherings with Friends and Family: Not on file  . Attends Religious Services: Not on file  . Active Member of Clubs or Organizations: Not on file  . Attends Archivist Meetings: Not on file  . Marital Status: Not on file  Intimate Partner Violence:   . Fear of Current or Ex-Partner: Not on file  . Emotionally Abused: Not on file  . Physically Abused: Not on file  . Sexually Abused: Not on file   Family Status  Relation Name Status  . Mother  Deceased at age 58       congestive heart failure  . Father  Deceased at age 55       lung cancer  . Sister  Alive  . Sister  Alive  . Son  Deceased  Boat accident   . MGM  Deceased  . MGF  Deceased  . PGM  Deceased  . PGF  Deceased   Family History  Problem Relation Age of Onset  . Diabetes Mother   . Heart disease Mother   . Stroke Mother   . Diabetes Father   . Hypertension Father   . Lung cancer Father   . Cancer Father        lung cancer  . Schizophrenia Sister   . Lung cancer Sister   . Colon cancer Sister    Allergies  Allergen Reactions  . Vitamin B12 Other (See Comments)    interacted with other medications     Patient Care Team: Birdie Sons, MD as PCP - General (Family Medicine) Marjie Skiff, MD as Consulting Physician (Psychiatry)   Medications: Outpatient Medications Prior to Visit  Medication Sig  . fludrocortisone (FLORINEF) 0.1 MG tablet Take 1 tablet (0.1 mg total) by mouth 2 (two) times daily. TAKE 1 TABLET BY MOUTH TWICE A DAY  . ketoconazole (NIZORAL) 200 MG tablet   . OLANZapine (ZYPREXA) 15 MG tablet Take 1 tablet (15 mg total) by  mouth at bedtime.  . OXcarbazepine (TRILEPTAL) 150 MG tablet TAKE 1 TABLET BY MOUTH 2 TIMES DAILY  . PARoxetine (PAXIL) 10 MG tablet Take 1 tablet (10 mg total) by mouth daily.  . polyethylene glycol (MIRALAX / GLYCOLAX) 17 g packet Take 17 g by mouth daily.  . pravastatin (PRAVACHOL) 40 MG tablet TAKE 1 TABLET BY MOUTH DAILY  . Psyllium (METAMUCIL) 0.36 g CAPS Take by mouth.  . risperiDONE (RISPERDAL) 2 MG tablet Take 1 tablet (2 mg total) by mouth at bedtime.  . terbinafine (LAMISIL) 1 % cream Apply 1 application topically 2 (two) times daily. For 7 days  . traMADol (ULTRAM) 50 MG tablet tramadol 50 mg tablet  Take 1 tablet every 6 hours by oral route as needed.  . [DISCONTINUED] midodrine (PROAMATINE) 10 MG tablet TAKE 1 TABLET BY MOUTH 3 TIMES A DAY WITH MEALS  . AMITIZA 24 MCG capsule TAKE ONE CAPSULE BY MOUTH TWICE A DAY WITH A MEAL. (Patient not taking: Reported on 09/30/2020)  . [DISCONTINUED] AMITIZA 24 MCG capsule   . [DISCONTINUED] cephALEXin (KEFLEX) 500 MG capsule  (Patient not taking: Reported on 09/30/2020)  . [DISCONTINUED] polyethylene glycol powder (GLYCOLAX/MIRALAX) 17 GM/SCOOP powder MIX 17GM (AS MARKED IN BOTTLE TOP) IN 8 OUNCES OF WATER, MIX AND DRINK ONCE A DAY AS DIRECTED. (Patient not taking: Reported on 09/30/2020)   No facility-administered medications prior to visit.    Review of Systems  Constitutional: Negative for chills, diaphoresis and fever.  HENT: Negative for congestion, ear discharge, ear pain, hearing loss, nosebleeds, sore throat and tinnitus.   Eyes: Negative for photophobia, pain, discharge and redness.  Respiratory: Negative for cough, shortness of breath, wheezing and stridor.   Cardiovascular: Negative for chest pain, palpitations and leg swelling.  Gastrointestinal: Negative for abdominal pain, blood in stool, constipation, diarrhea, nausea and vomiting.  Endocrine: Negative for polydipsia.  Genitourinary: Negative for dysuria, flank pain,  frequency, hematuria and urgency.  Musculoskeletal: Negative for back pain, myalgias and neck pain.  Skin: Negative for rash.  Allergic/Immunologic: Negative for environmental allergies.  Neurological: Negative for dizziness, tremors, seizures, weakness and headaches.  Hematological: Does not bruise/bleed easily.  Psychiatric/Behavioral: Negative for hallucinations and suicidal ideas. The patient is not nervous/anxious.       Objective    BP (!) 166/105 (BP Location: Left  Arm, Patient Position: Sitting)   Pulse 66   Temp 97.9 F (36.6 C) (Oral)   Resp 16   Wt 251 lb (113.9 kg)   BMI 34.04 kg/m    Physical Exam   General Appearance:    Obese male. Alert, cooperative, in no acute distress, appears stated age  Head:    Normocephalic, without obvious abnormality, atraumatic  Eyes:    PERRL, conjunctiva/corneas clear, EOM's intact, fundi    benign, both eyes       Ears:    Normal TM's and external ear canals, both ears  Nose:   Nares normal, septum midline, mucosa normal, no drainage   or sinus tenderness  Throat:   Lips, mucosa, and tongue normal; teeth and gums normal  Neck:   Supple, symmetrical, trachea midline, no adenopathy;       thyroid:  No enlargement/tenderness/nodules; no carotid   bruit or JVD  Back:     Symmetric, no curvature, ROM normal, no CVA tenderness  Lungs:     Clear to auscultation bilaterally, respirations unlabored  Chest wall:    No tenderness or deformity  Heart:    Normal heart rate. Normal rhythm. No murmurs, rubs, or gallops.  S1 and S2 normal  Abdomen:     Soft, non-tender, bowel sounds active all four quadrants,    Obese, no organomegaly  Genitalia:    deferred  Rectal:    deferred  Extremities:   All extremities are intact. No cyanosis or edema  Pulses:   2+ and symmetric all extremities  Skin:   Skin color, texture, turgor normal, no rashes or lesions  Lymph nodes:   Cervical, supraclavicular, and axillary nodes normal  Neurologic:    CNII-XII intact. Normal strength, sensation and reflexes      throughout      Assessment & Plan    Routine Health Maintenance and Physical Exam  Exercise Activities and Dietary recommendations Goals   None     Immunization History  Administered Date(s) Administered  . Influenza, High Dose Seasonal PF 09/20/2016  . Moderna SARS-COVID-2 Vaccination 06/08/2020, 07/06/2020  . Pneumococcal Conjugate-13 05/16/2015  . Pneumococcal Polysaccharide-23 09/20/2016    Health Maintenance  Topic Date Due  . TETANUS/TDAP  Never done  . COLONOSCOPY  12/01/2014  . INFLUENZA VACCINE  07/13/2020  . COVID-19 Vaccine  Completed  . Hepatitis C Screening  Completed  . PNA vac Low Risk Adult  Completed    Discussed health benefits of physical activity, and encouraged him to engage in regular exercise appropriate for his age and condition.  1. Orthostatic hypotension Reduce from 10mg  TID to - midodrine (PROAMATINE) 10 MG tablet; Take 0.5 tablets (5 mg total) by mouth 3 (three) times daily.  2. Hyperlipidemia, unspecified hyperlipidemia type He is tolerating pravastatin well with no adverse effects.   - CBC - Lipid panel - Comprehensive metabolic panel  3. Schizophrenia, simple, chronic (HCC) Doing well on current anti-psychotics managed by Dr. Shea Evans  4. Stage 3a chronic kidney disease (HCC) Check renal panel today.   6. Prostate cancer screening  - PSA  7. Need for influenza vaccination  - Flu Vaccine QUAD High Dose IM (Fluad)  Follow up BP check in 1 month.      The entirety of the information documented in the History of Present Illness, Review of Systems and Physical Exam were personally obtained by me. Portions of this information were initially documented by the CMA and reviewed by me for thoroughness and accuracy.  Lelon Huh, MD  [facility] [phone] (phone) [phone] (fax)  [place]

## 2020-10-07 DIAGNOSIS — E785 Hyperlipidemia, unspecified: Secondary | ICD-10-CM | POA: Diagnosis not present

## 2020-10-08 LAB — COMPREHENSIVE METABOLIC PANEL
ALT: 23 IU/L (ref 0–44)
AST: 24 IU/L (ref 0–40)
Albumin/Globulin Ratio: 1.8 (ref 1.2–2.2)
Albumin: 4.4 g/dL (ref 3.7–4.7)
Alkaline Phosphatase: 114 IU/L (ref 44–121)
BUN/Creatinine Ratio: 8 — ABNORMAL LOW (ref 10–24)
BUN: 9 mg/dL (ref 8–27)
Bilirubin Total: 0.3 mg/dL (ref 0.0–1.2)
CO2: 26 mmol/L (ref 20–29)
Calcium: 9.6 mg/dL (ref 8.6–10.2)
Chloride: 101 mmol/L (ref 96–106)
Creatinine, Ser: 1.17 mg/dL (ref 0.76–1.27)
GFR calc Af Amer: 72 mL/min/{1.73_m2} (ref >59)
GFR calc non Af Amer: 62 mL/min/{1.73_m2} (ref >59)
Globulin, Total: 2.5 g/dL (ref 1.5–4.5)
Glucose: 102 mg/dL — ABNORMAL HIGH (ref 65–99)
Potassium: 4.1 mmol/L (ref 3.5–5.2)
Sodium: 142 mmol/L (ref 134–144)
Total Protein: 6.9 g/dL (ref 6.0–8.5)

## 2020-10-08 LAB — PSA: Prostate Specific Ag, Serum: 0.8 ng/mL (ref 0.0–4.0)

## 2020-10-08 LAB — CBC
Hematocrit: 41.2 % (ref 37.5–51.0)
Hemoglobin: 13.7 g/dL (ref 13.0–17.7)
MCH: 29.3 pg (ref 26.6–33.0)
MCHC: 33.3 g/dL (ref 31.5–35.7)
MCV: 88 fL (ref 79–97)
Platelets: 289 10*3/uL (ref 150–450)
RBC: 4.67 x10E6/uL (ref 4.14–5.80)
RDW: 12.9 % (ref 11.6–15.4)
WBC: 8.2 10*3/uL (ref 3.4–10.8)

## 2020-10-08 LAB — LIPID PANEL
Chol/HDL Ratio: 4.3 ratio (ref 0.0–5.0)
Cholesterol, Total: 165 mg/dL (ref 100–199)
HDL: 38 mg/dL — ABNORMAL LOW (ref >39)
LDL Chol Calc (NIH): 105 mg/dL — ABNORMAL HIGH (ref 0–99)
Triglycerides: 124 mg/dL (ref 0–149)
VLDL Cholesterol Cal: 22 mg/dL (ref 5–40)

## 2020-10-16 ENCOUNTER — Other Ambulatory Visit: Payer: Self-pay | Admitting: Family Medicine

## 2020-10-16 DIAGNOSIS — K5900 Constipation, unspecified: Secondary | ICD-10-CM

## 2020-10-22 ENCOUNTER — Telehealth (INDEPENDENT_AMBULATORY_CARE_PROVIDER_SITE_OTHER): Payer: Medicare Other | Admitting: Psychiatry

## 2020-10-22 ENCOUNTER — Encounter: Payer: Self-pay | Admitting: Psychiatry

## 2020-10-22 ENCOUNTER — Other Ambulatory Visit: Payer: Self-pay

## 2020-10-22 ENCOUNTER — Telehealth: Payer: Self-pay

## 2020-10-22 DIAGNOSIS — G2401 Drug induced subacute dyskinesia: Secondary | ICD-10-CM

## 2020-10-22 DIAGNOSIS — F09 Unspecified mental disorder due to known physiological condition: Secondary | ICD-10-CM

## 2020-10-22 DIAGNOSIS — F203 Undifferentiated schizophrenia: Secondary | ICD-10-CM | POA: Diagnosis not present

## 2020-10-22 DIAGNOSIS — F411 Generalized anxiety disorder: Secondary | ICD-10-CM

## 2020-10-22 DIAGNOSIS — Z9189 Other specified personal risk factors, not elsewhere classified: Secondary | ICD-10-CM

## 2020-10-22 DIAGNOSIS — Z79899 Other long term (current) drug therapy: Secondary | ICD-10-CM

## 2020-10-22 MED ORDER — PAROXETINE HCL 10 MG PO TABS: 15.0000 mg | ORAL_TABLET | Freq: Every day | ORAL | 0 refills | Status: DC

## 2020-10-22 NOTE — Telephone Encounter (Signed)
mailed labwork orders and ekg orders labwork is prolactin, [facility]  dx: [phone]

## 2020-10-22 NOTE — Progress Notes (Signed)
Virtual Visit via Telephone Note  I connected with [name] on 10/22/20 at 11:40 AM EST by telephone and verified that I am speaking with the correct person using two identifiers.  Location Provider Location : [facility] Patient Location : Home  Participants: Patient , Sister ,Provider    I discussed the limitations, risks, security and privacy concerns of performing an evaluation and management service by telephone and the availability of in person appointments. I also discussed with the patient that there may be a patient responsible charge related to this service. The patient expressed understanding and agreed to proceed.   I discussed the assessment and treatment plan with the patient. The patient was provided an opportunity to ask questions and all were answered. The patient agreed with the plan and demonstrated an understanding of the instructions.   The patient was advised to call back or seek an in-person evaluation if the symptoms worsen or if the condition fails to improve as anticipated.   [place] MD OP Progress Note  10/22/2020 1:00 PM [name]  MRN:  [id number]  Chief Complaint:  Chief Complaint    Follow-up     HPI: [name] is a 69 year old Caucasian male, divorced, lives in [place], has a history of schizophrenia, tardive dyskinesia was evaluated by phone today.  Patient being a limited historian collateral information was obtained from [facility].  Patient today reports he does have episodes when he is anxious.  He could not elaborate much.  He otherwise reports he is doing fine.  He is compliant on medications.  He denies suicidality, homicidality or perceptual disturbances.  He denies side effects to medications.  According to sister patient does have episodes of anxiety due to situational stressors.  She reports he has not had any behavioral problems like disinhibition, sexual gestures since the last appointment.  She believes he is doing well with  that.  She has not observed any significant memory changes.  She hence reports she did not follow-up with neurology.  Patient had recent visit with his primary care provider and had labs done.  However discussed with sister that EKG was not repeated.  It is important that he does that due to his history of prolonged QT syndrome.  Patient denies any other concerns today.    Visit Diagnosis:    ICD-10-CM   1. Undifferentiated schizophrenia ([place])  F20.3   2. Tardive dyskinesia  G24.01   3. GAD (generalized anxiety disorder)  F41.1 PARoxetine (PAXIL) 10 MG tablet  4. Cognitive disorder  F09   5. High risk medication use  Z79.899 Prolactin    Hemoglobin A1C  6. At risk for long QT syndrome  Z91.89 EKG 12-Lead    Past Psychiatric History: I have reviewed past psychiatric history from my progress note on 02/23/2018.  Past trials of Haldol, Klonopin, Seroquel  Past Medical History:  Past Medical History:  Diagnosis Date  . Chicken pox   . Dizziness   . Measles   . Mumps   . Schizophrenia [facility])     Past Surgical History:  Procedure Laterality Date  . ABDOMINAL SURGERY    . COLON SURGERY    . LAPAROTOMY N/A 03/20/2016   Procedure: EXPLORATORY LAPAROTOMY with lysis of adhesions;  Surgeon: Jules Husbands, MD;  Location: [facility] ORS;  Service: General;  Laterality: N/A;  . None      Family Psychiatric History: I have reviewed family psychiatric history from my progress note on 02/23/2018  Family History:  Family History  Problem Relation Age of Onset  . Diabetes Mother   . Heart disease Mother   . Stroke Mother   . Diabetes Father   . Hypertension Father   . Lung cancer Father   . Cancer Father        lung cancer  . Schizophrenia Sister   . Lung cancer Sister   . Colon cancer Sister     Social History: Reviewed social history from my progress note on 02/23/2018 Social History   Socioeconomic History  . Marital status: Divorced    Spouse name: Not on file  . Number of  children: 2  . Years of education: Not on file  . Highest education level: High school graduate  Occupational History  . Occupation: Disabled  Tobacco Use  . Smoking status: Former Smoker    Types: Cigarettes    Quit date: 08/04/2004    Years since quitting: 16.2  . Smokeless tobacco: Former Systems developer    Quit date: 08/04/2004  Vaping Use  . Vaping Use: Never used  Substance and Sexual Activity  . Alcohol use: No    Alcohol/week: 0.0 standard drinks  . Drug use: No  . Sexual activity: Not Currently  Other Topics Concern  . Not on file  Social History Narrative  . Not on file   Social Determinants of Health   Financial Resource Strain:   . Difficulty of Paying Living Expenses: Not on file  Food Insecurity:   . Worried About Charity fundraiser in the Last Year: Not on file  . Ran Out of Food in the Last Year: Not on file  Transportation Needs:   . Lack of Transportation (Medical): Not on file  . Lack of Transportation (Non-Medical): Not on file  Physical Activity:   . Days of Exercise per Week: Not on file  . Minutes of Exercise per Session: Not on file  Stress:   . Feeling of Stress : Not on file  Social Connections:   . Frequency of Communication with Friends and Family: Not on file  . Frequency of Social Gatherings with Friends and Family: Not on file  . Attends Religious Services: Not on file  . Active Member of Clubs or Organizations: Not on file  . Attends Archivist Meetings: Not on file  . Marital Status: Not on file    Allergies:  Allergies  Allergen Reactions  . Vitamin B12 Other (See Comments)    interacted with other medications     Metabolic Disorder Labs: No results found for: HGBA1C, MPG No results found for: PROLACTIN Lab Results  Component Value Date   CHOL 165 10/07/2020   TRIG 124 10/07/2020   HDL 38 (L) 10/07/2020   CHOLHDL 4.3 10/07/2020   LDLCALC 105 (H) 10/07/2020   LDLCALC 91 06/14/2019   Lab Results  Component Value Date    TSH 2.420 06/14/2019   TSH 1.333 05/04/2016    Therapeutic Level Labs: No results found for: LITHIUM No results found for: VALPROATE No components found for:  CBMZ  Current Medications: Current Outpatient Medications  Medication Sig Dispense Refill  . cyclobenzaprine (FLEXERIL) 10 MG tablet Take by mouth.    . fludrocortisone (FLORINEF) 0.1 MG tablet Take 1 tablet (0.1 mg total) by mouth 2 (two) times daily. TAKE 1 TABLET BY MOUTH TWICE A DAY 180 tablet 3  . ketoconazole (NIZORAL) 200 MG tablet     . lubiprostone (AMITIZA) 24 MCG capsule TAKE ONE CAPSULE BY MOUTH TWICE  A DAY WITH A MEAL. 60 capsule 0  . midodrine (PROAMATINE) 10 MG tablet Take 0.5 tablets (5 mg total) by mouth 3 (three) times daily.    Marland Kitchen OLANZapine (ZYPREXA) 15 MG tablet Take 1 tablet (15 mg total) by mouth at bedtime. 90 tablet 0  . OXcarbazepine (TRILEPTAL) 150 MG tablet TAKE 1 TABLET BY MOUTH 2 TIMES DAILY 180 tablet 3  . PARoxetine (PAXIL) 10 MG tablet Take 1.5 tablets (15 mg total) by mouth daily. 135 tablet 0  . polyethylene glycol (MIRALAX / GLYCOLAX) 17 g packet Take 17 g by mouth daily.    . pravastatin (PRAVACHOL) 40 MG tablet TAKE 1 TABLET BY MOUTH DAILY 30 tablet 12  . Psyllium (METAMUCIL) 0.36 g CAPS Take by mouth.    . risperiDONE (RISPERDAL) 2 MG tablet Take 1 tablet (2 mg total) by mouth at bedtime. 90 tablet 0  . terbinafine (LAMISIL) 1 % cream Apply 1 application topically 2 (two) times daily. For 7 days 42 g 0  . traMADol (ULTRAM) 50 MG tablet tramadol 50 mg tablet  Take 1 tablet every 6 hours by oral route as needed.     No current facility-administered medications for this visit.     Musculoskeletal: Strength & Muscle Tone: UTA Gait & Station: UTA Patient leans: N/A  Psychiatric Specialty Exam: Review of Systems  Unable to perform ROS: Psychiatric disorder    There were no vitals taken for this visit.There is no height or weight on file to calculate BMI.  General Appearance: UTA  Eye  Contact:  UTA  Speech:  Normal Rate  Volume:  Normal  Mood:  Anxious  Affect:  UTA  Thought Process:  Goal Directed and Descriptions of Associations: Intact  Orientation:  Full (Time, Place, and Person)  Thought Content: Logical   Suicidal Thoughts:  No  Homicidal Thoughts:  No  Memory:  Immediate;   Fair Recent;   Limited Remote;   limited  Judgement:  Impaired  Insight:  Shallow  Psychomotor Activity:  UTA  Concentration:  Concentration: Fair and Attention Span: Fair  Recall:  [employer] of Knowledge: Fair  Language: Fair  Akathisia:  No  Handed:  Right  AIMS (if indicated): UTA  Assets:  Communication Skills Desire for Improvement Social Support  ADL's:  Intact  Cognition: Impaired,  Mild  Sleep:  Fair   Screenings: AIMS     Office Visit from 02/23/2018 in [place] Total Score 3    PHQ2-9     Office Visit from 09/30/2020 in [place] Visit from 05/13/2020 in [place] Visit from 02/16/2017 in [place]  PHQ-2 Total Score 0 0 0  PHQ-9 Total Score 0 -- 0       Assessment and Plan: [name] is a 69 year old Caucasian male, divorced, lives in [place], on [place], has a history of schizophrenia, GAD was evaluated by phone today.  Patient is currently struggling with anxiety.  Plan as noted below.  Plan Schizophrenia-stable Zyprexa 15 mg p.o. nightly Risperidone 2 mg p.o. nightly Trileptal 150 mg p.o. twice daily Patient is on 2 antipsychotic medications and it was discussed with patient as well as sister the long-term risk of being on multiple antipsychotics. The plan is to taper him off to monotherapy however previous trial did not go too well.  GAD-unstable Increase Paxil to 15 mg p.o. daily  GAD-chronic Patient declined medication  Rule out neurocognitive disorder-patient was referred  for neurology however has been noncompliant.  Encouraged the  same.  At risk for prolonged QT syndrome-advised to get an EKG.  However has been noncompliant.  Encouraged to get it done.  High risk medication use-will order prolactin, hemoglobin A1c. I have reviewed most recent lipid panel dated 10/26 2021.  Collateral information obtained from sister as noted above.  Follow-up in clinic in 6 weeks or sooner if needed.  I have spent atleast 20 minutes non face to face with patient today. More than 50 % of the time was spent for preparing to see the patient ( e.g., review of test, records ), ordering medications and test ,psychoeducation and supportive psychotherapy and care coordination,as well as documenting clinical information in electronic health record. This note was generated in part or whole with voice recognition software. Voice recognition is usually quite accurate but there are transcription errors that can and very often do occur. I apologize for any typographical errors that were not detected and corrected.       Ursula Alert, MD 10/22/2020, 1:00 PM

## 2020-10-31 ENCOUNTER — Ambulatory Visit: Payer: Self-pay | Admitting: Family Medicine

## 2020-11-07 ENCOUNTER — Other Ambulatory Visit
Admission: RE | Admit: 2020-11-07 | Discharge: 2020-11-07 | Disposition: A | Discharge status: Home / Self Care | Payer: Medicare Other | Attending: Psychiatry | Admitting: Psychiatry

## 2020-11-07 DIAGNOSIS — Z79899 Other long term (current) drug therapy: Secondary | ICD-10-CM | POA: Insufficient documentation

## 2020-11-07 DIAGNOSIS — I4581 Long QT syndrome: Secondary | ICD-10-CM | POA: Diagnosis not present

## 2020-11-07 LAB — HEMOGLOBIN A1C
Hgb A1c MFr Bld: 5.3 % (ref 4.8–5.6)
Mean Plasma Glucose: 105.41 mg/dL

## 2020-11-08 LAB — PROLACTIN: Prolactin: 17.3 ng/mL — ABNORMAL HIGH (ref 4.0–15.2)

## 2020-11-19 ENCOUNTER — Other Ambulatory Visit: Payer: Self-pay | Admitting: Family Medicine

## 2020-11-19 DIAGNOSIS — K5900 Constipation, unspecified: Secondary | ICD-10-CM

## 2020-11-20 ENCOUNTER — Other Ambulatory Visit: Payer: Self-pay | Admitting: Family Medicine

## 2020-11-20 DIAGNOSIS — K5900 Constipation, unspecified: Secondary | ICD-10-CM

## 2020-11-21 DIAGNOSIS — E538 Deficiency of other specified B group vitamins: Secondary | ICD-10-CM | POA: Diagnosis not present

## 2020-11-21 DIAGNOSIS — E519 Thiamine deficiency, unspecified: Secondary | ICD-10-CM | POA: Diagnosis not present

## 2020-11-21 DIAGNOSIS — G3184 Mild cognitive impairment, so stated: Secondary | ICD-10-CM | POA: Diagnosis not present

## 2020-11-24 ENCOUNTER — Telehealth (INDEPENDENT_AMBULATORY_CARE_PROVIDER_SITE_OTHER): Payer: Medicare Other | Admitting: Psychiatry

## 2020-11-24 ENCOUNTER — Other Ambulatory Visit (HOSPITAL_COMMUNITY): Payer: Self-pay | Admitting: Neurology

## 2020-11-24 ENCOUNTER — Other Ambulatory Visit: Payer: Self-pay | Admitting: Neurology

## 2020-11-24 ENCOUNTER — Encounter: Payer: Self-pay | Admitting: Psychiatry

## 2020-11-24 ENCOUNTER — Other Ambulatory Visit: Payer: Self-pay

## 2020-11-24 DIAGNOSIS — G2401 Drug induced subacute dyskinesia: Secondary | ICD-10-CM | POA: Diagnosis not present

## 2020-11-24 DIAGNOSIS — F203 Undifferentiated schizophrenia: Secondary | ICD-10-CM

## 2020-11-24 DIAGNOSIS — F411 Generalized anxiety disorder: Secondary | ICD-10-CM | POA: Diagnosis not present

## 2020-11-24 DIAGNOSIS — F09 Unspecified mental disorder due to known physiological condition: Secondary | ICD-10-CM

## 2020-11-24 DIAGNOSIS — Z79899 Other long term (current) drug therapy: Secondary | ICD-10-CM | POA: Diagnosis not present

## 2020-11-24 DIAGNOSIS — G3184 Mild cognitive impairment, so stated: Secondary | ICD-10-CM

## 2020-11-24 MED ORDER — RISPERIDONE 2 MG PO TABS: 2.0000 mg | ORAL_TABLET | Freq: Every day | ORAL | 0 refills | Status: DC

## 2020-11-24 MED ORDER — OLANZAPINE 15 MG PO TABS: 15.0000 mg | ORAL_TABLET | Freq: Every day | ORAL | 0 refills | Status: DC

## 2020-11-24 NOTE — Progress Notes (Signed)
**Bradley Bradley** Virtual Visit via Telephone Bradley  I connected with Bradley Bradley on 11/24/20 at  2:40 PM EST by telephone and verified that I am speaking with the correct person using two identifiers.   I discussed the limitations, risks, security and privacy concerns of performing an evaluation and management service by telephone and the availability of in person appointments. I also discussed with the patient that there may be a patient responsible charge related to this service. The patient expressed understanding and agreed to proceed.  Location Provider Location : [facility] Patient Location : Home  Participants: Patient , SisterStanton Bradley, Provider    I discussed the assessment and treatment plan with the patient. The patient was provided an opportunity to ask questions and all were answered. The patient agreed with the plan and demonstrated an understanding of the instructions.   The patient was advised to call back or seek an in-person evaluation if the symptoms worsen or if the condition fails to improve as anticipated. Bradley Bradley  11/24/2020 3:25 PM Bradley Bradley  MRN:  [id number]  Chief Complaint:  Chief Complaint    Follow-up     HPI: Bradley Bradley is a 69 year old Caucasian male, divorced, lives in [place], has a history of schizophrenia, tardive dyskinesia was evaluated by telemedicine today.  Patient being a limited historian collateral information was obtained from sister-Bradley Bradley.  Patient today reports he is currently doing well.  Denies any significant anxiety or depressive symptoms.  He is compliant on medications as prescribed.  He denies any side effects to medications.  According to sister patient is currently stable on current medication regimen.  Denies any concerns.  He had his neurology appointment and needs to get his MRI brain.  He is currently taking Bradley Bradley.  Reviewed and discussed recent labs as well as EKG with patient as well as sister.  Denies any  other concerns today.  Visit Diagnosis:    ICD-10-CM   1. Undifferentiated schizophrenia ([place])  F20.3 Bradley Bradley (Bradley Bradley) 15 MG tablet  2. Tardive dyskinesia  G24.01   3. GAD (generalized anxiety disorder)  F41.1 Bradley Bradley (RISPERDAL) 2 MG tablet  4. High risk medication use  Z79.899   5. Cognitive disorder  F09     Past Psychiatric History: I have reviewed past psychiatric history my progress Bradley on 02/23/2018.  Past trials of Haldol, Klonopin, Seroquel  Past Medical History:  Past Medical History:  Diagnosis Date  . Chicken pox   . Dizziness   . Measles   . Mumps   . Schizophrenia [facility])     Past Surgical History:  Procedure Laterality Date  . ABDOMINAL SURGERY    . COLON SURGERY    . LAPAROTOMY N/A 03/20/2016   Procedure: EXPLORATORY LAPAROTOMY with lysis of adhesions;  Surgeon: Jules Husbands, MD;  Location: [facility] ORS;  Service: General;  Laterality: N/A;  . None      Family Psychiatric History: I have reviewed family psychiatric history from my progress Bradley on 02/23/2018  Family History:  Family History  Problem Relation Age of Onset  . Diabetes Mother   . Heart disease Mother   . Stroke Mother   . Diabetes Father   . Hypertension Father   . Lung cancer Father   . Cancer Father        lung cancer  . Schizophrenia Sister   . Lung cancer Sister   . Colon cancer Sister     Social History: I have reviewed social history  from my progress Bradley on 02/23/2018 Social History   Socioeconomic History  . Marital status: Divorced    Spouse name: Not on file  . Number of children: 2  . Years of education: Not on file  . Highest education level: High school graduate  Occupational History  . Occupation: Disabled  Tobacco Use  . Smoking status: Former Smoker    Types: Cigarettes    Quit date: 08/04/2004    Years since quitting: 16.3  . Smokeless tobacco: Former Systems developer    Quit date: 08/04/2004  Vaping Use  . Vaping Use: Never used  Substance and Sexual Activity  .  Alcohol use: No    Alcohol/week: 0.0 standard drinks  . Drug use: No  . Sexual activity: Not Currently  Other Topics Concern  . Not on file  Social History Narrative  . Not on file   Social Determinants of Health   Financial Resource Strain: Not on file  Food Insecurity: Not on file  Transportation Needs: Not on file  Physical Activity: Not on file  Stress: Not on file  Social Connections: Not on file    Allergies:  Allergies  Allergen Reactions  . Vitamin B12 Other (See Comments)    interacted with other medications     Metabolic Disorder Labs: Lab Results  Component Value Date   HGBA1C 5.3 11/07/2020   MPG 105.41 11/07/2020   Lab Results  Component Value Date   PROLACTIN 17.3 (H) 11/07/2020   Lab Results  Component Value Date   CHOL 165 10/07/2020   TRIG 124 10/07/2020   HDL 38 (L) 10/07/2020   CHOLHDL 4.3 10/07/2020   LDLCALC 105 (H) 10/07/2020   LDLCALC 91 06/14/2019   Lab Results  Component Value Date   TSH 2.420 06/14/2019   TSH 1.333 05/04/2016    Therapeutic Level Labs: No results found for: LITHIUM No results found for: VALPROATE No components found for:  CBMZ  Current Medications: Current Outpatient Medications  Medication Sig Dispense Refill  . Bradley Bradley (Bradley Bradley) 5 MG tablet Take by mouth.    . cyclobenzaprine (FLEXERIL) 10 MG tablet Take by mouth.    . fludrocortisone (FLORINEF) 0.1 MG tablet Take 1 tablet (0.1 mg total) by mouth 2 (two) times daily. TAKE 1 TABLET BY MOUTH TWICE A DAY 180 tablet 3  . ketoconazole (NIZORAL) 200 MG tablet     . lubiprostone (AMITIZA) 24 MCG capsule TAKE ONE CAPSULE BY MOUTH TWICE A DAY WITH A MEAL. 60 capsule 0  . midodrine (PROAMATINE) 10 MG tablet Take 0.5 tablets (5 mg total) by mouth 3 (three) times daily.    Marland Kitchen Bradley Bradley (Bradley Bradley) 15 MG tablet Take 1 tablet (15 mg total) by mouth at bedtime. 90 tablet 0  . Bradley Bradley (Bradley Bradley) 150 MG tablet TAKE 1 TABLET BY MOUTH 2 TIMES DAILY 180 tablet 3  .  Bradley Bradley (Bradley Bradley) 10 MG tablet Take 1.5 tablets (15 mg total) by mouth daily. 135 tablet 0  . polyethylene glycol (MIRALAX / GLYCOLAX) 17 g packet Take 17 g by mouth daily.    . pravastatin (PRAVACHOL) 40 MG tablet TAKE 1 TABLET BY MOUTH DAILY 30 tablet 12  . Psyllium (METAMUCIL) 0.36 g CAPS Take by mouth.    . Bradley Bradley (RISPERDAL) 2 MG tablet Take 1 tablet (2 mg total) by mouth at bedtime. 90 tablet 0  . terbinafine (LAMISIL) 1 % cream Apply 1 application topically 2 (two) times daily. For 7 days 42 g 0  . traMADol (ULTRAM) 50 MG  tablet tramadol 50 mg tablet  Take 1 tablet every 6 hours by oral route as needed.     No current facility-administered medications for this visit.     Musculoskeletal: Strength & Muscle Tone: UTA Gait & Station: UTA Patient leans: N/A  Psychiatric Specialty Exam: Review of Systems  Psychiatric/Behavioral: Negative for agitation, behavioral problems, confusion, decreased concentration, dysphoric mood, hallucinations, self-injury, sleep disturbance and suicidal ideas. The patient is not nervous/anxious and is not hyperactive.   All other systems reviewed and are negative.   There were no vitals taken for this visit.There is no height or weight on file to calculate BMI.  General Appearance: UTA  Eye Contact:  UTA  Speech:  Clear and Coherent  Volume:  Normal  Mood:  Euthymic  Affect:  UTA  Thought Process:  Goal Directed and Descriptions of Associations: Intact  Orientation:  Other:  Person,place, situation  Thought Content: Logical   Suicidal Thoughts:  No  Homicidal Thoughts:  No  Memory:  Immediate;   Fair Recent;   limited Remote;   limited  Judgement:  Impaired  Insight:  Shallow  Psychomotor Activity:  UTA  Concentration:  Concentration: Fair and Attention Span: Fair  Recall:  [employer] of Knowledge: Poor  Language: Fair  Akathisia:  No  Handed:  Right  AIMS (if indicated): UTA  Assets:  Chief Executive Officer Social Support   ADL's:  Intact  Cognition: Impaired,  Mild  Sleep:  Fair   Screenings: [place] Office Visit from 02/23/2018 in [place] Total Score 3    PHQ2-9   [place] Office Visit from 09/30/2020 in [place] Visit from 05/13/2020 in [place] Visit from 02/16/2017 in [place]  PHQ-2 Total Score 0 0 0  PHQ-9 Total Score 0 -- 0       Assessment and Plan: HANAN MOEN is a 69 year old Caucasian male, divorced, lives in [place], [place], has a history of schizophrenia, GAD was evaluated by phone today.  Patient is currently stable on current medication regimen.  Patient with cognitive disorder will continue to follow-up with neurology.  Plan Schizophrenia-stable Bradley Bradley 15 mg p.o. nightly Bradley Bradley 2 mg p.o. nightly Bradley Bradley 150 mg p.o. twice daily Patient is on 2 antipsychotic medications and it was discussed with patient as well as sister the long-term risk of being on multiple antipsychotics.  Long-term plan is to taper him to monotherapy however previous trial did not go too well.  GAD-improving Bradley Bradley 15 mg p.o. daily  TD-chronic He declines medications  Cognitive disorder-patient is currently under the care of neurology , reviewed notes dated 11/21/2020-Dr. Manuella Bradley-' mild cognitive disorder in a patient with schizophrenia.  Will order MRI of brain without contrast with neuro quant sequence.  We will also order TSH, vitamin B12, vitamin B1, folate, syphilis.  Will begin Bradley Bradley 5 mg once a day in the morning.'  High risk medication use-I have reviewed the following labs-prolactin-dated 11/07/2020-17.3-slightly elevated, hemoglobin A1c-within normal limits, TSH-within normal limits, vitamin B12-381, as well as EKG-normal sinus rhythm.  Reviewed and discussed with patient as well as sister the prolactin slightly elevated and vitamin B12 is borderline low.  Will not make  any medication readjustment currently for the prolactin increase.  Patient to follow-up with primary care provider for low vitamin B12-will benefit from replacement.  Collateral information was obtained from sister as noted above.  Follow-up in clinic in 2 to 3  months or sooner if needed.  I have spent atleast 20 minutes non face to face  with patient today. More than 50 % of the time was spent for preparing to see the patient ( e.g., review of test, records ), obtaining and to review and separately obtained history , ordering medications and test ,psychoeducation and supportive psychotherapy and care coordination,as well as documenting clinical information in electronic health record,interpreting and communication of test results This Bradley was generated in part or whole with voice recognition software. Voice recognition is usually quite accurate but there are transcription errors that can and very often do occur. I apologize for any typographical errors that were not detected and corrected.       Bradley Alert, MD 11/24/2020, 3:25 PM

## 2020-12-04 ENCOUNTER — Other Ambulatory Visit: Payer: Self-pay

## 2020-12-04 ENCOUNTER — Ambulatory Visit (HOSPITAL_COMMUNITY)
Admission: RE | Admit: 2020-12-04 | Discharge: 2020-12-04 | Disposition: A | Discharge status: Home / Self Care | Payer: Medicare Other | Source: Ambulatory Visit | Attending: Neurology | Admitting: Neurology

## 2020-12-04 DIAGNOSIS — G3184 Mild cognitive impairment, so stated: Secondary | ICD-10-CM | POA: Diagnosis not present

## 2020-12-04 DIAGNOSIS — I6389 Other cerebral infarction: Secondary | ICD-10-CM | POA: Diagnosis not present

## 2020-12-04 DIAGNOSIS — I614 Nontraumatic intracerebral hemorrhage in cerebellum: Secondary | ICD-10-CM | POA: Diagnosis not present

## 2020-12-04 DIAGNOSIS — I6782 Cerebral ischemia: Secondary | ICD-10-CM | POA: Diagnosis not present

## 2020-12-17 ENCOUNTER — Other Ambulatory Visit: Payer: Self-pay

## 2020-12-17 DIAGNOSIS — K5900 Constipation, unspecified: Secondary | ICD-10-CM

## 2020-12-17 NOTE — Telephone Encounter (Signed)
Please review. Thanks!  

## 2020-12-17 NOTE — Telephone Encounter (Signed)
[facility] faxed refill request for the following medications:  lubiprostone (AMITIZA) 24 MCG capsule  Please advise.

## 2020-12-18 MED ORDER — LUBIPROSTONE 24 MCG PO CAPS: 24.0000 ug | ORAL_CAPSULE | Freq: Two times a day (BID) | ORAL | 12 refills | Status: DC

## 2021-01-21 ENCOUNTER — Other Ambulatory Visit: Payer: Self-pay | Admitting: Family Medicine

## 2021-01-21 NOTE — Telephone Encounter (Signed)
Requested medication (s) are due for refill today: yes  Requested medication (s) are on the active medication list: yes  Last refill: 10/16/2020  Future visit scheduled: no  Notes to clinic:  this refill cannot be delegated    Requested Prescriptions  Pending Prescriptions Disp Refills   fludrocortisone (FLORINEF) 0.1 MG tablet [Pharmacy Med Name: FLUDROCORTISONE ACETATE 0.1 MG TAB] 180 tablet 3    Sig: TAKE 1 TABLET BY MOUTH TWICE A DAY      Not Delegated - Endocrinology: Oral Corticosteroids - fludrocortisone Failed - 01/21/2021 12:22 PM      Failed - This refill cannot be delegated      Failed - Last BP in normal range    BP Readings from Last 1 Encounters:  09/30/20 (!) 166/105          Passed - K in normal range and within 180 days    Potassium  Date Value Ref Range Status  10/07/2020 4.1 3.5 - 5.2 mmol/L Final          Passed - Na in normal range and within 180 days    Sodium  Date Value Ref Range Status  10/07/2020 142 134 - 144 mmol/L Final          Passed - Valid encounter within last 6 months    Recent Outpatient Visits           3 months ago Annual physical exam   [facility] Birdie Sons, MD   8 months ago Tinea versicolor   [facility] Birdie Sons, MD   1 year ago High risk medication use   [facility] Birdie Sons, MD   2 years ago Cellulitis of finger of right hand   [facility] Birdie Sons, MD   3 years ago Rt flank pain   [facility] Birdie Sons, MD

## 2021-02-23 ENCOUNTER — Encounter: Payer: Self-pay | Admitting: Psychiatry

## 2021-02-23 ENCOUNTER — Telehealth (INDEPENDENT_AMBULATORY_CARE_PROVIDER_SITE_OTHER): Payer: Medicare Other | Admitting: Psychiatry

## 2021-02-23 ENCOUNTER — Other Ambulatory Visit: Payer: Self-pay

## 2021-02-23 DIAGNOSIS — G2401 Drug induced subacute dyskinesia: Secondary | ICD-10-CM

## 2021-02-23 DIAGNOSIS — Z79899 Other long term (current) drug therapy: Secondary | ICD-10-CM

## 2021-02-23 DIAGNOSIS — F411 Generalized anxiety disorder: Secondary | ICD-10-CM | POA: Diagnosis not present

## 2021-02-23 DIAGNOSIS — F09 Unspecified mental disorder due to known physiological condition: Secondary | ICD-10-CM | POA: Diagnosis not present

## 2021-02-23 DIAGNOSIS — F209 Schizophrenia, unspecified: Secondary | ICD-10-CM | POA: Diagnosis not present

## 2021-02-23 DIAGNOSIS — F203 Undifferentiated schizophrenia: Secondary | ICD-10-CM

## 2021-02-23 MED ORDER — PAROXETINE HCL 10 MG PO TABS: 15.0000 mg | ORAL_TABLET | Freq: Every day | ORAL | 0 refills | Status: DC

## 2021-02-23 MED ORDER — OLANZAPINE 15 MG PO TABS: 15.0000 mg | ORAL_TABLET | Freq: Every day | ORAL | 0 refills | Status: DC

## 2021-02-23 MED ORDER — RISPERIDONE 2 MG PO TABS: 2.0000 mg | ORAL_TABLET | Freq: Every day | ORAL | 0 refills | Status: DC

## 2021-02-23 NOTE — Progress Notes (Signed)
Virtual Visit via Telephone Note  I connected with [name] on 02/23/21 at  2:00 PM EDT by telephone and verified that I am speaking with the correct person using two identifiers.  Location Provider Location : [facility] Patient Location : Home  Participants: Patient , Sister,Provider   I discussed the limitations, risks, security and privacy concerns of performing an evaluation and management service by telephone and the availability of in person appointments. I also discussed with the patient that there may be a patient responsible charge related to this service. The patient expressed understanding and agreed to proceed.   I discussed the assessment and treatment plan with the patient. The patient was provided an opportunity to ask questions and all were answered. The patient agreed with the plan and demonstrated an understanding of the instructions.   The patient was advised to call back or seek an in-person evaluation if the symptoms worsen or if the condition fails to improve as anticipated.  [place] MD OP Progress Note  02/23/2021 5:37 PM [name]  MRN:  [id number]  Chief Complaint:  Chief Complaint    Follow-up; Depression     HPI: [name] is a 70 year old Caucasian male, divorced, lives in [place], has a history of schizophrenia, tardive dyskinesia was evaluated by telemedicine today.  Patient being a limited historian ,sisterStanton Kidney, provided collateral information.  Patient today appeared to be cooperative, pleasant.  He was able to answer questions appropriately although in short phrases.  Patient reports his mood as good.  He does reports aches and pains on his arm as well as neck.  Patient reports he has been struggling with aches since the past few weeks.  According to sister it slightly due to him not using the right pillow.  She however reports she is going to talk to his primary care provider.  Sister reports patient has been taking Paxil 10 mg only  however agrees to go up on the dose as instructed last visit.  According to sister patient otherwise is doing overall okay.  Patient does not have any side effects to his medications.  He did have neurology appointment for his memory changes and impulsivity.  Patient had an MRI of his brain done.  Denies any other concerns today.  Visit Diagnosis:    ICD-10-CM   1. Schizophrenia, chronic condition (HCC)  F20.9 OLANZapine (ZYPREXA) 15 MG tablet  2. Tardive dyskinesia  G24.01   3. GAD (generalized anxiety disorder)  F41.1 risperiDONE (RISPERDAL) 2 MG tablet    PARoxetine (PAXIL) 10 MG tablet  4. Cognitive disorder  F09     Past Psychiatric History: I have reviewed past psychiatric history from my progress note on 02/23/2018.  Past trials of Haldol, Klonopin, Seroquel  Past Medical History:  Past Medical History:  Diagnosis Date   Chicken pox    Dizziness    Measles    Mumps    Schizophrenia ([place])     Past Surgical History:  Procedure Laterality Date   ABDOMINAL SURGERY     COLON SURGERY     LAPAROTOMY N/A 03/20/2016   Procedure: EXPLORATORY LAPAROTOMY with lysis of adhesions;  Surgeon: Jules Husbands, MD;  Location: [facility] ORS;  Service: General;  Laterality: N/A;   None      Family Psychiatric History: I have reviewed family psychiatric history from my progress note on 02/23/2018  Family History:  Family History  Problem Relation Age of Onset   Diabetes Mother    Heart disease  Mother    Stroke Mother    Diabetes Father    Hypertension Father    Lung cancer Father    Cancer Father        lung cancer   Schizophrenia Sister    Lung cancer Sister    Colon cancer Sister     Social History: Reviewed social history from my progress note on 02/23/2018 Social History   Socioeconomic History   Marital status: Divorced    Spouse name: Not on file   Number of children: 2   Years of education: Not on file   Highest education level: High school  graduate  Occupational History   Occupation: Disabled  Tobacco Use   Smoking status: Former Smoker    Types: Cigarettes    Quit date: 08/04/2004    Years since quitting: 16.5   Smokeless tobacco: Former Systems developer    Quit date: 08/04/2004  Vaping Use   Vaping Use: Never used  Substance and Sexual Activity   Alcohol use: No    Alcohol/week: 0.0 standard drinks   Drug use: No   Sexual activity: Not Currently  Other Topics Concern   Not on file  Social History Narrative   Not on file   Social Determinants of Health   Financial Resource Strain: Not on file  Food Insecurity: Not on file  Transportation Needs: Not on file  Physical Activity: Not on file  Stress: Not on file  Social Connections: Not on file    Allergies:  Allergies  Allergen Reactions   Vitamin B12 Other (See Comments)    interacted with other medications     Metabolic Disorder Labs: Lab Results  Component Value Date   HGBA1C 5.3 11/07/2020   MPG 105.41 11/07/2020   Lab Results  Component Value Date   PROLACTIN 17.3 (H) 11/07/2020   Lab Results  Component Value Date   CHOL 165 10/07/2020   TRIG 124 10/07/2020   HDL 38 (L) 10/07/2020   CHOLHDL 4.3 10/07/2020   LDLCALC 105 (H) 10/07/2020   LDLCALC 91 06/14/2019   Lab Results  Component Value Date   TSH 2.420 06/14/2019   TSH 1.333 05/04/2016    Therapeutic Level Labs: No results found for: LITHIUM No results found for: VALPROATE No components found for:  CBMZ  Current Medications: Current Outpatient Medications  Medication Sig Dispense Refill   cyclobenzaprine (FLEXERIL) 10 MG tablet Take by mouth.     donepezil (ARICEPT) 5 MG tablet Take by mouth.     fludrocortisone (FLORINEF) 0.1 MG tablet Take 1 tablet (100 mcg total) by mouth 2 (two) times daily. 180 tablet 0   ketoconazole (NIZORAL) 200 MG tablet      lubiprostone (AMITIZA) 24 MCG capsule Take 1 capsule (24 mcg total) by mouth 2 (two) times daily with a meal. 60 capsule  12   midodrine (PROAMATINE) 10 MG tablet Take 0.5 tablets (5 mg total) by mouth 3 (three) times daily.     OLANZapine (ZYPREXA) 15 MG tablet Take 1 tablet (15 mg total) by mouth at bedtime. 90 tablet 0   OXcarbazepine (TRILEPTAL) 150 MG tablet TAKE 1 TABLET BY MOUTH 2 TIMES DAILY 180 tablet 3   PARoxetine (PAXIL) 10 MG tablet Take 1.5 tablets (15 mg total) by mouth daily. 135 tablet 0   polyethylene glycol (MIRALAX / GLYCOLAX) 17 g packet Take 17 g by mouth daily.     pravastatin (PRAVACHOL) 40 MG tablet TAKE 1 TABLET BY MOUTH DAILY 30 tablet 12  Psyllium (METAMUCIL) 0.36 g CAPS Take by mouth.     risperiDONE (RISPERDAL) 2 MG tablet Take 1 tablet (2 mg total) by mouth at bedtime. 90 tablet 0   terbinafine (LAMISIL) 1 % cream Apply 1 application topically 2 (two) times daily. For 7 days 42 g 0   traMADol (ULTRAM) 50 MG tablet tramadol 50 mg tablet  Take 1 tablet every 6 hours by oral route as needed.     No current facility-administered medications for this visit.     Musculoskeletal: Strength & Muscle Tone: UTA Gait & Station: UTA Patient leans: N/A  Psychiatric Specialty Exam: Review of Systems  Musculoskeletal: Positive for arthralgias and neck pain.  Psychiatric/Behavioral: Negative for agitation, behavioral problems, confusion, decreased concentration, dysphoric mood, hallucinations, self-injury, sleep disturbance and suicidal ideas. The patient is not nervous/anxious and is not hyperactive.   All other systems reviewed and are negative.   There were no vitals taken for this visit.There is no height or weight on file to calculate BMI.  General Appearance: UTA  Eye Contact:  UTA  Speech:  Clear and Coherent  Volume:  Normal  Mood:  Anxious  Affect:  UTA  Thought Process:  Goal Directed and Descriptions of Associations: Intact  Orientation:  Full (Time, Place, and Person)  Thought Content: Logical   Suicidal Thoughts:  No  Homicidal Thoughts:  No  Memory:   Immediate;   Fair Recent;   Fair Remote;   Limited  Judgement:  Fair  Insight:  Shallow  Psychomotor Activity:  UTA  Concentration:  Concentration: Fair and Attention Span: Fair  Recall:  [employer] of Knowledge: Fair  Language: Fair  Akathisia:  No  Handed:  Right  AIMS (if indicated): not done  Assets:  Communication Skills Desire for Improvement Housing Social Support  ADL's:  Intact  Cognition: Baseline  Sleep:  Fair   Screenings: [place] Office Visit from 02/23/2018 in [place] Total Score 3    PHQ2-9   [place] Visit from 09/30/2020 in [place] Visit from 05/13/2020 in [place] Visit from 02/16/2017 in [place]  PHQ-2 Total Score 0 0 0  PHQ-9 Total Score 0 -- 0       Assessment and Plan: [name] is a 70 year old Caucasian male, divorced, lives in [place], [place], has a history of schizophrenia, GAD was evaluated by telemedicine today.  Patient is currently stable on current medication regimen.  Plan as noted below.  Plan Schizophrenia-stable Zyprexa 15 mg p.o. nightly Risperidone 2 mg p.o. nightly Trileptal 150 mg p.o. twice daily Patient on 2 antipsychotic medications.  Previous attempt to taper him off to monotherapy was not successful.  We will continue to monitor.  GAD-improving Paxil 15 mg p.o. daily.  Patient has been taking only 10 mg and agrees to increase it to 15 mg p.o. daily.  TD-chronic Declines medications  Cognitive disorder-patient to continue to follow-up with Dr. Shah-neurology as needed.  I have reviewed notes dated 11/21/2020 as well as follow-up appointment dated 12/08/2020-MRI brain showed mild to moderate atrophy, small old stroke in the left cerebellum.  Patient on statin, may consider aspirin 81 mg by mouth once a day.'  Patient to follow-up with primary care provider for neck pain.  Collateral  information obtained from sister as noted above.  Follow-up in clinic in person in 2 months from now   I have spent at least  25 minutes non face to face with patient today which includes the time spent for preparing to see the patient  (e.g., review of test, records), ordering medications and test, psychoeducation and supportive psychotherapy, care coordination as well as documenting clinical information in electronic health record.     This note was generated in part or whole with voice recognition software. Voice recognition is usually quite accurate but there are transcription errors that can and very often do occur. I apologize for any typographical errors that were not detected and corrected.      Ursula Alert, MD 02/24/2021, 9:44 AM

## 2021-03-15 DIAGNOSIS — S61012A Laceration without foreign body of left thumb without damage to nail, initial encounter: Secondary | ICD-10-CM | POA: Diagnosis not present

## 2021-03-25 DIAGNOSIS — Z4802 Encounter for removal of sutures: Secondary | ICD-10-CM | POA: Diagnosis not present

## 2021-04-20 ENCOUNTER — Other Ambulatory Visit: Payer: Self-pay | Admitting: Family Medicine

## 2021-04-20 NOTE — Telephone Encounter (Signed)
Please advise patient needs o.v. scheduled before refill can approved.

## 2021-04-20 NOTE — Telephone Encounter (Signed)
Requested medication (s) are due for refill today:   Provider to review  Requested medication (s) are on the active medication list:   Yes  Future visit scheduled:   No   Last ordered: 01/21/2021 #180, 0 refills  Non delegated refill   Requested Prescriptions  Pending Prescriptions Disp Refills   fludrocortisone (FLORINEF) 0.1 MG tablet [Pharmacy Med Name: FLUDROCORTISONE ACETATE 0.1 MG TAB] 180 tablet 0    Sig: TAKE 1 TABLET BY MOUTH TWICE A DAY      Not Delegated - Endocrinology: Oral Corticosteroids - fludrocortisone Failed - 04/20/2021  2:09 PM      Failed - This refill cannot be delegated      Failed - K in normal range and within 180 days    Potassium  Date Value Ref Range Status  10/07/2020 4.1 3.5 - 5.2 mmol/L Final          Failed - Na in normal range and within 180 days    Sodium  Date Value Ref Range Status  10/07/2020 142 134 - 144 mmol/L Final          Failed - Last BP in normal range    BP Readings from Last 1 Encounters:  09/30/20 (!) 166/105          Failed - Valid encounter within last 6 months    Recent Outpatient Visits           6 months ago Annual physical exam   [facility] Birdie Sons, MD   11 months ago Tinea versicolor   [facility] Birdie Sons, MD   1 year ago High risk medication use   [facility] Birdie Sons, MD   2 years ago Cellulitis of finger of right hand   [facility] Birdie Sons, MD   3 years ago Rt flank pain   [facility] Birdie Sons, MD

## 2021-04-21 ENCOUNTER — Ambulatory Visit (INDEPENDENT_AMBULATORY_CARE_PROVIDER_SITE_OTHER): Payer: Medicare Other | Admitting: Psychiatry

## 2021-04-21 ENCOUNTER — Other Ambulatory Visit: Payer: Self-pay

## 2021-04-21 ENCOUNTER — Encounter: Payer: Self-pay | Admitting: Psychiatry

## 2021-04-21 VITALS — BP 145/79 | Temp 98.2°F | Ht 72.0 in | Wt 252.0 lb

## 2021-04-21 DIAGNOSIS — G2401 Drug induced subacute dyskinesia: Secondary | ICD-10-CM | POA: Diagnosis not present

## 2021-04-21 DIAGNOSIS — F411 Generalized anxiety disorder: Secondary | ICD-10-CM

## 2021-04-21 DIAGNOSIS — Z79899 Other long term (current) drug therapy: Secondary | ICD-10-CM | POA: Diagnosis not present

## 2021-04-21 DIAGNOSIS — F209 Schizophrenia, unspecified: Secondary | ICD-10-CM

## 2021-04-21 DIAGNOSIS — Z9189 Other specified personal risk factors, not elsewhere classified: Secondary | ICD-10-CM

## 2021-04-21 MED ORDER — RISPERIDONE 2 MG PO TABS: 2.0000 mg | ORAL_TABLET | Freq: Every day | ORAL | 0 refills | Status: DC

## 2021-04-21 MED ORDER — OLANZAPINE 15 MG PO TABS: 15.0000 mg | ORAL_TABLET | Freq: Every day | ORAL | 0 refills | Status: DC

## 2021-04-21 MED ORDER — PAROXETINE HCL 10 MG PO TABS: 15.0000 mg | ORAL_TABLET | Freq: Every day | ORAL | 0 refills | Status: DC

## 2021-04-21 NOTE — Progress Notes (Signed)
[place] MD OP Progress Note  04/21/2021 10:05 AM [name]  MRN:  [id number]  Chief Complaint:  Chief Complaint    Follow-up; Anxiety     HPI: [name] is a 70 year old Caucasian male, divorced, lives in [place], has a history of schizophrenia, tardive dyskinesia was evaluated in office today.  Patient being a limited historian, sister-Mary provided collateral information.  Patient today reports he has been having frequent falls.  He bruised his left forearm, hurt his hand and needed sutures.  Patient does not know what could have caused the falls however sister seems to think it could be because the hardwood floor at home was wet and slippery.  Patient did have appointment with his primary care provider for evaluation after the fall.  Patient continues to be compliant on medications.  Denies any side effects.  He denies any significant anxiety or mood lability.  Sister confirms the same.  Patient reports sleep is good.  Patient denies any hallucinations or other perceptual disturbances.  He denies any suicidality or homicidality.  Patient denies any other concerns today.  Visit Diagnosis:    ICD-10-CM   1. Schizophrenia, chronic condition (HCC)  F20.9 OLANZapine (ZYPREXA) 15 MG tablet  2. Tardive dyskinesia  G24.01   3. GAD (generalized anxiety disorder)  F41.1 risperiDONE (RISPERDAL) 2 MG tablet    PARoxetine (PAXIL) 10 MG tablet  4. High risk medication use  Z79.899 10-Hydroxycarbazepine    CBC With Diff/Platelet    Comprehensive metabolic panel  5. At risk for prolonged QT interval syndrome  Z91.89 EKG 12-Lead    Past Psychiatric History: I have reviewed past psychiatric history from progress note on 02/23/2018.  Past trials of Haldol, Klonopin, Seroquel  Past Medical History:  Past Medical History:  Diagnosis Date  . Chicken pox   . Dizziness   . Measles   . Mumps   . Schizophrenia [facility] [place] [facility])     Past Surgical History:  Procedure Laterality Date  .  ABDOMINAL SURGERY    . COLON SURGERY    . LAPAROTOMY N/A 03/20/2016   Procedure: EXPLORATORY LAPAROTOMY with lysis of adhesions;  Surgeon: Jules Husbands, MD;  Location: [facility] ORS;  Service: General;  Laterality: N/A;  . None      Family Psychiatric History: I have reviewed family psychiatric history from progress note on 02/23/2018  Family History:  Family History  Problem Relation Age of Onset  . Diabetes Mother   . Heart disease Mother   . Stroke Mother   . Diabetes Father   . Hypertension Father   . Lung cancer Father   . Cancer Father        lung cancer  . Schizophrenia Sister   . Lung cancer Sister   . Colon cancer Sister     Social History: Reviewed social history from progress note on 02/23/2018 Social History   Socioeconomic History  . Marital status: Divorced    Spouse name: Not on file  . Number of children: 2  . Years of education: Not on file  . Highest education level: High school graduate  Occupational History  . Occupation: Disabled  Tobacco Use  . Smoking status: Former Smoker    Types: Cigarettes    Quit date: 08/04/2004    Years since quitting: 16.7  . Smokeless tobacco: Former Systems developer    Quit date: 08/04/2004  Vaping Use  . Vaping Use: Never used  Substance and Sexual Activity  . Alcohol use: No    Alcohol/week:  0.0 standard drinks  . Drug use: No  . Sexual activity: Not Currently  Other Topics Concern  . Not on file  Social History Narrative  . Not on file   Social Determinants of Health   Financial Resource Strain: Not on file  Food Insecurity: Not on file  Transportation Needs: Not on file  Physical Activity: Not on file  Stress: Not on file  Social Connections: Not on file    Allergies:  Allergies  Allergen Reactions  . Vitamin B12 Other (See Comments)    interacted with other medications  interacted with other medications     Metabolic Disorder Labs: Lab Results  Component Value Date   HGBA1C 5.3 11/07/2020   MPG 105.41  11/07/2020   Lab Results  Component Value Date   PROLACTIN 17.3 (H) 11/07/2020   Lab Results  Component Value Date   CHOL 165 10/07/2020   TRIG 124 10/07/2020   HDL 38 (L) 10/07/2020   CHOLHDL 4.3 10/07/2020   LDLCALC 105 (H) 10/07/2020   LDLCALC 91 06/14/2019   Lab Results  Component Value Date   TSH 2.420 06/14/2019   TSH 1.333 05/04/2016    Therapeutic Level Labs: No results found for: LITHIUM No results found for: VALPROATE No components found for:  CBMZ  Current Medications: Current Outpatient Medications  Medication Sig Dispense Refill  . cephALEXin (KEFLEX) 500 MG capsule Take 500 mg by mouth 4 (four) times daily.    . cyclobenzaprine (FLEXERIL) 10 MG tablet Take by mouth.    . donepezil (ARICEPT) 5 MG tablet Take by mouth.    . fludrocortisone (FLORINEF) 0.1 MG tablet Take 1 tablet (100 mcg total) by mouth 2 (two) times daily. 180 tablet 0  . ketoconazole (NIZORAL) 200 MG tablet     . lubiprostone (AMITIZA) 24 MCG capsule Take 1 capsule (24 mcg total) by mouth 2 (two) times daily with a meal. 60 capsule 12  . midodrine (PROAMATINE) 10 MG tablet Take 0.5 tablets (5 mg total) by mouth 3 (three) times daily.    Marland Kitchen OLANZapine (ZYPREXA) 15 MG tablet Take 1 tablet (15 mg total) by mouth at bedtime. 90 tablet 0  . OXcarbazepine (TRILEPTAL) 150 MG tablet TAKE 1 TABLET BY MOUTH 2 TIMES DAILY 180 tablet 3  . PARoxetine (PAXIL) 10 MG tablet Take 1.5 tablets (15 mg total) by mouth daily. 135 tablet 0  . polyethylene glycol (MIRALAX / GLYCOLAX) 17 g packet Take 17 g by mouth daily.    . pravastatin (PRAVACHOL) 40 MG tablet TAKE 1 TABLET BY MOUTH DAILY 30 tablet 12  . Psyllium (METAMUCIL) 0.36 g CAPS Take by mouth.    . risperiDONE (RISPERDAL) 2 MG tablet Take 1 tablet (2 mg total) by mouth at bedtime. 90 tablet 0  . terbinafine (LAMISIL) 1 % cream Apply 1 application topically 2 (two) times daily. For 7 days 42 g 0  . traMADol (ULTRAM) 50 MG tablet tramadol 50 mg tablet  Take 1  tablet every 6 hours by oral route as needed.     No current facility-administered medications for this visit.     Musculoskeletal: Strength & Muscle Tone: UTA Gait & Station: normal Patient leans: N/A  Psychiatric Specialty Exam: Review of Systems  Skin:       Bruising-  left fore arm-  Psychiatric/Behavioral: Negative.  Negative for behavioral problems, decreased concentration, dysphoric mood, self-injury, sleep disturbance and suicidal ideas. The patient is not hyperactive.   All other systems reviewed and are negative.  Blood pressure (!) 145/79, temperature 98.2 F (36.8 C), height 6' (1.829 m), weight 252 lb (114.3 kg), SpO2 96 %.Body mass index is 34.18 kg/m.  General Appearance: Casual  Eye Contact:  Good  Speech:  Clear and Coherent  Volume:  Normal  Mood:  Euthymic  Affect:  Congruent  Thought Process:  Goal Directed and Descriptions of Associations: Intact  Orientation:  Full (Time, Place, and Person)  Thought Content: Logical   Suicidal Thoughts:  No  Homicidal Thoughts:  No  Memory:  Immediate;   Fair Recent;   Fair Remote;   Baseline  Judgement:  Fair  Insight:  Fair  Psychomotor Activity:  Normal  Concentration:  Concentration: Fair and Attention Span: Fair  Recall:  [employer] of Knowledge: Fair  Language: Fair  Akathisia:  No  Handed:  Right  AIMS (if indicated): done  Assets:  Communication Skills Desire for Improvement Housing  ADL's:  Intact  Cognition: baseline  Sleep:  Fair   Screenings: [place] Office Visit from 02/23/2018 in [place] Total Score 3    GAD-7   [place] Office Visit from 04/21/2021 in [place]  Total GAD-7 Score 1    PHQ2-9   [place] Visit from 04/21/2021 in [place] Visit from 09/30/2020 in [place] Visit from 05/13/2020 in [place]  Visit from 02/16/2017 in [place]  PHQ-2 Total Score 0 0 0 0  PHQ-9 Total Score 5 0 -- 0    [place] Office Visit from 04/21/2021 in [place] No Risk       Assessment and Plan: KHALEED HOLAN is a 70 year old Caucasian male, divorced, lives in [place], [place], has a history of schizophrenia, GAD was evaluated in office today.  Patient is currently stable.  Plan as noted below.  Plan Schizophrenia-stable Zyprexa 15 mg p.o. nightly Risperidone 2 mg p.o. nightly Trileptal 150 mg p.o. twice daily Patient is currently on 2 antipsychotics.  Previous attempt to taper him off to monotherapy was not successful.  GAD-stable Paxil 15 mg p.o. daily  TD- chronic He declines medications.  High risk medication use-we will order Trileptal level, CBC, CMP.  At risk for prolonged QT syndrome- patient will benefit from repeat EKG since he does have a history of QT prolongation.  This was discussed with sister as well as patient.  Collateral information obtained from sister as noted above.  Follow-up in clinic in 2 months or sooner if needed.  This note was generated in part or whole with voice recognition software. Voice recognition is usually quite accurate but there are transcription errors that can and very often do occur. I apologize for any typographical errors that were not detected and corrected.      Ursula Alert, MD 04/22/2021, 8:32 PM

## 2021-05-06 ENCOUNTER — Ambulatory Visit (INDEPENDENT_AMBULATORY_CARE_PROVIDER_SITE_OTHER): Payer: Medicare Other | Admitting: Family Medicine

## 2021-05-06 ENCOUNTER — Encounter: Payer: Self-pay | Admitting: Family Medicine

## 2021-05-06 ENCOUNTER — Ambulatory Visit
Admission: RE | Admit: 2021-05-06 | Discharge: 2021-05-06 | Disposition: A | Discharge status: Home / Self Care | Payer: Medicare Other | Source: Ambulatory Visit | Attending: Family Medicine | Admitting: Family Medicine

## 2021-05-06 ENCOUNTER — Other Ambulatory Visit: Payer: Self-pay

## 2021-05-06 ENCOUNTER — Ambulatory Visit
Admission: RE | Admit: 2021-05-06 | Discharge: 2021-05-06 | Disposition: A | Discharge status: Home / Self Care | Payer: Medicare Other | Attending: Family Medicine | Admitting: Family Medicine

## 2021-05-06 VITALS — BP 161/88 | HR 60 | Temp 97.5°F | Resp 18 | Wt 250.0 lb

## 2021-05-06 DIAGNOSIS — M542 Cervicalgia: Secondary | ICD-10-CM | POA: Diagnosis not present

## 2021-05-06 DIAGNOSIS — I951 Orthostatic hypotension: Secondary | ICD-10-CM | POA: Diagnosis not present

## 2021-05-06 DIAGNOSIS — Z79899 Other long term (current) drug therapy: Secondary | ICD-10-CM | POA: Diagnosis not present

## 2021-05-06 DIAGNOSIS — M25512 Pain in left shoulder: Secondary | ICD-10-CM

## 2021-05-06 MED ORDER — MIDODRINE HCL 2.5 MG PO TABS: 2.5000 mg | ORAL_TABLET | Freq: Three times a day (TID) | ORAL | 3 refills | Status: DC

## 2021-05-06 NOTE — Progress Notes (Signed)
Established patient visit   Patient: [name]   DOB: [birth year]   70 y.o. Male  MRN: [id number] Visit Date: 05/06/2021  Today's healthcare provider: Lelon Huh, MD   Chief Complaint  Patient presents with  . Follow-up   Subjective    HPI  Follow up for orthostatic hypotension:   The patient was last seen for this 7 months ago. Changes made at last visit include reducing midodrine from 59m three times daily to 544m(1/2 x 1024mhree times daily.   He reports good compliance with treatment. Home blood pressure reading have been as high as 170/ 90. He feels that condition is Unchanged. He is not having side effects.   ----------------------------------------------------------------------------------------- He is also followed by Dr. EapShea Evansescribing his psychotropic medication and needs to have CBC, met C, trileptal levels and EKG done.   He also complains of chronic left shoulder pain and right neck pain. His sister reports that he has had several falls on to his left shoulder over the last few year.      Medications: Outpatient Medications Prior to Visit  Medication Sig  . cyclobenzaprine (FLEXERIL) 10 MG tablet Take by mouth.  . donepezil (ARICEPT) 5 MG tablet Take by mouth.  . fludrocortisone (FLORINEF) 0.1 MG tablet Take 1 tablet (100 mcg total) by mouth 2 (two) times daily.  . kMarland Kitchentoconazole (NIZORAL) 200 MG tablet   . lubiprostone (AMITIZA) 24 MCG capsule Take 1 capsule (24 mcg total) by mouth 2 (two) times daily with a meal.  . midodrine (PROAMATINE) 10 MG tablet Take 0.5 tablets (5 mg total) by mouth 3 (three) times daily.  . OMarland KitchenANZapine (ZYPREXA) 15 MG tablet Take 1 tablet (15 mg total) by mouth at bedtime.  . OXcarbazepine (TRILEPTAL) 150 MG tablet TAKE 1 TABLET BY MOUTH 2 TIMES DAILY  . PARoxetine (PAXIL) 10 MG tablet Take 1.5 tablets (15 mg total) by mouth daily.  . polyethylene glycol (MIRALAX / GLYCOLAX) 17 g packet Take 17 g by mouth daily.  .  pravastatin (PRAVACHOL) 40 MG tablet TAKE 1 TABLET BY MOUTH DAILY  . Psyllium (METAMUCIL) 0.36 g CAPS Take by mouth.  . risperiDONE (RISPERDAL) 2 MG tablet Take 1 tablet (2 mg total) by mouth at bedtime.  . terbinafine (LAMISIL) 1 % cream Apply 1 application topically 2 (two) times daily. For 7 days  . traMADol (ULTRAM) 50 MG tablet tramadol 50 mg tablet  Take 1 tablet every 6 hours by oral route as needed.  . [DISCONTINUED] cephALEXin (KEFLEX) 500 MG capsule Take 500 mg by mouth 4 (four) times daily. (Patient not taking: Reported on 05/06/2021)   No facility-administered medications prior to visit.    Review of Systems  Constitutional: Negative for appetite change, chills and fever.  Respiratory: Negative for chest tightness, shortness of breath and wheezing.   Cardiovascular: Negative for chest pain and palpitations.  Gastrointestinal: Negative for abdominal pain, nausea and vomiting.  Musculoskeletal: Positive for arthralgias (left shoulder) and neck pain (right posterior).       Objective    BP (!) 161/88 (BP Location: Left Arm, Patient Position: Sitting, Cuff Size: Large)   Pulse 60   Temp (!) 97.5 F (36.4 C) (Temporal)   Resp 18   Wt 250 lb (113.4 kg)   BMI 33.91 kg/m     Physical Exam   General: Appearance:    Mildly obese male in no acute distress  Eyes:    PERRL, conjunctiva/corneas clear, EOM's intact  Lungs:     Clear to auscultation bilaterally, respirations unlabored  Heart:    Normal heart rate. Normal rhythm. No murmurs, rubs, or gallops.   MS:   All extremities are intact.   Neurologic:   Awake, alert, oriented x 3. No apparent focal neurological           defect.       EKG: no cute changes.   Assessment & Plan     1. High risk medication use  - 10-Hydroxycarbazepine - CBC With Diff/Platelet - Comprehensive Metabolic Panel (CMET) - EKG 12-Lead  2. Orthostatic hypotension Is now mildly hypertensive. Will reduce 68m TID- midodrine (PROAMATINE)  2.5 MG tablet; Take 1 tablet (2.5 mg total) by mouth 3 (three) times daily.  Dispense: 90 tablet; Refill: 3  3. Neck pain  - DG Cervical Spine Complete; Future  4. Acute pain of left shoulder  - DG Shoulder Left; Future           DLelon Huh MD  [phone](phone) [phone](fax)  [place]

## 2021-05-08 ENCOUNTER — Other Ambulatory Visit: Payer: Self-pay | Admitting: Family Medicine

## 2021-05-08 MED ORDER — NAPROXEN 500 MG PO TABS: 500.0000 mg | ORAL_TABLET | Freq: Two times a day (BID) | ORAL | 2 refills | Status: DC

## 2021-05-10 LAB — CBC WITH DIFF/PLATELET
Basophils Absolute: 0.1 10*3/uL (ref 0.0–0.2)
Basos: 1 %
EOS (ABSOLUTE): 0.2 10*3/uL (ref 0.0–0.4)
Eos: 2 %
Hematocrit: 43 % (ref 37.5–51.0)
Hemoglobin: 14.5 g/dL (ref 13.0–17.7)
Immature Grans (Abs): 0 10*3/uL (ref 0.0–0.1)
Immature Granulocytes: 0 %
Lymphocytes Absolute: 2 10*3/uL (ref 0.7–3.1)
Lymphs: 20 %
MCH: 28.9 pg (ref 26.6–33.0)
MCHC: 33.7 g/dL (ref 31.5–35.7)
MCV: 86 fL (ref 79–97)
Monocytes Absolute: 1 10*3/uL — ABNORMAL HIGH (ref 0.1–0.9)
Monocytes: 10 %
Neutrophils Absolute: 6.7 10*3/uL (ref 1.4–7.0)
Neutrophils: 67 %
Platelets: 280 10*3/uL (ref 150–450)
RBC: 5.02 x10E6/uL (ref 4.14–5.80)
RDW: 13.6 % (ref 11.6–15.4)
WBC: 9.9 10*3/uL (ref 3.4–10.8)

## 2021-05-10 LAB — COMPREHENSIVE METABOLIC PANEL
ALT: 26 IU/L (ref 0–44)
AST: 24 IU/L (ref 0–40)
Albumin/Globulin Ratio: 1.8 (ref 1.2–2.2)
Albumin: 4.8 g/dL — ABNORMAL HIGH (ref 3.7–4.7)
Alkaline Phosphatase: 126 IU/L — ABNORMAL HIGH (ref 44–121)
BUN/Creatinine Ratio: 14 (ref 10–24)
BUN: 19 mg/dL (ref 8–27)
Bilirubin Total: 0.4 mg/dL (ref 0.0–1.2)
CO2: 23 mmol/L (ref 20–29)
Calcium: 9.9 mg/dL (ref 8.6–10.2)
Chloride: 94 mmol/L — ABNORMAL LOW (ref 96–106)
Creatinine, Ser: 1.37 mg/dL — ABNORMAL HIGH (ref 0.76–1.27)
Globulin, Total: 2.6 g/dL (ref 1.5–4.5)
Glucose: 89 mg/dL (ref 65–99)
Potassium: 4.8 mmol/L (ref 3.5–5.2)
Sodium: 133 mmol/L — ABNORMAL LOW (ref 134–144)
Total Protein: 7.4 g/dL (ref 6.0–8.5)
eGFR: 55 mL/min/{1.73_m2} — ABNORMAL LOW (ref >59)

## 2021-05-10 LAB — 10-HYDROXYCARBAZEPINE: Oxcarbazepine SerPl-Mcnc: 6 ug/mL — ABNORMAL LOW (ref 10–35)

## 2021-05-22 ENCOUNTER — Telehealth: Payer: Self-pay

## 2021-05-22 NOTE — Telephone Encounter (Signed)
Pt.'s sister, [name], calling to get lab results. Please clarify results. She received a letter to call for results.

## 2021-05-22 NOTE — Telephone Encounter (Signed)
I called patient's sister [name] back and advised her of Dr. Maralyn Sago message on patients x ray report from 05/06/2021.    Birdie Sons, MD  05/08/2021  6:52 AM EDT      Xray of shoulder is normal. Some arthritis in neck, otherwise normal. Can try naproxen 500mg  twice a day as needed for shoulder or neck pain. Have [facility]cription to [facility]. [name] states that patient is no longer having any neck or shoulder pain. She states they are going to hold off on taking medication at this time.

## 2021-06-30 ENCOUNTER — Ambulatory Visit: Payer: Self-pay | Admitting: Family Medicine

## 2021-06-30 ENCOUNTER — Telehealth: Payer: Self-pay

## 2021-06-30 NOTE — Telephone Encounter (Signed)
Copied from [place] [id number]. Topic: Appointment Scheduling - Scheduling Inquiry for Clinic >> Jun 30, 2021 11:09 AM Bayard Beaver wrote: Reason for XJD:BZMCEYE'M sister called in to schedule AWV. Please call back

## 2021-06-30 NOTE — Progress Notes (Deleted)
      Established patient visit   Patient: [name]   DOB: [birth year]   70 y.o. Male  MRN: [id number] Visit Date: 06/30/2021  Today's healthcare provider: Lelon Huh, MD   No chief complaint on file.  Subjective    HPI  Follow up for orthostatic hypotension:  The patient was last seen for this on 05/06/2021. . Changes made at last visit include reducing Midodrine from 5mg  TID- to midodrine (PROAMATINE) 2.5 MG tablet; Take 1 tablet (2.5 mg total) by mouth 3 (three) times daily.  He reports {excellent/good/fair/poor:19665} compliance with treatment. He feels that condition is {improved/worse/unchanged:3041574}. He {is/is not:21021397} having side effects. ***  -----------------------------------------------------------------------------------------   {Show patient history (optional):23778}   Medications: Outpatient Medications Prior to Visit  Medication Sig   cyclobenzaprine (FLEXERIL) 10 MG tablet Take by mouth.   donepezil (ARICEPT) 5 MG tablet Take by mouth.   fludrocortisone (FLORINEF) 0.1 MG tablet Take 1 tablet (100 mcg total) by mouth 2 (two) times daily.   ketoconazole (NIZORAL) 200 MG tablet    lubiprostone (AMITIZA) 24 MCG capsule Take 1 capsule (24 mcg total) by mouth 2 (two) times daily with a meal.   midodrine (PROAMATINE) 2.5 MG tablet Take 1 tablet (2.5 mg total) by mouth 3 (three) times daily.   naproxen (NAPROSYN) 500 MG tablet Take 1 tablet (500 mg total) by mouth 2 (two) times daily with a meal.   OLANZapine (ZYPREXA) 15 MG tablet Take 1 tablet (15 mg total) by mouth at bedtime.   OXcarbazepine (TRILEPTAL) 150 MG tablet TAKE 1 TABLET BY MOUTH 2 TIMES DAILY   PARoxetine (PAXIL) 10 MG tablet Take 1.5 tablets (15 mg total) by mouth daily.   polyethylene glycol (MIRALAX / GLYCOLAX) 17 g packet Take 17 g by mouth daily.   pravastatin (PRAVACHOL) 40 MG tablet TAKE 1 TABLET BY MOUTH DAILY   Psyllium (METAMUCIL) 0.36 g CAPS Take by mouth.   risperiDONE  (RISPERDAL) 2 MG tablet Take 1 tablet (2 mg total) by mouth at bedtime.   terbinafine (LAMISIL) 1 % cream Apply 1 application topically 2 (two) times daily. For 7 days   traMADol (ULTRAM) 50 MG tablet tramadol 50 mg tablet  Take 1 tablet every 6 hours by oral route as needed.   No facility-administered medications prior to visit.    Review of Systems  {Labs  Heme  Chem  Endocrine  Serology  Results Review (optional):23779}   Objective    There were no vitals taken for this visit. {Show previous vital signs (optional):23777}   Physical Exam  ***  No results found for any visits on 06/30/21.  Assessment & Plan     ***  No follow-ups on file.      {provider attestation***:1}   Lelon Huh, MD  [facility] [phone] (phone) [phone] (fax)  [place]

## 2021-07-01 ENCOUNTER — Other Ambulatory Visit: Payer: Self-pay | Admitting: Psychiatry

## 2021-07-01 DIAGNOSIS — F209 Schizophrenia, unspecified: Secondary | ICD-10-CM

## 2021-07-01 DIAGNOSIS — G2401 Drug induced subacute dyskinesia: Secondary | ICD-10-CM

## 2021-07-02 NOTE — Telephone Encounter (Signed)
Tried multiple times to contact [place], his sister, to schedule AWV but the call would not go through.  A mychart message was sent to patient w/ appt. On 10/02/21 at 2:00 p.m.

## 2021-07-21 ENCOUNTER — Other Ambulatory Visit
Admission: RE | Admit: 2021-07-21 | Discharge: 2021-07-21 | Disposition: A | Discharge status: Home / Self Care | Payer: Medicare Other | Attending: Psychiatry | Admitting: Psychiatry

## 2021-07-21 ENCOUNTER — Encounter: Payer: Self-pay | Admitting: Psychiatry

## 2021-07-21 ENCOUNTER — Ambulatory Visit (INDEPENDENT_AMBULATORY_CARE_PROVIDER_SITE_OTHER): Payer: Medicare Other | Admitting: Psychiatry

## 2021-07-21 ENCOUNTER — Other Ambulatory Visit: Payer: Self-pay

## 2021-07-21 VITALS — BP 128/73 | HR 85 | Temp 98.2°F | Wt 248.8 lb

## 2021-07-21 DIAGNOSIS — Z79899 Other long term (current) drug therapy: Secondary | ICD-10-CM | POA: Diagnosis not present

## 2021-07-21 DIAGNOSIS — F203 Undifferentiated schizophrenia: Secondary | ICD-10-CM | POA: Diagnosis not present

## 2021-07-21 DIAGNOSIS — F411 Generalized anxiety disorder: Secondary | ICD-10-CM

## 2021-07-21 DIAGNOSIS — E871 Hypo-osmolality and hyponatremia: Secondary | ICD-10-CM | POA: Insufficient documentation

## 2021-07-21 DIAGNOSIS — F209 Schizophrenia, unspecified: Secondary | ICD-10-CM

## 2021-07-21 DIAGNOSIS — G2401 Drug induced subacute dyskinesia: Secondary | ICD-10-CM | POA: Diagnosis not present

## 2021-07-21 LAB — COMPREHENSIVE METABOLIC PANEL
ALT: 26 U/L (ref 0–44)
AST: 27 U/L (ref 15–41)
Albumin: 3.7 g/dL (ref 3.5–5.0)
Alkaline Phosphatase: 79 U/L (ref 38–126)
Anion gap: 10 (ref 5–15)
BUN: 13 mg/dL (ref 8–23)
CO2: 27 mmol/L (ref 22–32)
Calcium: 8.9 mg/dL (ref 8.9–10.3)
Chloride: 99 mmol/L (ref 98–111)
Creatinine, Ser: 1.44 mg/dL — ABNORMAL HIGH (ref 0.61–1.24)
GFR, Estimated: 52 mL/min — ABNORMAL LOW (ref >60)
Glucose, Bld: 91 mg/dL (ref 70–99)
Potassium: 4.2 mmol/L (ref 3.5–5.1)
Sodium: 136 mmol/L (ref 135–145)
Total Bilirubin: 0.7 mg/dL (ref 0.3–1.2)
Total Protein: 6.7 g/dL (ref 6.5–8.1)

## 2021-07-21 MED ORDER — PAROXETINE HCL 10 MG PO TABS: 15.0000 mg | ORAL_TABLET | Freq: Every day | ORAL | 0 refills | Status: DC

## 2021-07-21 MED ORDER — RISPERIDONE 2 MG PO TABS: 2.0000 mg | ORAL_TABLET | Freq: Every day | ORAL | 0 refills | Status: DC

## 2021-07-21 MED ORDER — OLANZAPINE 15 MG PO TABS: 15.0000 mg | ORAL_TABLET | Freq: Every day | ORAL | 0 refills | Status: DC

## 2021-07-21 NOTE — Progress Notes (Signed)
[place] MD OP Progress Note  07/21/2021 1:37 PM ASH KOSIOR  MRN:  [id number]  Chief Complaint:  Chief Complaint   Follow-up; Anxiety; Depression    HPI: [name] is a 70 year old Caucasian male, divorced, lives in [place], has a history of schizophrenia, tardive dyskinesia was evaluated in office today.  Patient being a limited historian,  information was obtained from sister-Mary who was in session today.  Patient today reports he is currently doing okay.  According to sister he does have episodes of when he has an " attitude', however he has not had any significant agitation or irritability.  He is compliant on the medications.  Denies side effects.  She is worried about his appetite though and feels as though he is binging on sweets and carbs.  Otherwise she denies any other concerns.  Patient appeared to be calm in session, was cooperative and pleasant.  Appeared to be alert, oriented to situation self.  Denied any suicidality, homicidality or perceptual disturbances.  Patient denies any other concerns today.       Visit Diagnosis:    ICD-10-CM   1. Schizophrenia, undifferentiated ([place])  F20.3 OLANZapine (ZYPREXA) 15 MG tablet    2. GAD (generalized anxiety disorder)  F41.1 risperiDONE (RISPERDAL) 2 MG tablet    PARoxetine (PAXIL) 10 MG tablet    3. Tardive dyskinesia  G24.01     4. High risk medication use  Z79.899 CMP and Liver    5. Hyponatremia  E87.1       Past Psychiatric History: Reviewed past psychiatric history from progress note on 02/23/2018.  Past trials of Haldol, Klonopin, Seroquel  Past Medical History:  Past Medical History:  Diagnosis Date   Chicken pox    Dizziness    Measles    Mumps    Schizophrenia ([place])     Past Surgical History:  Procedure Laterality Date   ABDOMINAL SURGERY     COLON SURGERY     LAPAROTOMY N/A 03/20/2016   Procedure: EXPLORATORY LAPAROTOMY with lysis of adhesions;  Surgeon: Jules Husbands, MD;  Location: [facility]  ORS;  Service: General;  Laterality: N/A;   None      Family Psychiatric History: Reviewed family psychiatric history from progress note on 02/23/2018  Family History:  Family History  Problem Relation Age of Onset   Diabetes Mother    Heart disease Mother    Stroke Mother    Diabetes Father    Hypertension Father    Lung cancer Father    Cancer Father        lung cancer   Schizophrenia Sister    Lung cancer Sister    Colon cancer Sister     Social History: Reviewed social history from progress note on 02/23/2018 Social History   Socioeconomic History   Marital status: Divorced    Spouse name: Not on file   Number of children: 2   Years of education: Not on file   Highest education level: High school graduate  Occupational History   Occupation: Disabled  Tobacco Use   Smoking status: Former    Types: Cigarettes    Quit date: 08/04/2004    Years since quitting: 16.9   Smokeless tobacco: Former    Quit date: 08/04/2004  Vaping Use   Vaping Use: Never used  Substance and Sexual Activity   Alcohol use: No    Alcohol/week: 0.0 standard drinks   Drug use: No   Sexual activity: Not Currently  Other Topics Concern  Not on file  Social History Narrative   Not on file   Social Determinants of Health   Financial Resource Strain: Not on file  Food Insecurity: Not on file  Transportation Needs: Not on file  Physical Activity: Not on file  Stress: Not on file  Social Connections: Not on file    Allergies:  Allergies  Allergen Reactions   Vitamin B12 Other (See Comments)    interacted with other medications  interacted with other medications     Metabolic Disorder Labs: Lab Results  Component Value Date   HGBA1C 5.3 11/07/2020   MPG 105.41 11/07/2020   Lab Results  Component Value Date   PROLACTIN 17.3 (H) 11/07/2020   Lab Results  Component Value Date   CHOL 165 10/07/2020   TRIG 124 10/07/2020   HDL 38 (L) 10/07/2020   CHOLHDL 4.3 10/07/2020    LDLCALC 105 (H) 10/07/2020   LDLCALC 91 06/14/2019   Lab Results  Component Value Date   TSH 2.420 06/14/2019   TSH 1.333 05/04/2016    Therapeutic Level Labs: No results found for: LITHIUM No results found for: VALPROATE No components found for:  CBMZ  Current Medications: Current Outpatient Medications  Medication Sig Dispense Refill   cyclobenzaprine (FLEXERIL) 10 MG tablet Take by mouth.     donepezil (ARICEPT) 5 MG tablet Take by mouth.     fludrocortisone (FLORINEF) 0.1 MG tablet Take 1 tablet (100 mcg total) by mouth 2 (two) times daily. 180 tablet 0   ketoconazole (NIZORAL) 200 MG tablet      lubiprostone (AMITIZA) 24 MCG capsule Take 1 capsule (24 mcg total) by mouth 2 (two) times daily with a meal. 60 capsule 12   midodrine (PROAMATINE) 2.5 MG tablet Take 1 tablet (2.5 mg total) by mouth 3 (three) times daily. 90 tablet 3   naproxen (NAPROSYN) 500 MG tablet Take 1 tablet (500 mg total) by mouth 2 (two) times daily with a meal. 60 tablet 2   OXcarbazepine (TRILEPTAL) 150 MG tablet TAKE 1 TABLET BY MOUTH 2 TIMES DAILY 180 tablet 3   polyethylene glycol (MIRALAX / GLYCOLAX) 17 g packet Take 17 g by mouth daily.     pravastatin (PRAVACHOL) 40 MG tablet TAKE 1 TABLET BY MOUTH DAILY 30 tablet 12   Psyllium (METAMUCIL) 0.36 g CAPS Take by mouth.     terbinafine (LAMISIL) 1 % cream Apply 1 application topically 2 (two) times daily. For 7 days 42 g 0   traMADol (ULTRAM) 50 MG tablet tramadol 50 mg tablet  Take 1 tablet every 6 hours by oral route as needed.     OLANZapine (ZYPREXA) 15 MG tablet Take 1 tablet (15 mg total) by mouth at bedtime. 90 tablet 0   PARoxetine (PAXIL) 10 MG tablet Take 1.5 tablets (15 mg total) by mouth daily. 135 tablet 0   risperiDONE (RISPERDAL) 2 MG tablet Take 1 tablet (2 mg total) by mouth at bedtime. 90 tablet 0   No current facility-administered medications for this visit.     Musculoskeletal: Strength & Muscle Tone: within normal limits Gait  & Station: normal Patient leans: N/A  Psychiatric Specialty Exam: Review of Systems  Unable to perform ROS: Psychiatric disorder   Blood pressure 128/73, pulse 85, temperature 98.2 F (36.8 C), temperature source Temporal, weight 248 lb 12.8 oz (112.9 kg).Body mass index is 33.74 kg/m.  General Appearance: Casual  Eye Contact:  Good  Speech:  Clear and Coherent  Volume:  Normal  Mood:  Euthymic  Affect:  Congruent  Thought Process:  Goal Directed and Descriptions of Associations: Intact  Orientation:  Full (Time, Place, and Person)  Thought Content: Logical   Suicidal Thoughts:  No  Homicidal Thoughts:  No  Memory:  Immediate;   Fair Recent;   Fair Remote;   limited  Judgement:  Fair  Insight:  Fair  Psychomotor Activity:  Normal  Concentration:  Concentration: Fair and Attention Span: Fair  Recall:  [employer] of Knowledge:  limited  Language: Fair  Akathisia:  No  Handed:  Right  AIMS (if indicated): done  Assets:  Communication Skills Desire for Improvement Housing Social Support Transportation  ADL's:  Intact  Cognition: baseline ,limited  Sleep:  Fair   Screenings: [place] Office Visit from 07/21/2021 in [place] Visit from 02/23/2018 in [place] Total Score 0 3      GAD-7    [place] Visit from 04/21/2021 in [place]  Total GAD-7 Score 1      PHQ2-9    [place] Visit from 04/21/2021 in [place] Visit from 09/30/2020 in [place] Visit from 05/13/2020 in [place] Visit from 02/16/2017 in [place]  PHQ-2 Total Score 0 0 0 0  PHQ-9 Total Score 5 0 -- 0      [place] Office Visit from 07/21/2021 in [place] Office Visit from 04/21/2021 in [place] No Risk No Risk        Assessment and Plan: KHYRIN MABERY is a 70 year old Caucasian male, divorced, lives in [place], on [place], has a history of schizophrenia, GAD was evaluated in office today.  Patient is currently stable however will need monitoring sodium level since he is on Trileptal and recent low sodium level.  Plan as noted below.  Plan Schizophrenia-stable Zyprexa 15 mg p.o. nightly Risperidone 2 mg p.o. nightly Trileptal 150 mg p.o. twice daily Patient on 2 antipsychotics.  Previous attempts to taper him off off to monotherapy was not successful.  GAD-stable Paxil 15 mg p.o. daily  TD-chronic He is not interested in medications.  High risk medication use-reviewed and discussed labs dated 05/06/2021-creatinine 1.37, sodium slightly low at 133, chloride-low at 94, CBC with differential-within normal limits, Trileptal level-6-subtherapeutic.  Hyponatremia-We will repeat CMP with liver, given his recent lab abnormalities and since he is still on Trileptal.  Provided lab slip.  Collateral information obtained from sister as noted above.  Discussed dietary management, exercise more.  We will coordinate care with primary care provider.  Follow-up in clinic in 3 months in person.  This note was generated in part or whole with voice recognition software. Voice recognition is usually quite accurate but there are transcription errors that can and very often do occur. I apologize for any typographical errors that were not detected and corrected.      Ursula Alert, MD 07/22/2021, 1:37 PM

## 2021-07-22 ENCOUNTER — Telehealth: Payer: Self-pay | Admitting: Psychiatry

## 2021-07-22 NOTE — Telephone Encounter (Signed)
Contacted patient to discuss lab results-sodium level within normal limits-creatinine-1.44-elevated.  Advised to contact primary care provider for management.

## 2021-08-05 ENCOUNTER — Other Ambulatory Visit: Payer: Self-pay | Admitting: Family Medicine

## 2021-08-05 MED ORDER — FLUDROCORTISONE ACETATE 0.1 MG PO TABS: 100.0000 ug | ORAL_TABLET | Freq: Two times a day (BID) | ORAL | 3 refills | Status: DC

## 2021-08-05 NOTE — Telephone Encounter (Signed)
Requested medication (s) are due for refill today - yes  Requested medication (s) are on the active medication list -yes  Future visit scheduled -yes  Last refill: 04/21/21 #180  Notes to clinic: Request RF: non delegated Rx  Requested Prescriptions  Pending Prescriptions Disp Refills   fludrocortisone (FLORINEF) 0.1 MG tablet [Pharmacy Med Name: FLUDROCORTISONE ACETATE 0.1 MG TAB] 180 tablet 0    Sig: TAKE 1 TABLET BY MOUTH TWICE A DAY     Not Delegated - Endocrinology: Oral Corticosteroids - fludrocortisone Failed - 08/05/2021  9:13 AM      Failed - This refill cannot be delegated      Passed - K in normal range and within 180 days    Potassium  Date Value Ref Range Status  07/21/2021 4.2 3.5 - 5.1 mmol/L Final          Passed - Na in normal range and within 180 days    Sodium  Date Value Ref Range Status  07/21/2021 136 135 - 145 mmol/L Final  05/06/2021 133 (L) 134 - 144 mmol/L Final          Passed - Last BP in normal range    BP Readings from Last 1 Encounters:  05/06/21 (!) 161/88          Passed - Valid encounter within last 6 months    Recent Outpatient Visits           3 months ago High risk medication use   [facility] Birdie Sons, MD   10 months ago Annual physical exam   [facility] Birdie Sons, MD   1 year ago [place], Donald E, MD   2 years ago High risk medication use   [facility] Birdie Sons, MD   3 years ago Cellulitis of finger of right hand   [facility] Birdie Sons, MD       Future Appointments             In 3 weeks Fisher, Kirstie Peri, MD [facility]               Requested Prescriptions  Pending Prescriptions Disp Refills   fludrocortisone (FLORINEF) 0.1 MG tablet [Pharmacy Med Name: FLUDROCORTISONE ACETATE 0.1 MG TAB] 180 tablet 0    Sig: TAKE 1 TABLET BY MOUTH TWICE A DAY      Not Delegated - Endocrinology: Oral Corticosteroids - fludrocortisone Failed - 08/05/2021  9:13 AM      Failed - This refill cannot be delegated      Passed - K in normal range and within 180 days    Potassium  Date Value Ref Range Status  07/21/2021 4.2 3.5 - 5.1 mmol/L Final          Passed - Na in normal range and within 180 days    Sodium  Date Value Ref Range Status  07/21/2021 136 135 - 145 mmol/L Final  05/06/2021 133 (L) 134 - 144 mmol/L Final          Passed - Last BP in normal range    BP Readings from Last 1 Encounters:  05/06/21 (!) 161/88          Passed - Valid encounter within last 6 months    Recent Outpatient Visits           3 months ago High risk medication use   [facility] Birdie Sons, MD   10 months ago Annual physical exam   [facility] Birdie Sons, MD   1 year ago [place], Donald E, MD   2 years ago High risk medication use   [facility] Birdie Sons, MD   3 years ago Cellulitis of finger of right hand   [facility] Birdie Sons, MD       Future Appointments             In 3 weeks Fisher, Kirstie Peri, MD [facility] [place]

## 2021-08-05 NOTE — Telephone Encounter (Signed)
Medication Refill - Medication:  fludrocortisone (FLORINEF) 0.1 MG tablet  Has the patient contacted their pharmacy? No.  Preferred Pharmacy (with phone number or street name):  [facility] [place] - [address], [place] [address]  Phone:  [phone]  Fax:  [phone]   Agent: Please be advised that RX refills may take up to 3 business days. We ask that you follow-up with your pharmacy.

## 2021-08-26 ENCOUNTER — Other Ambulatory Visit: Payer: Self-pay | Admitting: Family Medicine

## 2021-08-31 ENCOUNTER — Telehealth (INDEPENDENT_AMBULATORY_CARE_PROVIDER_SITE_OTHER): Payer: Medicare Other | Admitting: Family Medicine

## 2021-08-31 DIAGNOSIS — N1831 Chronic kidney disease, stage 3a: Secondary | ICD-10-CM

## 2021-08-31 NOTE — Progress Notes (Signed)
MyChart Video Visit    Virtual Visit via Video Note   This visit type was conducted due to national recommendations for restrictions regarding the COVID-19 Pandemic (e.g. social distancing) in an effort to limit this patient's exposure and mitigate transmission in our community. This patient is at least at moderate risk for complications without adequate follow up. This format is felt to be most appropriate for this patient at this time. Physical exam was limited by quality of the video and audio technology used for the visit.   Patient location: home Provider location: [facility]   I discussed the limitations of evaluation and management by telemedicine and the availability of in person appointments. The patient expressed understanding and agreed to proceed.  Patient: [name]   DOB: 01/18/1951   70 y.o. Male  MRN: [id number] Visit Date: 08/31/2021  Today's healthcare provider: Lelon Huh, MD   No chief complaint on file.  Subjective    HPI  Follow up for abnormal labs  (Kidney functions)  The patient was last seen for this 6 weeks ago. Changes made at last visit include no changes, pt reports trying to drink more water.  Last metabolic panel Lab Results  Component Value Date   GLUCOSE 91 07/21/2021   NA 136 07/21/2021   K 4.2 07/21/2021   CL 99 07/21/2021   CO2 27 07/21/2021   BUN 13 07/21/2021   CREATININE 1.44 (H) 07/21/2021   GFRNONAA 52 (L) 07/21/2021   GFRAA 72 10/07/2020   CALCIUM 8.9 07/21/2021   PROT 6.7 07/21/2021   ALBUMIN 3.7 07/21/2021   LABGLOB 2.6 05/06/2021   AGRATIO 1.8 05/06/2021   BILITOT 0.7 07/21/2021   ALKPHOS 79 07/21/2021   AST 27 07/21/2021   ALT 26 07/21/2021   ANIONGAP 10 07/21/2021     -----------------------------------------------------------------------------------------  Pt states his sister tested positive for Covid but he is feeling fine today.  Medications: Outpatient Medications Prior to Visit  Medication Sig    cyclobenzaprine (FLEXERIL) 10 MG tablet Take by mouth.   donepezil (ARICEPT) 5 MG tablet Take by mouth.   fludrocortisone (FLORINEF) 0.1 MG tablet Take 1 tablet (100 mcg total) by mouth 2 (two) times daily.   ketoconazole (NIZORAL) 200 MG tablet    lubiprostone (AMITIZA) 24 MCG capsule Take 1 capsule (24 mcg total) by mouth 2 (two) times daily with a meal.   midodrine (PROAMATINE) 2.5 MG tablet Take 1 tablet (2.5 mg total) by mouth 3 (three) times daily.   naproxen (NAPROSYN) 500 MG tablet Take 1 tablet (500 mg total) by mouth 2 (two) times daily with a meal.   OLANZapine (ZYPREXA) 15 MG tablet Take 1 tablet (15 mg total) by mouth at bedtime.   OXcarbazepine (TRILEPTAL) 150 MG tablet TAKE 1 TABLET BY MOUTH 2 TIMES DAILY   PARoxetine (PAXIL) 10 MG tablet Take 1.5 tablets (15 mg total) by mouth daily.   polyethylene glycol (MIRALAX / GLYCOLAX) 17 g packet Take 17 g by mouth daily.   pravastatin (PRAVACHOL) 40 MG tablet TAKE 1 TABLET BY MOUTH DAILY   Psyllium (METAMUCIL) 0.36 g CAPS Take by mouth.   risperiDONE (RISPERDAL) 2 MG tablet Take 1 tablet (2 mg total) by mouth at bedtime.   terbinafine (LAMISIL) 1 % cream Apply 1 application topically 2 (two) times daily. For 7 days   traMADol (ULTRAM) 50 MG tablet tramadol 50 mg tablet  Take 1 tablet every 6 hours by oral route as needed.   No facility-administered medications prior  to visit.    Review of Systems  Constitutional: Negative.   Respiratory: Negative.    Cardiovascular: Negative.   Gastrointestinal:  Positive for constipation. Negative for abdominal distention, abdominal pain, anal bleeding, blood in stool, diarrhea, nausea, rectal pain and vomiting.  Endocrine: Negative.   Genitourinary:  Negative for decreased urine volume, dysuria, frequency, hematuria and urgency.  Neurological:  Negative for dizziness, light-headedness and headaches.     Objective    There were no vitals taken for this visit.   Physical Exam   Awake,  alert, oriented x 3. In no apparent distress    Assessment & Plan     1. Stage 3a chronic kidney disease ([place]) Reviewed labs results over the last 5 years and reassured patient that his renal functions have been very consistent. He needs to drink 8-12 glasses of water every day, avoid NSAIDs, and have renal functions check avery 6-12 months to ensure stability.        I discussed the assessment and treatment plan with the patient. The patient was provided an opportunity to ask questions and all were answered. The patient agreed with the plan and demonstrated an understanding of the instructions.   The patient was advised to call back or seek an in-person evaluation if the symptoms worsen or if the condition fails to improve as anticipated.  I provided 10 minutes of non-face-to-face time during this encounter.  The entirety of the information documented in the History of Present Illness, Review of Systems and Physical Exam were personally obtained by me. Portions of this information were initially documented by the CMA and reviewed by me for thoroughness and accuracy.    Lelon Huh, MD [facility] [phone] (phone) [phone] (fax)  [place]

## 2021-09-04 ENCOUNTER — Ambulatory Visit: Payer: Self-pay

## 2021-09-04 MED ORDER — MOLNUPIRAVIR EUA 200MG CAPSULE: 4.0000 | ORAL_CAPSULE | Freq: Two times a day (BID) | ORAL | 0 refills | Status: AC

## 2021-09-04 NOTE — Telephone Encounter (Signed)
Have sent prescription to [place].

## 2021-09-04 NOTE — Telephone Encounter (Signed)
Ms [name] reports test was negative this morning for COVID. She reports patient started with symptoms yesterday. Please advise.

## 2021-09-04 NOTE — Telephone Encounter (Signed)
Pts sister is calling for the pt to request antiviral -Pt has not tested positive. Pt has bad cough, and strachy  & hoarse throat. But has been exposed to COVID positive patient. Please advise  Pt.'s sister lives with pt. And reports she has COVID 19. Pt. Has cough, sore throat. Sister reports Dr. Caryn Section called her an anti-viral to pharmacy. Asking if he will call pt. In anti-viral.  [facility].Please advise.   Answer Assessment - Initial Assessment Questions 1. COVID-19 EXPOSURE: "Please describe how you were exposed to someone with a COVID-19 infection."     Sister is positive 2. PLACE of CONTACT: "Where were you when you were exposed to COVID-19?" (e.g., home, school, medical waiting room; which city?)     Home 3. TYPE of CONTACT: "How much contact was there?" (e.g., sitting next to, live in same house, work in same office, same building)     Same home 4. DURATION of CONTACT: "How long were you in contact with the COVID-19 patient?" (e.g., a few seconds, passed by person, a few minutes, 15 minutes or longer, live with the patient)     Live together 5. MASK: "Were you wearing a mask?" "Was the other person wearing a mask?" Note: wearing a mask reduces the risk of an otherwise close contact.     No 6. DATE of CONTACT: "When did you have contact with a COVID-19 patient?" (e.g., how many days ago)     This week 7. COMMUNITY SPREAD: "Are there lots of cases of COVID-19 (community spread) where you live?" (See public health department website, if unsure)       Yes 8. SYMPTOMS: "Do you have any symptoms?" (e.g., fever, cough, breathing difficulty, loss of taste or smell)     Cough, sore throat 9. VACCINE: "Have you gotten the COVID-19 vaccine?" If Yes, ask: "Which one, how many shots, when did you get it?"     Yes 10. BOOSTER: "Have you received your COVID-19 booster?" If Yes, ask: "Which one and when did you get it?"       Yes 11. PREGNANCY OR POSTPARTUM: "Is there any chance you are  pregnant?" "When was your last menstrual period?" "Did you deliver in the last 2 weeks?"       N/a 12. HIGH RISK: "Do you have any heart or lung problems?" (e.g., asthma , COPD, heart failure) "Do you have a weak immune system or other risk factors?" (e.g., HIV positive, chemotherapy, renal failure, diabetes mellitus, sickle cell anemia, obesity)       No 13. TRAVEL: "Have you traveled out of the country recently?" If Yes, ask: "When and where?"  Note: Travel becomes less relevant if there is widespread community transmission where the patient lives.       No  Protocols used: Coronavirus (COVID-19) Exposure-A-AH

## 2021-09-24 ENCOUNTER — Other Ambulatory Visit: Payer: Self-pay | Admitting: Family Medicine

## 2021-10-02 ENCOUNTER — Encounter: Payer: Medicare Other | Admitting: Family Medicine

## 2021-10-20 ENCOUNTER — Encounter: Payer: Self-pay | Admitting: Psychiatry

## 2021-10-20 ENCOUNTER — Ambulatory Visit (INDEPENDENT_AMBULATORY_CARE_PROVIDER_SITE_OTHER): Payer: Medicare Other | Admitting: Psychiatry

## 2021-10-20 ENCOUNTER — Other Ambulatory Visit: Payer: Self-pay

## 2021-10-20 VITALS — BP 152/79 | HR 71 | Wt 249.4 lb

## 2021-10-20 DIAGNOSIS — F411 Generalized anxiety disorder: Secondary | ICD-10-CM | POA: Diagnosis not present

## 2021-10-20 DIAGNOSIS — G2401 Drug induced subacute dyskinesia: Secondary | ICD-10-CM | POA: Diagnosis not present

## 2021-10-20 DIAGNOSIS — R030 Elevated blood-pressure reading, without diagnosis of hypertension: Secondary | ICD-10-CM | POA: Diagnosis not present

## 2021-10-20 DIAGNOSIS — F203 Undifferentiated schizophrenia: Secondary | ICD-10-CM | POA: Diagnosis not present

## 2021-10-20 MED ORDER — PAROXETINE HCL 10 MG PO TABS: 15.0000 mg | ORAL_TABLET | Freq: Every day | ORAL | 0 refills | Status: DC

## 2021-10-20 MED ORDER — RISPERIDONE 2 MG PO TABS: 2.0000 mg | ORAL_TABLET | Freq: Every day | ORAL | 0 refills | Status: DC

## 2021-10-20 MED ORDER — OLANZAPINE 15 MG PO TABS: 15.0000 mg | ORAL_TABLET | Freq: Every day | ORAL | 0 refills | Status: DC

## 2021-10-20 NOTE — Progress Notes (Signed)
[place] MD OP Progress Note  10/20/2021 11:36 AM [name]  MRN:  [id number]  Chief Complaint:  Chief Complaint   Follow-up; Depression    HPI: [name] is a 70 year old Caucasian male, divorced, lives in [place], has a history of schizophrenia, tardive dyskinesia was evaluated in office today.  Patient as well as sister participated in the session today.  Collateral information was obtained from sister-Mary.  As per sister patient is currently doing well.  He is compliant on medications.  His sleep is good.  Sister is worried about him eating too many sweets but otherwise denies any other concerns. Denies side effects to medications.  Patient appeared to be pleasant, cheerful, cooperative in session.  He denies any concerns.  Denies suicidality, homicidality or perceptual disturbances.  Patient reports sleep and appetite is fair.  Patient with elevated blood pressure reading in session today, according to sister patient is currently on midodrine for hypotension.  Agrees to follow-up with primary care provider.   Visit Diagnosis:    ICD-10-CM   1. Schizophrenia, undifferentiated ([place])  F20.3 OLANZapine (ZYPREXA) 15 MG tablet    2. GAD (generalized anxiety disorder)  F41.1 risperiDONE (RISPERDAL) 2 MG tablet    PARoxetine (PAXIL) 10 MG tablet    3. Tardive dyskinesia  G24.01     4. Elevated blood pressure reading  R03.0       Past Psychiatric History: Reviewed past psychiatric history from progress note on 02/23/2018.  Past trials of Haldol, Klonopin, Seroquel  Past Medical History:  Past Medical History:  Diagnosis Date   Chicken pox    Dizziness    Measles    Mumps    Schizophrenia ([place])     Past Surgical History:  Procedure Laterality Date   ABDOMINAL SURGERY     COLON SURGERY     LAPAROTOMY N/A 03/20/2016   Procedure: EXPLORATORY LAPAROTOMY with lysis of adhesions;  Surgeon: Jules Husbands, MD;  Location: [facility] ORS;  Service: General;  Laterality: N/A;    None      Family Psychiatric History: Reviewed family psychiatric history from progress note on 02/23/2018.  Family History:  Family History  Problem Relation Age of Onset   Diabetes Mother    Heart disease Mother    Stroke Mother    Diabetes Father    Hypertension Father    Lung cancer Father    Cancer Father        lung cancer   Schizophrenia Sister    Lung cancer Sister    Colon cancer Sister     Social History: Reviewed social history from progress note on 02/23/2018. Social History   Socioeconomic History   Marital status: Divorced    Spouse name: Not on file   Number of children: 2   Years of education: Not on file   Highest education level: High school graduate  Occupational History   Occupation: Disabled  Tobacco Use   Smoking status: Former    Types: Cigarettes    Quit date: 08/04/2004    Years since quitting: 17.2   Smokeless tobacco: Former    Quit date: 08/04/2004  Vaping Use   Vaping Use: Never used  Substance and Sexual Activity   Alcohol use: No    Alcohol/week: 0.0 standard drinks   Drug use: No   Sexual activity: Not Currently  Other Topics Concern   Not on file  Social History Narrative   Not on file   Social Determinants of Health  Financial Resource Strain: Not on file  Food Insecurity: Not on file  Transportation Needs: Not on file  Physical Activity: Not on file  Stress: Not on file  Social Connections: Not on file    Allergies:  Allergies  Allergen Reactions   Vitamin B12 Other (See Comments)    interacted with other medications  interacted with other medications     Metabolic Disorder Labs: Lab Results  Component Value Date   HGBA1C 5.3 11/07/2020   MPG 105.41 11/07/2020   Lab Results  Component Value Date   PROLACTIN 17.3 (H) 11/07/2020   Lab Results  Component Value Date   CHOL 165 10/07/2020   TRIG 124 10/07/2020   HDL 38 (L) 10/07/2020   CHOLHDL 4.3 10/07/2020   LDLCALC 105 (H) 10/07/2020   LDLCALC 91  06/14/2019   Lab Results  Component Value Date   TSH 2.420 06/14/2019   TSH 1.333 05/04/2016    Therapeutic Level Labs: No results found for: LITHIUM No results found for: VALPROATE No components found for:  CBMZ  Current Medications: Current Outpatient Medications  Medication Sig Dispense Refill   cyclobenzaprine (FLEXERIL) 10 MG tablet Take by mouth.     donepezil (ARICEPT) 5 MG tablet Take by mouth.     fludrocortisone (FLORINEF) 0.1 MG tablet Take 1 tablet (100 mcg total) by mouth 2 (two) times daily. 180 tablet 3   ketoconazole (NIZORAL) 200 MG tablet      lubiprostone (AMITIZA) 24 MCG capsule Take 1 capsule (24 mcg total) by mouth 2 (two) times daily with a meal. 60 capsule 12   midodrine (PROAMATINE) 2.5 MG tablet Take 1 tablet (2.5 mg total) by mouth 3 (three) times daily. 90 tablet 3   naproxen (NAPROSYN) 500 MG tablet Take 1 tablet (500 mg total) by mouth 2 (two) times daily with a meal. 60 tablet 2   OXcarbazepine (TRILEPTAL) 150 MG tablet TAKE 1 TABLET BY MOUTH 2 TIMES DAILY 180 tablet 3   polyethylene glycol (MIRALAX / GLYCOLAX) 17 g packet Take 17 g by mouth daily.     pravastatin (PRAVACHOL) 40 MG tablet TAKE 1 TABLET BY MOUTH DAILY 90 tablet 4   Psyllium (METAMUCIL) 0.36 g CAPS Take by mouth.     terbinafine (LAMISIL) 1 % cream Apply 1 application topically 2 (two) times daily. For 7 days 42 g 0   traMADol (ULTRAM) 50 MG tablet tramadol 50 mg tablet  Take 1 tablet every 6 hours by oral route as needed.     OLANZapine (ZYPREXA) 15 MG tablet Take 1 tablet (15 mg total) by mouth at bedtime. 90 tablet 0   PARoxetine (PAXIL) 10 MG tablet Take 1.5 tablets (15 mg total) by mouth daily. 135 tablet 0   risperiDONE (RISPERDAL) 2 MG tablet Take 1 tablet (2 mg total) by mouth at bedtime. 90 tablet 0   No current facility-administered medications for this visit.     Musculoskeletal: Strength & Muscle Tone: within normal limits Gait & Station: normal Patient leans:  N/A  Psychiatric Specialty Exam: Review of Systems  Psychiatric/Behavioral:  Negative for agitation, behavioral problems, confusion, decreased concentration, dysphoric mood, hallucinations, self-injury, sleep disturbance and suicidal ideas. The patient is not nervous/anxious and is not hyperactive.   All other systems reviewed and are negative.  Blood pressure (!) 152/79, pulse 71, weight 249 lb 6.4 oz (113.1 kg).Body mass index is 33.82 kg/m.  General Appearance: Casual  Eye Contact:  Good  Speech:  Normal Rate  Volume:  Normal  Mood:  Euthymic  Affect:  Appropriate  Thought Process:  Goal Directed and Descriptions of Associations: Intact  Orientation:  Full (Time, Place, and Person)  Thought Content: Logical   Suicidal Thoughts:  No  Homicidal Thoughts:  No  Memory:  Immediate;   Fair Recent;   Fair Remote;   limited  Judgement:  Fair  Insight:  Fair  Psychomotor Activity:  Normal  Concentration:  Concentration: Fair and Attention Span: Fair  Recall:  [employer] of Knowledge: Fair  Language: Fair  Akathisia:  No  Handed:  Right  AIMS (if indicated): done, 0  Assets:  Communication Skills Desire for Improvement Housing Social Support Transportation  ADL's:  Intact  Cognition: WNL  Sleep:  Fair   Screenings: [place] Office Visit from 10/20/2021 in [place] Visit from 07/21/2021 in [place] Visit from 02/23/2018 in [place] Total Score 0 0 3      [place] Visit from 04/21/2021 in [place]  Total GAD-7 Score 1      PHQ2-9    [place] Visit from 10/20/2021 in [place] Visit from 04/21/2021 in [place] Visit from 09/30/2020 in [place] Visit from 05/13/2020 in [place] Visit from 02/16/2017 in [place]  PHQ-2 Total Score 0 0 0 0 0  PHQ-9 Total Score -- 5 0 -- 0      [place] Office Visit from 07/21/2021 in [place] Office Visit from 04/21/2021 in [place] No Risk No Risk        Assessment and Plan: [name] is a 70 year old Caucasian male, divorced, lives in [place], on [place], has a history of schizophrenia, GAD was evaluated in office today.  Patient is currently stable, however was noted to have elevated blood pressure reading in session today.  Discussed the following plan.  Plan Schizophrenia-stable Zyprexa 15 mg p.o. nightly Risperidone 2 mg p.o. nightly Trileptal 150 mg p.o. twice daily Patient is on 2 antipsychotics, previous attempts to taper him off to monotherapy was not successful.  GAD-stable Paxil 15 mg p.o. daily  TD-chronic Patient is not interested in medications  Elevated blood pressure reading-discussed to monitor and keep a log of blood pressure.  We will coordinate care with primary care provider.  Patient currently on midodrine 3 times a day.  Collateral information obtained from sister who was present in session.  Follow-up in clinic in 3 to 4 months or sooner if needed in person.  This note was generated in part or whole with voice recognition software. Voice recognition is usually quite accurate but there are transcription errors that can and very often do occur. I apologize for any typographical errors that were not detected and corrected.       Ursula Alert, MD 10/20/2021, 11:36 AM

## 2021-10-22 ENCOUNTER — Other Ambulatory Visit: Payer: Self-pay | Admitting: Family Medicine

## 2021-10-22 DIAGNOSIS — I951 Orthostatic hypotension: Secondary | ICD-10-CM

## 2021-10-22 NOTE — Telephone Encounter (Signed)
Requested medication (s) are due for refill today:   Provider to review  Requested medication (s) are on the active medication list:   Yes  Future visit scheduled:   No   Last ordered: 05/06/2021 #90, 3  Non delegated refill    Requested Prescriptions  Pending Prescriptions Disp Refills   midodrine (PROAMATINE) 2.5 MG tablet [Pharmacy Med Name: MIDODRINE HCL 2.5 MG TAB] 90 tablet 3    Sig: TAKE 1 TABLET BY MOUTH 3 TIMES DAILY     Not Delegated - Cardiovascular: Midodrine Failed - 10/22/2021 12:03 PM      Failed - This refill cannot be delegated      Failed - Last BP in normal range    BP Readings from Last 1 Encounters:  05/06/21 (!) 161/88          Passed - Valid encounter within last 12 months    Recent Outpatient Visits           1 month ago Stage 3a chronic kidney disease ([place])   [facility] Birdie Sons, MD   5 months ago High risk medication use   [facility] Birdie Sons, MD   1 year ago Annual physical exam   [facility] Birdie Sons, MD   1 year ago [place], Donald E, MD   2 years ago High risk medication use   [facility] Birdie Sons, MD

## 2021-10-23 NOTE — Telephone Encounter (Signed)
LOV:  08/28/2021

## 2022-02-16 ENCOUNTER — Ambulatory Visit (INDEPENDENT_AMBULATORY_CARE_PROVIDER_SITE_OTHER): Payer: Medicare Other | Admitting: Psychiatry

## 2022-02-16 ENCOUNTER — Other Ambulatory Visit: Payer: Self-pay

## 2022-02-16 ENCOUNTER — Encounter: Payer: Self-pay | Admitting: Psychiatry

## 2022-02-16 VITALS — BP 179/75 | HR 63 | Temp 98.5°F | Wt 251.6 lb

## 2022-02-16 DIAGNOSIS — F411 Generalized anxiety disorder: Secondary | ICD-10-CM | POA: Diagnosis not present

## 2022-02-16 DIAGNOSIS — Z79899 Other long term (current) drug therapy: Secondary | ICD-10-CM | POA: Diagnosis not present

## 2022-02-16 DIAGNOSIS — F203 Undifferentiated schizophrenia: Secondary | ICD-10-CM

## 2022-02-16 MED ORDER — PAROXETINE HCL 10 MG PO TABS: 15.0000 mg | ORAL_TABLET | Freq: Every day | ORAL | 0 refills | Status: DC

## 2022-02-16 MED ORDER — RISPERIDONE 2 MG PO TABS: 2.0000 mg | ORAL_TABLET | Freq: Every day | ORAL | 0 refills | Status: DC

## 2022-02-16 MED ORDER — OLANZAPINE 15 MG PO TABS: 15.0000 mg | ORAL_TABLET | Freq: Every day | ORAL | 0 refills | Status: DC

## 2022-02-16 NOTE — Progress Notes (Signed)
[facility] MD OP Progress Note  02/16/2022 1:03 PM [name]  MRN:  [id number]  Chief Complaint:  Chief Complaint  Patient presents with   Follow-up: 71 year old Caucasian male, with history of schizophrenia, generalized anxiety disorder, GAD, presented for medication management.   HPI: [name] is a 71 year old Caucasian male, divorced, lives in [place], has a history of schizophrenia, tardive dyskinesia, GAD, was evaluated in office today.  Patient as well as sister-Mary, participated in the evaluation today.  Patient today appeared to be alert, oriented to person, situation.  Patient appeared to be pleasant.  Denies any mood lability.  Denies any sleep problems.  Reports appetite as good.  He tends to overeat at times.  Likely due to medications that he is on like the olanzapine and risperidone.  Patient is compliant on his medications.  Denies any other side effects.  As per sister patient is currently doing fairly well.  Patient as well as per sister, would like to stay on this medication regimen for now.  Not interested in being tapered off of one of the antipsychotics, worried about decompensation.  Denies any other concerns today.    Visit Diagnosis:    ICD-10-CM   1. Schizophrenia, undifferentiated ([place])  F20.3 OLANZapine (ZYPREXA) 15 MG tablet    2. GAD (generalized anxiety disorder)  F41.1 risperiDONE (RISPERDAL) 2 MG tablet    PARoxetine (PAXIL) 10 MG tablet    3. High risk medication use  Z79.899 Hepatic function panel    Platelet count      Past Psychiatric History: Reviewed past psychiatric history from progress note on 02/23/2018.  Past trials of Haldol, Klonopin, Seroquel.  Past Medical History:  Past Medical History:  Diagnosis Date   Chicken pox    Dizziness    Measles    Mumps    Schizophrenia ([place])     Past Surgical History:  Procedure Laterality Date   ABDOMINAL SURGERY     COLON SURGERY     LAPAROTOMY N/A 03/20/2016   Procedure:  EXPLORATORY LAPAROTOMY with lysis of adhesions;  Surgeon: Jules Husbands, MD;  Location: [facility] ORS;  Service: General;  Laterality: N/A;   None      Family Psychiatric History: Reviewed family psychiatric history from progress note on 02/23/2018.  Family History:  Family History  Problem Relation Age of Onset   Diabetes Mother    Heart disease Mother    Stroke Mother    Diabetes Father    Hypertension Father    Lung cancer Father    Cancer Father        lung cancer   Schizophrenia Sister    Lung cancer Sister    Colon cancer Sister     Social History: Reviewed social history from progress note on 02/23/2018. Social History   Socioeconomic History   Marital status: Divorced    Spouse name: Not on file   Number of children: 2   Years of education: Not on file   Highest education level: High school graduate  Occupational History   Occupation: Disabled  Tobacco Use   Smoking status: Former    Types: Cigarettes    Quit date: 08/04/2004    Years since quitting: 17.5   Smokeless tobacco: Former    Quit date: 08/04/2004  Vaping Use   Vaping Use: Never used  Substance and Sexual Activity   Alcohol use: No    Alcohol/week: 0.0 standard drinks   Drug use: No   Sexual activity: Not Currently  Other Topics Concern   Not on file  Social History Narrative   Not on file   Social Determinants of Health   Financial Resource Strain: Not on file  Food Insecurity: Not on file  Transportation Needs: Not on file  Physical Activity: Not on file  Stress: Not on file  Social Connections: Not on file    Allergies:  Allergies  Allergen Reactions   Vitamin B12 Other (See Comments)    interacted with other medications  interacted with other medications     Metabolic Disorder Labs: Lab Results  Component Value Date   HGBA1C 5.3 11/07/2020   MPG 105.41 11/07/2020   Lab Results  Component Value Date   PROLACTIN 17.3 (H) 11/07/2020   Lab Results  Component Value Date   CHOL  165 10/07/2020   TRIG 124 10/07/2020   HDL 38 (L) 10/07/2020   CHOLHDL 4.3 10/07/2020   LDLCALC 105 (H) 10/07/2020   LDLCALC 91 06/14/2019   Lab Results  Component Value Date   TSH 2.420 06/14/2019   TSH 1.333 05/04/2016    Therapeutic Level Labs: No results found for: LITHIUM No results found for: VALPROATE No components found for:  CBMZ  Current Medications: Current Outpatient Medications  Medication Sig Dispense Refill   cyclobenzaprine (FLEXERIL) 10 MG tablet Take by mouth.     fludrocortisone (FLORINEF) 0.1 MG tablet Take 1 tablet (100 mcg total) by mouth 2 (two) times daily. 180 tablet 3   ketoconazole (NIZORAL) 200 MG tablet      lubiprostone (AMITIZA) 24 MCG capsule Take 1 capsule (24 mcg total) by mouth 2 (two) times daily with a meal. 60 capsule 12   midodrine (PROAMATINE) 2.5 MG tablet TAKE 1 TABLET BY MOUTH 3 TIMES DAILY 90 tablet 3   naproxen (NAPROSYN) 500 MG tablet Take 1 tablet (500 mg total) by mouth 2 (two) times daily with a meal. 60 tablet 2   OXcarbazepine (TRILEPTAL) 150 MG tablet TAKE 1 TABLET BY MOUTH 2 TIMES DAILY 180 tablet 3   polyethylene glycol (MIRALAX / GLYCOLAX) 17 g packet Take 17 g by mouth daily.     pravastatin (PRAVACHOL) 40 MG tablet TAKE 1 TABLET BY MOUTH DAILY 90 tablet 4   Psyllium (METAMUCIL) 0.36 g CAPS Take by mouth.     terbinafine (LAMISIL) 1 % cream Apply 1 application topically 2 (two) times daily. For 7 days 42 g 0   traMADol (ULTRAM) 50 MG tablet tramadol 50 mg tablet  Take 1 tablet every 6 hours by oral route as needed.     donepezil (ARICEPT) 5 MG tablet Take by mouth.     OLANZapine (ZYPREXA) 15 MG tablet Take 1 tablet (15 mg total) by mouth at bedtime. 90 tablet 0   PARoxetine (PAXIL) 10 MG tablet Take 1.5 tablets (15 mg total) by mouth daily. 135 tablet 0   risperiDONE (RISPERDAL) 2 MG tablet Take 1 tablet (2 mg total) by mouth at bedtime. 90 tablet 0   No current facility-administered medications for this visit.      Musculoskeletal: Strength & Muscle Tone: within normal limits Gait & Station: normal Patient leans: N/A  Psychiatric Specialty Exam: Review of Systems  Psychiatric/Behavioral:  Negative for agitation, behavioral problems, confusion, decreased concentration, dysphoric mood, hallucinations, self-injury, sleep disturbance and suicidal ideas. The patient is not nervous/anxious and is not hyperactive.   All other systems reviewed and are negative.  Blood pressure (!) 179/75, pulse 63, temperature 98.5 F (36.9 C), temperature source Temporal, weight  251 lb 9.6 oz (114.1 kg).Body mass index is 34.12 kg/m.  General Appearance: Casual  Eye Contact:  Fair  Speech:  Clear and Coherent  Volume:  Normal  Mood:  Euthymic  Affect:  Congruent  Thought Process:  Goal Directed and Descriptions of Associations: Intact  Orientation:  Other:  person, situation , place  Thought Content: Logical   Suicidal Thoughts:  No  Homicidal Thoughts:  No  Memory:  Immediate;   Fair Recent;   Fair Remote;   limited  Judgement:  Fair  Insight:  Shallow  Psychomotor Activity:  Normal  Concentration:  Concentration: Fair and Attention Span: Fair  Recall:  [employer] of Knowledge: Fair  Language: Fair  Akathisia:  No  Handed:  Right  AIMS (if indicated): done,0  Assets:  Communication Skills Desire for Improvement Housing Social Support Transportation  ADL's:  Intact  Cognition: baseline  Sleep:  Fair   Screenings: [place] Office Visit from 02/16/2022 in [facility] Office Visit from 10/20/2021 in [facility] Visit from 07/21/2021 in [facility] Visit from 02/23/2018 in [facility] Total Score 0 0 0 [place] Visit from 04/21/2021 in [facility]  Total GAD-7 Score 1      PHQ2-9     [place] Visit from 02/16/2022 in [facility] Visit from 10/20/2021 in [facility] Visit from 04/21/2021 in [facility] Visit from 09/30/2020 in [facility] Visit from 05/13/2020 in [facility]  PHQ-2 Total Score 0 0 0 0 0  PHQ-9 Total Score 5 -- 5 0 --      [place] Office Visit from 07/21/2021 in [facility] Visit from 04/21/2021 in [facility] No Risk No Risk        Assessment and Plan: [name] is a 71 year old Caucasian male, divorced, lives in [place], [place], has a history of schizophrenia, GAD was evaluated in office today.  Patient is currently stable.  Plan Schizophrenia-stable Zyprexa 15 mg p.o. nightly Risperidone 2 mg p.o. nightly Trileptal 150 mg p.o. twice daily Patient on 2 antipsychotics, wants to stay on this regimen for now.  Previous attempts to taper him off to monotherapy was not successful, resulted in decompensation.  GAD-stable Paxil 15 mg p.o. daily  High risk medication use-will order platelet count, LFT since patient is on oxcarbazepine..  Provided lab slip.  Patient with elevated blood pressure reading, continues to need to follow up with primary care provider.  Collateral information obtained from sister as noted above.  Follow-up in clinic in 3 to 4 months or sooner if needed.    Collaboration of Care: Collaboration of Care: Primary Care Provider AEB patient encouraged to follow up with primary care provider for management of high blood pressure  Patient/Guardian was advised Release of Information must be obtained prior to any record release in order to collaborate their care with an outside provider. Patient/Guardian was advised if they have not already done so to contact the registration department  to sign all necessary forms in order for [place] to release information regarding their care.   Consent: Patient/Guardian gives verbal consent for treatment and assignment of benefits for services provided during this visit. Patient/Guardian expressed understanding and agreed  to proceed.   This note was generated in part or whole with voice recognition software. Voice recognition is usually quite accurate but there are transcription errors that can and very often do occur. I apologize for any typographical errors that were not detected and corrected.      Ursula Alert, MD 02/16/2022, 1:03 PM

## 2022-03-10 ENCOUNTER — Ambulatory Visit (INDEPENDENT_AMBULATORY_CARE_PROVIDER_SITE_OTHER): Payer: Medicare Other

## 2022-03-10 VITALS — Wt 251.0 lb

## 2022-03-10 DIAGNOSIS — Z0000 Encounter for general adult medical examination without abnormal findings: Secondary | ICD-10-CM

## 2022-03-10 NOTE — Progress Notes (Signed)
?Virtual Visit via Telephone Note ? ?I connected with  [name] on 03/10/22 at  1:00 PM EDT by telephone and verified that I am speaking with the correct person using two identifiers. ? ?Location: ?Patient: home ?Provider: [facility] ?Persons participating in the virtual visit: patient/Nurse Health Advisor ?  ?I discussed the limitations, risks, security and privacy concerns of performing an evaluation and management service by telephone and the availability of in person appointments. The patient expressed understanding and agreed to proceed. ? ?Interactive audio and video telecommunications were attempted between this nurse and patient, however failed, due to patient having technical difficulties OR patient did not have access to video capability.  We continued and completed visit with audio only. ? ?Some vital signs may be absent or patient reported.  ? ?Bradley David, LPN ? ?Subjective:  ? [name] is a 71 y.o. male who presents for Medicare Annual/Subsequent preventive examination. ? ?Review of Systems    ? ?  ? ?   ?Objective:  ?  ?There were no vitals filed for this visit. ?There is no height or weight on file to calculate BMI. ? ? ?  01/23/2019  ?  8:00 PM 01/23/2019  ? 12:50 PM 12/01/2018  ? 11:08 AM 08/27/2017  ?  2:25 PM 04/06/2017  ?  8:50 AM 01/25/2017  ? 12:49 PM 08/03/2016  ?  8:12 AM  ?Advanced Directives  ?Does Patient Have a Medical Advance Directive? No No  No     ?Would patient like information on creating a medical advance directive? No - Patient declined   No - Patient declined     ?  ? Information is confidential and restricted. Go to Review Flowsheets to unlock data.  ? ? ?Current Medications (verified) ?Outpatient Encounter Medications as of 03/10/2022  ?Medication Sig  ? cyclobenzaprine (FLEXERIL) 10 MG tablet Take by mouth.  ? donepezil (ARICEPT) 5 MG tablet Take by mouth.  ? fludrocortisone (FLORINEF) 0.1 MG tablet Take 1 tablet (100 mcg total) by mouth 2 (two) times daily.  ?  ketoconazole (NIZORAL) 200 MG tablet   ? lubiprostone (AMITIZA) 24 MCG capsule Take 1 capsule (24 mcg total) by mouth 2 (two) times daily with a meal.  ? midodrine (PROAMATINE) 2.5 MG tablet TAKE 1 TABLET BY MOUTH 3 TIMES DAILY  ? naproxen (NAPROSYN) 500 MG tablet Take 1 tablet (500 mg total) by mouth 2 (two) times daily with a meal.  ? OLANZapine (ZYPREXA) 15 MG tablet Take 1 tablet (15 mg total) by mouth at bedtime.  ? OXcarbazepine (TRILEPTAL) 150 MG tablet TAKE 1 TABLET BY MOUTH 2 TIMES DAILY  ? PARoxetine (PAXIL) 10 MG tablet Take 1.5 tablets (15 mg total) by mouth daily.  ? polyethylene glycol (MIRALAX / GLYCOLAX) 17 g packet Take 17 g by mouth daily.  ? pravastatin (PRAVACHOL) 40 MG tablet TAKE 1 TABLET BY MOUTH DAILY  ? Psyllium (METAMUCIL) 0.36 g CAPS Take by mouth.  ? risperiDONE (RISPERDAL) 2 MG tablet Take 1 tablet (2 mg total) by mouth at bedtime.  ? terbinafine (LAMISIL) 1 % cream Apply 1 application topically 2 (two) times daily. For 7 days  ? traMADol (ULTRAM) 50 MG tablet tramadol 50 mg tablet ? Take 1 tablet every 6 hours by oral route as needed.  ? ?No facility-administered encounter medications on file as of 03/10/2022.  ? ? ?Allergies (verified) ?Vitamin b12  ? ?History: ?Past Medical History:  ?Diagnosis Date  ? Chicken pox   ? Dizziness   ?  Measles   ? Mumps   ? Schizophrenia ([place])   ? ?Past Surgical History:  ?Procedure Laterality Date  ? ABDOMINAL SURGERY    ? COLON SURGERY    ? LAPAROTOMY N/A 03/20/2016  ? Procedure: EXPLORATORY LAPAROTOMY with lysis of adhesions;  Surgeon: Jules Husbands, MD;  Location: [facility] ORS;  Service: General;  Laterality: N/A;  ? None    ? ?Family History  ?Problem Relation Age of Onset  ? Diabetes Mother   ? Heart disease Mother   ? Stroke Mother   ? Diabetes Father   ? Hypertension Father   ? Lung cancer Father   ? Cancer Father   ?     lung cancer  ? Schizophrenia Sister   ? Lung cancer Sister   ? Colon cancer Sister   ? ?Social History  ? ?Socioeconomic History  ?  Marital status: Divorced  ?  Spouse name: Not on file  ? Number of children: 2  ? Years of education: Not on file  ? Highest education level: High school graduate  ?Occupational History  ? Occupation: Disabled  ?Tobacco Use  ? Smoking status: Former  ?  Types: Cigarettes  ?  Quit date: 08/04/2004  ?  Years since quitting: 17.6  ? Smokeless tobacco: Former  ?  Quit date: 08/04/2004  ?Vaping Use  ? Vaping Use: Never used  ?Substance and Sexual Activity  ? Alcohol use: No  ?  Alcohol/week: 0.0 standard drinks  ? Drug use: No  ? Sexual activity: Not Currently  ?Other Topics Concern  ? Not on file  ?Social History Narrative  ? Not on file  ? ?Social Determinants of Health  ? ?Financial Resource Strain: Not on file  ?Food Insecurity: Not on file  ?Transportation Needs: Not on file  ?Physical Activity: Not on file  ?Stress: Not on file  ?Social Connections: Not on file  ? ? ?Tobacco Counseling ?Counseling given: Not Answered ? ? ?Clinical Intake: ? ?  ? ?  ? ?  ? ?  ? ?  ? ?Diabetic?no ? ?  ? ?  ? ? ?Activities of Daily Living ?   ? View : No data to display.  ?  ?  ?  ? ? ?Patient Care Team: ?Birdie Sons, MD as PCP - General (Family Medicine) ?Marjie Skiff, MD as Consulting Physician (Psychiatry) ?Vladimir Crofts, MD as Consulting Physician (Neurology) ? ?Indicate any recent Medical Services you may have received from other than [facility] providers in the past year (date may be approximate). ? ?   ?Assessment:  ? This is a routine wellness examination for [name]. ? ?Hearing/Vision screen ?No results found. ? ?Dietary issues and exercise activities discussed: ?  ? ? Goals Addressed   ?None ?  ? ?Depression Screen ? ?  02/16/2022  ? 10:54 AM 10/20/2021  ? 10:42 AM 07/21/2021  ? 11:40 AM 04/21/2021  ? 12:15 PM 09/30/2020  ?  3:02 PM 05/13/2020  ?  1:30 PM 02/16/2017  ?  2:08 PM  ?PHQ 2/9 Scores  ?PHQ - 2 Score     0 0 0  ?PHQ- 9 Score     0  0  ?Exception Documentation         ?Not completed         ?  ? Information is confidential  and restricted. Go to Review Flowsheets to unlock data.  ?  ?Fall Risk ? ?  09/30/2020  ?  3:02 PM 05/13/2020  ?  1:30 PM 02/16/2017  ?  2:09 PM 09/20/2016  ? 11:40 AM  ?Fall Risk   ?Falls in the past year? 0 0 No Yes  ?Number falls in past yr: 0 0  1  ?Injury with Fall? 0 0  No  ?Follow up Falls evaluation completed Falls evaluation completed  Falls prevention discussed  ? ? ?FALL RISK PREVENTION PERTAINING TO THE HOME: ? ?Any stairs in or around the home? No  ?If so, are there any without handrails? No  ?Home free of loose throw rugs in walkways, pet beds, electrical cords, etc? Yes  ?Adequate lighting in your home to reduce risk of falls? Yes  ? ?ASSISTIVE DEVICES UTILIZED TO PREVENT FALLS: ? ?Life alert? No  ?Use of a cane, walker or w/c? No  ?Grab bars in the bathroom? No  ?Shower chair or bench in shower? No  ?Elevated toilet seat or a handicapped toilet? No  ? ? ?Cognitive Function:  ?  ? ?  09/30/2020  ?  3:03 PM  ?6CIT Screen  ?What Year? 0 points  ?What month? 0 points  ?What time? 0 points  ?Count back from 20 0 points  ?Months in reverse 0 points  ?Repeat phrase 10 points  ?Total Score 10 points  ? ? ?Immunizations ?Immunization History  ?Administered Date(s) Administered  ? Fluad Quad(high Dose 65+) 09/30/2020  ? Influenza Split 09/01/2011  ? Influenza, High Dose Seasonal PF 09/20/2016  ? Moderna Sars-Covid-2 Vaccination 06/08/2020, 07/06/2020  ? Pneumococcal Conjugate-13 05/16/2015  ? Pneumococcal Polysaccharide-23 09/20/2016  ? ? ?TDAP status: Due, Education has been provided regarding the importance of this vaccine. Advised may receive this vaccine at local pharmacy or Health Dept. Aware to provide a copy of the vaccination record if obtained from local pharmacy or Health Dept. Verbalized acceptance and understanding. ? ?Flu Vaccine status: Declined, Education has been provided regarding the importance of this vaccine but patient still declined. Advised may receive this vaccine at local pharmacy or Health  Dept. Aware to provide a copy of the vaccination record if obtained from local pharmacy or Health Dept. Verbalized acceptance and understanding. ? ?Pneumococcal vaccine status: Up to date ? ?Covid-19 vaccine st

## 2022-03-10 NOTE — Patient Instructions (Addendum)
[name] , ?Thank you for taking time to come for your Medicare Wellness Visit. I appreciate your ongoing commitment to your health goals. Please review the following plan we discussed and let me know if I can assist you in the future.  ? ?Screening recommendations/referrals: ?Colonoscopy: n/d ?Recommended yearly ophthalmology/optometry visit for glaucoma screening and checkup ?Recommended yearly dental visit for hygiene and checkup ? ?Vaccinations: ?Influenza vaccine: n/d ?Pneumococcal vaccine: 09/20/16 ?Tdap vaccine: n/d ?Shingles vaccine: n/d   ?Covid-19: 06/08/20, 07/06/20 ? ?Advanced directives: no ? ?Conditions/risks identified: none ? ?Next appointment: Follow up in one year for your annual wellness visit. - 03/15/23 @ 11:15am by phone ? ?Preventive Care 75 Years and Older, Male ?Preventive care refers to lifestyle choices and visits with your health care provider that can promote health and wellness. ?What does preventive care include? ?A yearly physical exam. This is also called an annual well check. ?Dental exams once or twice a year. ?Routine eye exams. Ask your health care provider how often you should have your eyes checked. ?Personal lifestyle choices, including: ?Daily care of your teeth and gums. ?Regular physical activity. ?Eating a healthy diet. ?Avoiding tobacco and drug use. ?Limiting alcohol use. ?Practicing safe sex. ?Taking low doses of aspirin every day. ?Taking vitamin and mineral supplements as recommended by your health care provider. ?What happens during an annual well check? ?The services and screenings done by your health care provider during your annual well check will depend on your age, overall health, lifestyle risk factors, and family history of disease. ?Counseling  ?Your health care provider may ask you questions about your: ?Alcohol use. ?Tobacco use. ?Drug use. ?Emotional well-being. ?Home and relationship well-being. ?Sexual activity. ?Eating habits. ?History of falls. ?Memory  and ability to understand (cognition). ?Work and work Statistician. ?Screening  ?You may have the following tests or measurements: ?Height, weight, and BMI. ?Blood pressure. ?Lipid and cholesterol levels. These may be checked every 5 years, or more frequently if you are over 79 years old. ?Skin check. ?Lung cancer screening. You may have this screening every year starting at age 77 if you have a 30-pack-year history of smoking and currently smoke or have quit within the past 15 years. ?Fecal occult blood test (FOBT) of the stool. You may have this test every year starting at age 20. ?Flexible sigmoidoscopy or colonoscopy. You may have a sigmoidoscopy every 5 years or a colonoscopy every 10 years starting at age 22. ?Prostate cancer screening. Recommendations will vary depending on your family history and other risks. ?Hepatitis C blood test. ?Hepatitis B blood test. ?Sexually transmitted disease (STD) testing. ?Diabetes screening. This is done by checking your blood sugar (glucose) after you have not eaten for a while (fasting). You may have this done every 1-3 years. ?Abdominal aortic aneurysm (AAA) screening. You may need this if you are a current or former smoker. ?Osteoporosis. You may be screened starting at age 6 if you are at high risk. ?Talk with your health care provider about your test results, treatment options, and if necessary, the need for more tests. ?Vaccines  ?Your health care provider may recommend certain vaccines, such as: ?Influenza vaccine. This is recommended every year. ?Tetanus, diphtheria, and acellular pertussis (Tdap, Td) vaccine. You may need a Td booster every 10 years. ?Zoster vaccine. You may need this after age 56. ?Pneumococcal 13-valent conjugate (PCV13) vaccine. One dose is recommended after age 3. ?Pneumococcal polysaccharide (PPSV23) vaccine. One dose is recommended after age 7. ?Talk to your health care provider  about which screenings and vaccines you need and how often you  need them. ?This information is not intended to replace advice given to you by your health care provider. Make sure you discuss any questions you have with your health care provider. ?Document Released: 12/26/2015 Document Revised: 08/18/2016 Document Reviewed: 09/30/2015 ?Elsevier Interactive Patient Education ? 2017 [place]. ? ?Fall Prevention in the Home ?Falls can cause injuries. They can happen to people of all ages. There are many things you can do to make your home safe and to help prevent falls. ?What can I do on the outside of my home? ?Regularly fix the edges of walkways and driveways and fix any cracks. ?Remove anything that might make you trip as you walk through a door, such as a raised step or threshold. ?Trim any bushes or trees on the path to your home. ?Use bright outdoor lighting. ?Clear any walking paths of anything that might make someone trip, such as rocks or tools. ?Regularly check to see if handrails are loose or broken. Make sure that both sides of any steps have handrails. ?Any raised decks and porches should have guardrails on the edges. ?Have any leaves, snow, or ice cleared regularly. ?Use sand or salt on walking paths during winter. ?Clean up any spills in your garage right away. This includes oil or grease spills. ?What can I do in the bathroom? ?Use night lights. ?Install grab bars by the toilet and in the tub and shower. Do not use towel bars as grab bars. ?Use non-skid mats or decals in the tub or shower. ?If you need to sit down in the shower, use a plastic, non-slip stool. ?Keep the floor dry. Clean up any water that spills on the floor as soon as it happens. ?Remove soap buildup in the tub or shower regularly. ?Attach bath mats securely with double-sided non-slip rug tape. ?Do not have throw rugs and other things on the floor that can make you trip. ?What can I do in the bedroom? ?Use night lights. ?Make sure that you have a light by your bed that is easy to reach. ?Do not  use any sheets or blankets that are too big for your bed. They should not hang down onto the floor. ?Have a firm chair that has side arms. You can use this for support while you get dressed. ?Do not have throw rugs and other things on the floor that can make you trip. ?What can I do in the kitchen? ?Clean up any spills right away. ?Avoid walking on wet floors. ?Keep items that you use a lot in easy-to-reach places. ?If you need to reach something above you, use a strong step stool that has a grab bar. ?Keep electrical cords out of the way. ?Do not use floor polish or wax that makes floors slippery. If you must use wax, use non-skid floor wax. ?Do not have throw rugs and other things on the floor that can make you trip. ?What can I do with my stairs? ?Do not leave any items on the stairs. ?Make sure that there are handrails on both sides of the stairs and use them. Fix handrails that are broken or loose. Make sure that handrails are as long as the stairways. ?Check any carpeting to make sure that it is firmly attached to the stairs. Fix any carpet that is loose or worn. ?Avoid having throw rugs at the top or bottom of the stairs. If you do have throw rugs, attach them to the floor  with carpet tape. ?Make sure that you have a light switch at the top of the stairs and the bottom of the stairs. If you do not have them, ask someone to add them for you. ?What else can I do to help prevent falls? ?Wear shoes that: ?Do not have high heels. ?Have rubber bottoms. ?Are comfortable and fit you well. ?Are closed at the toe. Do not wear sandals. ?If you use a stepladder: ?Make sure that it is fully opened. Do not climb a closed stepladder. ?Make sure that both sides of the stepladder are locked into place. ?Ask someone to hold it for you, if possible. ?Clearly mark and make sure that you can see: ?Any grab bars or handrails. ?First and last steps. ?Where the edge of each step is. ?Use tools that help you move around (mobility  aids) if they are needed. These include: ?Canes. ?Walkers. ?Scooters. ?Crutches. ?Turn on the lights when you go into a dark area. Replace any light bulbs as soon as they burn out. ?Set up your furniture so

## 2022-05-11 ENCOUNTER — Other Ambulatory Visit: Payer: Self-pay | Admitting: Family Medicine

## 2022-05-11 DIAGNOSIS — I951 Orthostatic hypotension: Secondary | ICD-10-CM

## 2022-05-27 ENCOUNTER — Other Ambulatory Visit: Payer: Self-pay | Admitting: Psychiatry

## 2022-05-27 DIAGNOSIS — F411 Generalized anxiety disorder: Secondary | ICD-10-CM

## 2022-06-01 ENCOUNTER — Telehealth (INDEPENDENT_AMBULATORY_CARE_PROVIDER_SITE_OTHER): Payer: Medicare Other | Admitting: Psychiatry

## 2022-06-01 ENCOUNTER — Telehealth: Payer: Self-pay | Admitting: Psychiatry

## 2022-06-01 ENCOUNTER — Encounter: Payer: Self-pay | Admitting: Psychiatry

## 2022-06-01 DIAGNOSIS — Z79899 Other long term (current) drug therapy: Secondary | ICD-10-CM

## 2022-06-01 DIAGNOSIS — F209 Schizophrenia, unspecified: Secondary | ICD-10-CM

## 2022-06-01 DIAGNOSIS — F411 Generalized anxiety disorder: Secondary | ICD-10-CM

## 2022-06-01 MED ORDER — OXCARBAZEPINE 150 MG PO TABS: 150.0000 mg | ORAL_TABLET | Freq: Two times a day (BID) | ORAL | 0 refills | Status: DC

## 2022-06-01 MED ORDER — OLANZAPINE 15 MG PO TABS: 15.0000 mg | ORAL_TABLET | Freq: Every day | ORAL | 0 refills | Status: DC

## 2022-06-01 MED ORDER — PAROXETINE HCL 10 MG PO TABS: 15.0000 mg | ORAL_TABLET | Freq: Every day | ORAL | 0 refills | Status: DC

## 2022-06-01 NOTE — Telephone Encounter (Signed)
Patient claims he had his labs that we ordered done the same day it was ordered in March 2023. I do not see it as done in the system - will have Anthony contact lab to verify.  Platelet count and Hepatic function panel.

## 2022-06-01 NOTE — Progress Notes (Signed)
Virtual Visit via Video Note  I connected with [name] on 06/01/22 at 11:00 AM EDT by a video enabled telemedicine application and verified that I am speaking with the correct person using two identifiers.  Location Provider Location : [facility] Patient Location : Home  Participants: Patient , [name] ( sister) ,Provider   I discussed the limitations of evaluation and management by telemedicine and the availability of in person appointments. The patient expressed understanding and agreed to proceed.   I discussed the assessment and treatment plan with the patient. The patient was provided an opportunity to ask questions and all were answered. The patient agreed with the plan and demonstrated an understanding of the instructions.   The patient was advised to call back or seek an in-person evaluation if the symptoms worsen or if the condition fails to improve as anticipated.                                                                     [place] MD OP Progress Note  06/01/2022 1:52 PM NICOLO TOMKO  MRN:  [id number]  Chief Complaint:  Chief Complaint  Patient presents with   Follow-up: 71 year old Caucasian male with history of schizophrenia, generalized anxiety disorder, GAD, presented for medication management.   HPI: [name] is a 71 year old Caucasian male, divorced, lives in [place], has a history of schizophrenia, GAD was evaluated by telemedicine today.  Patient as well as sister-Mary participated in the evaluation today.  His sister provided collateral information.  Patient today appeared to be alert, oriented to person place time situation.  Reports although he does have episodes when he gets irritable or angry he has been handling it well.  He has been able to walk outside get some fresh air and cope with his irritability.  He has been compliant on his medications as prescribed.  Denies side effects.  Patient denies any suicidality, homicidality or perceptual  disturbances.  Patient reports sleep as good.  Patient denies any other concerns today.  Collateral information obtained from sister who reports patient has irritability however she has been able to sit down with him and talk him through it, that has been helpful.  He has been taking walks outside with his dog as well as his grandson.  Per sister patient was able to get his labs that were ordered last visit, however this has not been reported yet.     Visit Diagnosis:    ICD-10-CM   1. Schizophrenia in full remission with history of multiple episodes (HCC)  F20.9 OXcarbazepine (TRILEPTAL) 150 MG tablet    2. GAD (generalized anxiety disorder)  F41.1 PARoxetine (PAXIL) 10 MG tablet    3. High risk medication use  Z79.899 OLANZapine (ZYPREXA) 15 MG tablet      Past Psychiatric History: Reviewed past psychiatric history from progress note on 02/23/2018.  Past trials of Haldol, Klonopin, Seroquel.  Past Medical History:  Past Medical History:  Diagnosis Date   Chicken pox    Dizziness    Measles    Mumps    Schizophrenia ([place])     Past Surgical History:  Procedure Laterality Date   ABDOMINAL SURGERY     COLON SURGERY     LAPAROTOMY N/A 03/20/2016  Procedure: EXPLORATORY LAPAROTOMY with lysis of adhesions;  Surgeon: Jules Husbands, MD;  Location: [facility] ORS;  Service: General;  Laterality: N/A;   None      Family Psychiatric History: Reviewed family psychiatric history from progress note on 02/23/2018.  Family History:  Family History  Problem Relation Age of Onset   Diabetes Mother    Heart disease Mother    Stroke Mother    Diabetes Father    Hypertension Father    Lung cancer Father    Cancer Father        lung cancer   Schizophrenia Sister    Lung cancer Sister    Colon cancer Sister     Social History: Reviewed social history from progress note on 02/23/2018. Social History   Socioeconomic History   Marital status: Divorced    Spouse name: Not on file   Number  of children: 2   Years of education: Not on file   Highest education level: High school graduate  Occupational History   Occupation: Disabled  Tobacco Use   Smoking status: Former    Types: Cigarettes    Quit date: 08/04/2004    Years since quitting: 17.8   Smokeless tobacco: Former    Quit date: 08/04/2004  Vaping Use   Vaping Use: Never used  Substance and Sexual Activity   Alcohol use: No    Alcohol/week: 0.0 standard drinks of alcohol   Drug use: No   Sexual activity: Not Currently  Other Topics Concern   Not on file  Social History Narrative   Not on file   Social Determinants of Health   Financial Resource Strain: Low Risk  (03/10/2022)   Overall Financial Resource Strain (CARDIA)    Difficulty of Paying Living Expenses: Not hard at all  Food Insecurity: No [place] (03/10/2022)   Hunger Vital Sign    Worried About Running Out of Food in the Last Year: Never true    [place] in the Last Year: Never true  Transportation Needs: No Transportation Needs (03/10/2022)   PRAPARE - Hydrologist (Medical): No    Lack of Transportation (Non-Medical): No  Physical Activity: Insufficiently Active (03/10/2022)   Exercise Vital Sign    Days of Exercise per Week: 3 days    Minutes of Exercise per Session: 30 min  Stress: No Stress Concern Present (03/10/2022)   Junior    Feeling of Stress : Not at all  Social Connections: Moderately Isolated (03/10/2022)   Social Connection and Isolation Panel [NHANES]    Frequency of Communication with Friends and Family: Twice a week    Frequency of Social Gatherings with Friends and Family: Three times a week    Attends Religious Services: More than 4 times per year    Active Member of Clubs or Organizations: No    Attends Archivist Meetings: Never    Marital Status: Divorced    Allergies:  Allergies  Allergen Reactions    Vitamin B12 Other (See Comments)    interacted with other medications  interacted with other medications     Metabolic Disorder Labs: Lab Results  Component Value Date   HGBA1C 5.3 11/07/2020   MPG 105.41 11/07/2020   Lab Results  Component Value Date   PROLACTIN 17.3 (H) 11/07/2020   Lab Results  Component Value Date   CHOL 165 10/07/2020   TRIG 124 10/07/2020   HDL  38 (L) 10/07/2020   CHOLHDL 4.3 10/07/2020   LDLCALC 105 (H) 10/07/2020   LDLCALC 91 06/14/2019   Lab Results  Component Value Date   TSH 2.420 06/14/2019   TSH 1.333 05/04/2016    Therapeutic Level Labs: No results found for: "LITHIUM" No results found for: "VALPROATE" No results found for: "CBMZ"  Current Medications: Current Outpatient Medications  Medication Sig Dispense Refill   donepezil (ARICEPT) 5 MG tablet Take by mouth.     fludrocortisone (FLORINEF) 0.1 MG tablet Take 1 tablet (100 mcg total) by mouth 2 (two) times daily. 180 tablet 3   midodrine (PROAMATINE) 2.5 MG tablet TAKE ONE TABLET BY MOUTH THREE TIMES A DAY (Patient taking differently: Take 2.5 mg by mouth. Takes two times a day) 90 tablet 3   OVER THE COUNTER MEDICATION [place] for constipation     pravastatin (PRAVACHOL) 40 MG tablet TAKE 1 TABLET BY MOUTH DAILY 90 tablet 4   Psyllium (METAMUCIL) 0.36 g CAPS Take by mouth.     risperiDONE (RISPERDAL) 2 MG tablet TAKE 1 TABLET BY MOUTH AT BEDTIME 90 tablet 0   OLANZapine (ZYPREXA) 15 MG tablet Take 1 tablet (15 mg total) by mouth at bedtime. 90 tablet 0   OXcarbazepine (TRILEPTAL) 150 MG tablet Take 1 tablet (150 mg total) by mouth 2 (two) times daily. 180 tablet 0   PARoxetine (PAXIL) 10 MG tablet Take 1.5 tablets (15 mg total) by mouth daily. 135 tablet 0   No current facility-administered medications for this visit.     Musculoskeletal: Strength & Muscle Tone:  UTA Gait & Station: normal Patient leans: N/A  Psychiatric Specialty Exam: Review of Systems   Psychiatric/Behavioral: Negative.    All other systems reviewed and are negative.   There were no vitals taken for this visit.There is no height or weight on file to calculate BMI.  General Appearance: Casual  Eye Contact:  Fair  Speech:  Clear and Coherent  Volume:  Normal  Mood:  Euthymic  Affect:  Congruent  Thought Process:  Goal Directed and Descriptions of Associations: Intact  Orientation:  Full (Time, Place, and Person)  Thought Content: Logical   Suicidal Thoughts:  No  Homicidal Thoughts:  No  Memory:  Immediate;   Fair Recent;   Fair Remote;   Limited  Judgement:  Fair  Insight:  Shallow  Psychomotor Activity:  Normal  Concentration:  Concentration: Fair and Attention Span: Fair  Recall:  [employer] of Knowledge: Fair  Language: Fair  Akathisia:  No  Handed:  Right  AIMS (if indicated): done  Assets:  Communication Skills Housing Social Support Transportation  ADL's:  Intact  Cognition: Baseline  Sleep:  Fair   Screenings: AIMS    Flowsheet Row Video Visit from 06/01/2022 in [place] Office Visit from 02/16/2022 in [place] [facility] Visit from 10/20/2021 in [facility] [place] Visit from 07/21/2021 in [facility] Visit from 02/23/2018 in [facility] Total Score 0 0 0 0 3      GAD-7    Flowsheet Row Video Visit from 06/01/2022 in [facility] Office Visit from 04/21/2021 in [facility]  Total GAD-7 Score 2 1      PHQ2-9    [place] Video Visit from 06/01/2022 in [facility] from 03/10/2022 in [place] Visit from 02/16/2022 in [facility] Office Visit  from 10/20/2021 in [facility] Office Visit from 04/21/2021 in [facility]  PHQ-2 Total Score 0 0 0 0 0  PHQ-9 Total Score -- -- 5 -- 5      Flowsheet Row Video Visit from 06/01/2022 in [facility] Office Visit from 07/21/2021 in [facility] Office Visit from 04/21/2021 in [facility] No Risk No Risk No Risk        Assessment and Plan: [name] is a 71 year old Caucasian male, divorced, lives in [place], has a history of schizophrenia, GAD was evaluated by telemedicine today.  Patient is currently stable.  Plan Schizophrenia-stable Zyprexa 15 mg p.o. nightly Risperidone 2 mg p.o. nightly Trileptal 150 mg p.o. twice daily Discussed long-term adverse side effects of being on 2 antipsychotic medications.  Previous attempts to taper him off to monotherapy was not successful.  GAD-stable Paxil 50 mg p.o. daily  High risk medication use-ordered platelet count, LFT-will have Jessica CMA contact [facility] lab since patient's sister reports this was already completed and we have not received the results yet.  Collateral information obtained from patient's sister as noted above.  Follow-up in clinic in 3 months or sooner if needed.   Consent: Patient/Guardian gives verbal consent for treatment and assignment of benefits for services provided during this visit. Patient/Guardian expressed understanding and agreed to proceed.   This note was generated in part or whole with voice recognition software. Voice recognition is usually quite accurate but there are transcription errors that can and very often do occur. I apologize for any typographical errors that were not detected and corrected.      Ursula Alert, MD 06/01/2022, 1:52 PM

## 2022-06-10 ENCOUNTER — Ambulatory Visit (INDEPENDENT_AMBULATORY_CARE_PROVIDER_SITE_OTHER): Payer: Medicare Other | Admitting: Physician Assistant

## 2022-06-10 ENCOUNTER — Encounter: Payer: Self-pay | Admitting: Physician Assistant

## 2022-06-10 ENCOUNTER — Ambulatory Visit: Payer: Self-pay

## 2022-06-10 VITALS — BP 126/72 | HR 64 | Temp 98.9°F | Resp 16 | Wt 248.0 lb

## 2022-06-10 DIAGNOSIS — I951 Orthostatic hypotension: Secondary | ICD-10-CM

## 2022-06-10 NOTE — Telephone Encounter (Signed)
2 days  Yesterday fever Na 113   111 now taking salt Needs to go back to 2 pills Looks sick and BP 111/69 yesterday 113 Pale looking in eyes. No diarrhea.   Chief Complaint: low sodium level, "looks sick around his eyes and pale" Symptoms: Na level 111 today. BP reading lower Frequency: 2 days Pertinent Negatives: Patient denies bleeding Disposition: '[]'$ ED /'[]'$ Urgent Care (no appt availability in office) / '[x]'$ Appointment(In office/virtual)/ '[]'$  [facility]/ '[]'$ Home Care/ '[]'$ Refused Recommended Disposition /'[]'$ [facility]/ '[]'$  Follow-up with PCP Additional Notes: called office and spoke with Arbie Cookey about pt and was advised Dr Caryn Section not in office all week. Advised to make appt with another provider or UC. Called sister of pt back who agreed to come in and take sibling to another provider. Appt scheduled today at 1500.  Reason for Disposition  [1] New-onset pallor AND [2] mild change AND [3]  present < 3 days (Exception: brief pallor from being cold or emotional shock)  Answer Assessment - Initial Assessment Questions 1. COLOR of SKIN: "What best describes the color of the skin?"     pale 2. LOCATION: "What part of the body is pale?" (e.g., face, hand, palms, whole body)     Face  3. CHANGES: "Is this a change?" If Yes, ask: "How much of a change?"     yes 4. PALLOR ELSEWHERE: "Has the color of the lips, gums or nailbeds also changed?     no 5. ONSET: When did the paleness begin?     2 days 6. CAUSE: What do you think is causing the pale skin?     Low Na level 7. BLEEDING: "Any recent blood loss?"     o 8. OTHER SYMPTOMS: "Do you have any other symptoms?" (e.g., black stool, blood in stool, dizziness, fainting, weakness)     BP 111/69  Protocols used: Pale Skin-A-AH

## 2022-06-10 NOTE — Progress Notes (Signed)
I,Kendrell Lottman Robinson,acting as a Education administrator for [employer], PA-C.,have documented all relevant documentation on the behalf of Mardene Speak, PA-C,as directed by  [employer], PA-C while in the presence of [employer], PA-C.   Established patient visit   Patient: [name]   DOB: November 17, 1951   71 y.o. Male  MRN: [id number] Visit Date: 06/10/2022  Today's healthcare provider: Mardene Speak, PA-C   CC: low sodium level   Subjective    Patient presents with sister, [name] stating patient's sodium lever is low.  States she can always tell when it's low when patient is pale around the eyes, weakness and a bit off balance.  Sister gives him salt or makes a salty drink.  Reports Dr. Caryn Section has him on 3 sodium pill daily.  Patient takes 2 sodium pills daily because he feels strange on 3 pills. However, Sister states she feels like he may need more.    Per chart review, BP was 179/75 on 02/16/22  Medications: Outpatient Medications Prior to Visit  Medication Sig   fludrocortisone (FLORINEF) 0.1 MG tablet Take 1 tablet (100 mcg total) by mouth 2 (two) times daily.   midodrine (PROAMATINE) 2.5 MG tablet TAKE ONE TABLET BY MOUTH THREE TIMES A DAY (Patient taking differently: Take 2.5 mg by mouth. Takes two times a day)   OLANZapine (ZYPREXA) 15 MG tablet Take 1 tablet (15 mg total) by mouth at bedtime.   OVER THE COUNTER MEDICATION Rexall for constipation   OXcarbazepine (TRILEPTAL) 150 MG tablet Take 1 tablet (150 mg total) by mouth 2 (two) times daily.   PARoxetine (PAXIL) 10 MG tablet Take 1.5 tablets (15 mg total) by mouth daily.   pravastatin (PRAVACHOL) 40 MG tablet TAKE 1 TABLET BY MOUTH DAILY   Psyllium (METAMUCIL) 0.36 g CAPS Take by mouth.   risperiDONE (RISPERDAL) 2 MG tablet TAKE 1 TABLET BY MOUTH AT BEDTIME   donepezil (ARICEPT) 5 MG tablet Take by mouth.   No facility-administered medications prior to visit.    Review of Systems  All other systems reviewed and are  negative. Except HPI     Objective    BP 126/72 (BP Location: Right Arm, Patient Position: Sitting, Cuff Size: Normal)   Pulse 64   Temp 98.9 F (37.2 C) (Oral)   Resp 16   Wt 248 lb (112.5 kg)   SpO2 100%   BMI 33.63 kg/m    Physical Exam Vitals reviewed.  Constitutional:      Appearance: Normal appearance. He is obese.  HENT:     Head: Normocephalic and atraumatic.  Cardiovascular:     Rate and Rhythm: Normal rate and regular rhythm.     Pulses: Normal pulses.     Heart sounds: Normal heart sounds.  Pulmonary:     Effort: Pulmonary effort is normal.     Breath sounds: Normal breath sounds.  Abdominal:     General: Abdomen is flat. Bowel sounds are normal.     Palpations: Abdomen is soft.  Musculoskeletal:     Right lower leg: No edema.     Left lower leg: No edema.  Skin:    General: Skin is warm.  Neurological:     Mental Status: He is alert.  Psychiatric:        Behavior: Behavior normal.        Thought Content: Thought content normal.        Judgment: Judgment normal.     No results found for any visits  on 06/10/22.  Assessment & Plan    1. Hypotension BP today was 126/72 Keep current dose of midodrine twice daily as pt cannot tolerate taking this medication 3 times daily. Pt is not a good historian, has a PMHx of schizophrenia. Strongly encouraged to measure BP at home daily to monitor BP On decreased dose of midodrine.  - CBC with Differential/Platelet - Comprehensive metabolic panel For monitoring Reassess in 2 weeks FU in 2 weeks  The patient was advised to call back or seek an in-person evaluation if the symptoms worsen or if the condition fails to improve as anticipated.  I discussed the assessment and treatment plan with the patient. The patient was provided an opportunity to ask questions and all were answered. The patient agreed with the plan and demonstrated an understanding of the instructions.  The entirety of the information  documented in the History of Present Illness, Review of Systems and Physical Exam were personally obtained by me. Portions of this information were initially documented by the CMA and reviewed by me for thoroughness and accuracy.  Portions of this note were created using dictation software and may contain typographical errors.   Mardene Speak, PA-C  [facility] [phone] (phone) [phone] (fax)  [place]

## 2022-06-11 LAB — CBC WITH DIFFERENTIAL/PLATELET
Basophils Absolute: 0.1 10*3/uL (ref 0.0–0.2)
Basos: 1 %
EOS (ABSOLUTE): 0.2 10*3/uL (ref 0.0–0.4)
Eos: 2 %
Hematocrit: 36.2 % — ABNORMAL LOW (ref 37.5–51.0)
Hemoglobin: 12.6 g/dL — ABNORMAL LOW (ref 13.0–17.7)
Immature Grans (Abs): 0 10*3/uL (ref 0.0–0.1)
Immature Granulocytes: 0 %
Lymphocytes Absolute: 2.6 10*3/uL (ref 0.7–3.1)
Lymphs: 27 %
MCH: 30.2 pg (ref 26.6–33.0)
MCHC: 34.8 g/dL (ref 31.5–35.7)
MCV: 87 fL (ref 79–97)
Monocytes Absolute: 1.1 10*3/uL — ABNORMAL HIGH (ref 0.1–0.9)
Monocytes: 11 %
Neutrophils Absolute: 5.9 10*3/uL (ref 1.4–7.0)
Neutrophils: 59 %
Platelets: 287 10*3/uL (ref 150–450)
RBC: 4.17 x10E6/uL (ref 4.14–5.80)
RDW: 12.7 % (ref 11.6–15.4)
WBC: 9.9 10*3/uL (ref 3.4–10.8)

## 2022-06-11 LAB — COMPREHENSIVE METABOLIC PANEL
ALT: 23 IU/L (ref 0–44)
AST: 22 IU/L (ref 0–40)
Albumin/Globulin Ratio: 1.6 (ref 1.2–2.2)
Albumin: 4.3 g/dL (ref 3.7–4.7)
Alkaline Phosphatase: 81 IU/L (ref 44–121)
BUN/Creatinine Ratio: 8 — ABNORMAL LOW (ref 10–24)
BUN: 11 mg/dL (ref 8–27)
Bilirubin Total: 0.4 mg/dL (ref 0.0–1.2)
CO2: 25 mmol/L (ref 20–29)
Calcium: 9.6 mg/dL (ref 8.6–10.2)
Chloride: 97 mmol/L (ref 96–106)
Creatinine, Ser: 1.32 mg/dL — ABNORMAL HIGH (ref 0.76–1.27)
Globulin, Total: 2.7 g/dL (ref 1.5–4.5)
Glucose: 90 mg/dL (ref 70–99)
Potassium: 4.6 mmol/L (ref 3.5–5.2)
Sodium: 136 mmol/L (ref 134–144)
Total Protein: 7 g/dL (ref 6.0–8.5)
eGFR: 57 mL/min/{1.73_m2} — ABNORMAL LOW (ref >59)

## 2022-06-11 NOTE — Progress Notes (Signed)
Hello [name] ,   Your labwork results all are back and stable for you  No changes need to be made to medications, and no further tests need to be ordered.  Any questions please reach out to the office or message me on MyChart! Best,  Mardene Speak, PA-C

## 2022-06-25 ENCOUNTER — Ambulatory Visit: Payer: Medicare Other | Admitting: Family Medicine

## 2022-08-24 ENCOUNTER — Other Ambulatory Visit: Payer: Self-pay | Admitting: Family Medicine

## 2022-08-31 ENCOUNTER — Other Ambulatory Visit: Payer: Self-pay | Admitting: Psychiatry

## 2022-08-31 DIAGNOSIS — F411 Generalized anxiety disorder: Secondary | ICD-10-CM

## 2022-09-07 ENCOUNTER — Encounter: Payer: Self-pay | Admitting: Psychiatry

## 2022-09-07 ENCOUNTER — Telehealth (INDEPENDENT_AMBULATORY_CARE_PROVIDER_SITE_OTHER): Payer: Medicare Other | Admitting: Psychiatry

## 2022-09-07 DIAGNOSIS — Z79899 Other long term (current) drug therapy: Secondary | ICD-10-CM | POA: Diagnosis not present

## 2022-09-07 DIAGNOSIS — F411 Generalized anxiety disorder: Secondary | ICD-10-CM

## 2022-09-07 DIAGNOSIS — F209 Schizophrenia, unspecified: Secondary | ICD-10-CM | POA: Diagnosis not present

## 2022-09-07 MED ORDER — PAROXETINE HCL 10 MG PO TABS: 15.0000 mg | ORAL_TABLET | Freq: Every day | ORAL | 0 refills | Status: DC

## 2022-09-07 MED ORDER — OLANZAPINE 15 MG PO TABS: 15.0000 mg | ORAL_TABLET | Freq: Every day | ORAL | 0 refills | Status: DC

## 2022-09-07 MED ORDER — OXCARBAZEPINE 150 MG PO TABS: 150.0000 mg | ORAL_TABLET | Freq: Two times a day (BID) | ORAL | 0 refills | Status: DC

## 2022-09-07 NOTE — Progress Notes (Unsigned)
Virtual Visit via Telephone Note  I connected with [name] on 09/07/22 at  4:00 PM EDT by telephone and verified that I am speaking with the correct person using two identifiers.  Location Provider Location : [facility] Patient Location : Home  Participants: Patient ,sister , Provider   I discussed the limitations, risks, security and privacy concerns of performing an evaluation and management service by telephone and the availability of in person appointments. I also discussed with the patient that there may be a patient responsible charge related to this service. The patient expressed understanding and agreed to proceed.   I discussed the assessment and treatment plan with the patient. The patient was provided an opportunity to ask questions and all were answered. The patient agreed with the plan and demonstrated an understanding of the instructions.   The patient was advised to call back or seek an in-person evaluation if the symptoms worsen or if the condition fails to improve as anticipated.   Marinette MD OP Progress Note  09/07/2022 5:24 PM [name]  MRN:  [id number]  Chief Complaint:  Chief Complaint  Patient presents with   Follow-up   Anxiety   Medication Refill   HPI: [name] is a 71 year old Caucasian male, divorced, lives in [place], has a history of schizophrenia, GAD was evaluated by phone today.  Patient as well as sister-Mary participated in the evaluation today.  Patient today reports overall he is currently doing well.  Denies any significant anxiety, depression symptoms.  Denies any hallucinations, did not seem to be preoccupied with any delusions or paranoia.  Reports appetite is fair although he continues to crave sugary food.  Aware that he needs to make some diet changes.  Patient reports he is currently compliant on medications, denies side effects.  Reports sleep as good.  Currently denies any suicidality or homicidality.  Patient  denies any other concerns today.  Visit Diagnosis:    ICD-10-CM   1. Schizophrenia in full remission with history of multiple episodes (HCC)  F20.9 [name] (TRILEPTAL) 150 MG tablet    2. GAD (generalized anxiety disorder)  F41.1 [name] (PAXIL) 10 MG tablet    3. High risk medication use  Z79.899 [name] (ZYPREXA) 15 MG tablet      Past Psychiatric History: Reviewed past psychiatric history from progress note on 02/23/2018.  Past trials of Haldol, Klonopin, Seroquel.  Past Medical History:  Past Medical History:  Diagnosis Date   Chicken pox    Dizziness    Measles    Mumps    Schizophrenia ([place])     Past Surgical History:  Procedure Laterality Date   ABDOMINAL SURGERY     COLON SURGERY     LAPAROTOMY N/A 03/20/2016   Procedure: EXPLORATORY LAPAROTOMY with lysis of adhesions;  Surgeon: Jules Husbands, MD;  Location: [facility] ORS;  Service: General;  Laterality: N/A;   None      Family Psychiatric History: Reviewed family psychiatric history from progress note on 02/23/2018.  Family History:  Family History  Problem Relation Age of Onset   Diabetes Mother    Heart disease Mother    Stroke Mother    Diabetes Father    Hypertension Father    Lung cancer Father    Cancer Father        lung cancer   Schizophrenia Sister    Lung cancer Sister    Colon cancer Sister     Social History: Reviewed social history from progress note  on 02/23/2018. Social History   Socioeconomic History   Marital status: Divorced    Spouse name: Not on file   Number of children: 2   Years of education: Not on file   Highest education level: High school graduate  Occupational History   Occupation: Disabled  Tobacco Use   Smoking status: Former    Types: Cigarettes    Quit date: 08/04/2004    Years since quitting: 18.1   Smokeless tobacco: Former    Quit date: 08/04/2004  Vaping Use   Vaping Use: Never used  Substance and Sexual Activity   Alcohol use: No    Alcohol/week: 0.0  standard drinks of alcohol   Drug use: No   Sexual activity: Not Currently  Other Topics Concern   Not on file  Social History Narrative   Not on file   Social Determinants of Health   Financial Resource Strain: Low Risk  (03/10/2022)   Overall Financial Resource Strain (CARDIA)    Difficulty of Paying Living Expenses: Not hard at all  Food Insecurity: No [place] (03/10/2022)   Hunger Vital Sign    Worried About Running Out of Food in the Last Year: Never true    [place] in the Last Year: Never true  Transportation Needs: No Transportation Needs (03/10/2022)   PRAPARE - Hydrologist (Medical): No    Lack of Transportation (Non-Medical): No  Physical Activity: Insufficiently Active (03/10/2022)   Exercise Vital Sign    Days of Exercise per Week: 3 days    Minutes of Exercise per Session: 30 min  Stress: No Stress Concern Present (03/10/2022)   [place]    Feeling of Stress : Not at all  Social Connections: Moderately Isolated (03/10/2022)   Social Connection and Isolation Panel [NHANES]    Frequency of Communication with Friends and Family: Twice a week    Frequency of Social Gatherings with Friends and Family: Three times a week    Attends Religious Services: More than 4 times per year    Active Member of Clubs or Organizations: No    Attends Archivist Meetings: Never    Marital Status: Divorced    Allergies:  Allergies  Allergen Reactions   Vitamin B12 Other (See Comments)    interacted with other medications  interacted with other medications     Metabolic Disorder Labs: Lab Results  Component Value Date   HGBA1C 5.3 11/07/2020   MPG 105.41 11/07/2020   Lab Results  Component Value Date   PROLACTIN 17.3 (H) 11/07/2020   Lab Results  Component Value Date   CHOL 165 10/07/2020   TRIG 124 10/07/2020   HDL 38 (L) 10/07/2020   CHOLHDL 4.3  10/07/2020   LDLCALC 105 (H) 10/07/2020   LDLCALC 91 06/14/2019   Lab Results  Component Value Date   TSH 2.420 06/14/2019   TSH 1.333 05/04/2016    Therapeutic Level Labs: No results found for: "LITHIUM" No results found for: "VALPROATE" No results found for: "CBMZ"  Current Medications: Current Outpatient Medications  Medication Sig Dispense Refill   fludrocortisone (FLORINEF) 0.1 MG tablet TAKE 1 TABLET BY MOUTH TWICE A DAY 180 tablet 3   midodrine (PROAMATINE) 2.5 MG tablet TAKE ONE TABLET BY MOUTH THREE TIMES A DAY (Patient taking differently: Take 2.5 mg by mouth. Takes two times a day) 90 tablet 3   OVER THE COUNTER MEDICATION Rexall for constipation  pravastatin (PRAVACHOL) 40 MG tablet TAKE 1 TABLET BY MOUTH DAILY 90 tablet 4   Psyllium (METAMUCIL) 0.36 g CAPS Take by mouth.     risperiDONE (RISPERDAL) 2 MG tablet TAKE 1 TABLET BY MOUTH AT BEDTIME 90 tablet 0   donepezil (ARICEPT) 5 MG tablet Take by mouth.     [name] (ZYPREXA) 15 MG tablet Take 1 tablet (15 mg total) by mouth at bedtime. 90 tablet 0   [name] (TRILEPTAL) 150 MG tablet Take 1 tablet (150 mg total) by mouth 2 (two) times daily. 180 tablet 0   [name] (PAXIL) 10 MG tablet Take 1.5 tablets (15 mg total) by mouth daily. 135 tablet 0   No current facility-administered medications for this visit.     Musculoskeletal: Strength & Muscle Tone:  UTA Gait & Station:  UTA Patient leans: N/A  Psychiatric Specialty Exam: Review of Systems  Psychiatric/Behavioral: Negative.    All other systems reviewed and are negative.   There were no vitals taken for this visit.There is no height or weight on file to calculate BMI.  General Appearance:  UTA  Eye Contact:   UTA  Speech:  Normal Rate  Volume:  Normal  Mood:  Euthymic  Affect:   UTA  Thought Process:  Goal Directed and Descriptions of Associations: Intact  Orientation:  Full (Time, Place, and Person)  Thought Content: Logical   Suicidal  Thoughts:  No  Homicidal Thoughts:  No  Memory:  Immediate;   Fair Recent;   Fair Remote;   Limited  Judgement:  Fair  Insight:  Fair  Psychomotor Activity:   UTA  Concentration:  Concentration: Fair and Attention Span: Fair  Recall:  [employer] of Knowledge: Fair  Language: Fair  Akathisia:  No  Handed:  Right  AIMS (if indicated): not done  Assets:  Communication Skills Desire for Improvement Housing Social Support  ADL's:  Intact  Cognition: Baseline  Sleep:  Fair   Screenings: AIMS    Flowsheet Row Video Visit from 06/01/2022 in [place] Office Visit from 02/16/2022 in [place] Visit from 10/20/2021 in [place] Visit from 07/21/2021 in [place] Visit from 02/23/2018 in [place] Total Score 0 0 0 0 3      GAD-7    Flowsheet Row Video Visit from 06/01/2022 in [place] Visit from 04/21/2021 in [place]  Total GAD-7 Score 2 1      PHQ2-9    [place] Video Visit from 06/01/2022 in [place] from 03/10/2022 in [place] Visit from 02/16/2022 in [place] Visit from 10/20/2021 in [place] from 04/21/2021 in [place]  PHQ-2 Total Score 0 0 0 0 0  PHQ-9 Total Score -- -- 5 -- 5      Flowsheet Row Video Visit from 09/07/2022 in [place] Video Visit from 06/01/2022 in [place] Office Visit from 07/21/2021 in [place] No Risk No Risk No Risk        Assessment and Plan: [name] is a 71 year old Caucasian male, divorced, lives  in [place], has a history of schizophrenia, GAD was evaluated by phone today.  Patient is currently stable.  Plan Schizophrenia-stable Zyprexa 15 mg p.o. nightly. Risperidone 2 mg p.o.  nightly Trileptal 150 mg p.o. twice daily. Patient aware of long-term adverse side effects of being on 2 antipsychotic medications, previous attempts to taper him off to monotherapy was not successful.  GAD-stable Paxil 50 mg p.o. daily  High risk medication use-reviewed and discussed labs-dated 06/10/2022-CBC with differential-hemoglobin low at 12.6 otherwise within normal limits, CMP-BUN elevated, chronic, sodium level-within normal limits.  Collateral information obtained from sister as noted above.  Follow-up in clinic in 3 months or sooner in person.   I have spent at least 11 minutes non face to face with patient today.   Consent: Patient/Guardian gives verbal consent for treatment and assignment of benefits for services provided during this visit. Patient/Guardian expressed understanding and agreed to proceed.   This note was generated in part or whole with voice recognition software. Voice recognition is usually quite accurate but there are transcription errors that can and very often do occur. I apologize for any typographical errors that were not detected and corrected.      Ursula Alert, MD 09/08/2022, 9:04 AM

## 2022-10-07 DIAGNOSIS — Z961 Presence of intraocular lens: Secondary | ICD-10-CM | POA: Diagnosis not present

## 2022-10-07 DIAGNOSIS — H26492 Other secondary cataract, left eye: Secondary | ICD-10-CM | POA: Diagnosis not present

## 2022-10-07 DIAGNOSIS — H2511 Age-related nuclear cataract, right eye: Secondary | ICD-10-CM | POA: Diagnosis not present

## 2022-10-11 ENCOUNTER — Other Ambulatory Visit: Payer: Self-pay | Admitting: Family Medicine

## 2022-11-16 DIAGNOSIS — H2511 Age-related nuclear cataract, right eye: Secondary | ICD-10-CM | POA: Diagnosis not present

## 2022-11-19 ENCOUNTER — Encounter: Payer: Self-pay | Admitting: Ophthalmology

## 2022-11-24 NOTE — Discharge Instructions (Signed)

## 2022-11-26 ENCOUNTER — Other Ambulatory Visit: Payer: Self-pay | Admitting: Family Medicine

## 2022-11-26 ENCOUNTER — Ambulatory Visit: Payer: Medicare Other | Admitting: Anesthesiology

## 2022-11-26 ENCOUNTER — Other Ambulatory Visit: Payer: Self-pay

## 2022-11-26 ENCOUNTER — Ambulatory Visit
Admission: RE | Admit: 2022-11-26 | Discharge: 2022-11-26 | Disposition: A | Discharge status: Home / Self Care | Payer: Medicare Other | Attending: Ophthalmology | Admitting: Ophthalmology

## 2022-11-26 ENCOUNTER — Encounter
Admission: RE | Disposition: A | Discharge status: Home / Self Care | Payer: Self-pay | Source: Home / Self Care | Attending: Ophthalmology

## 2022-11-26 DIAGNOSIS — Z87891 Personal history of nicotine dependence: Secondary | ICD-10-CM | POA: Diagnosis not present

## 2022-11-26 DIAGNOSIS — H2511 Age-related nuclear cataract, right eye: Secondary | ICD-10-CM | POA: Diagnosis not present

## 2022-11-26 DIAGNOSIS — Z833 Family history of diabetes mellitus: Secondary | ICD-10-CM | POA: Insufficient documentation

## 2022-11-26 DIAGNOSIS — E785 Hyperlipidemia, unspecified: Secondary | ICD-10-CM | POA: Diagnosis not present

## 2022-11-26 DIAGNOSIS — I951 Orthostatic hypotension: Secondary | ICD-10-CM

## 2022-11-26 DIAGNOSIS — N183 Chronic kidney disease, stage 3 unspecified: Secondary | ICD-10-CM | POA: Diagnosis not present

## 2022-11-26 HISTORY — PX: CATARACT EXTRACTION W/PHACO: SHX586

## 2022-11-26 HISTORY — DX: Family history of other specified conditions: Z84.89

## 2022-11-26 HISTORY — DX: Other specified postprocedural states: Z98.890

## 2022-11-26 HISTORY — DX: Other specified postprocedural states: R11.2

## 2022-11-26 SURGERY — PHACOEMULSIFICATION, CATARACT, WITH IOL INSERTION
Anesthesia: Monitor Anesthesia Care | Site: Eye | Laterality: Right

## 2022-11-26 MED ORDER — BRIMONIDINE TARTRATE-TIMOLOL 0.2-0.5 % OP SOLN
OPHTHALMIC | Status: DC | PRN
  Administered 2022-11-26: 1 [drp] via OPHTHALMIC

## 2022-11-26 MED ORDER — SIGHTPATH DOSE#1 NA HYALUR & NA CHOND-NA HYALUR IO KIT
PACK | INTRAOCULAR | Status: DC | PRN
  Administered 2022-11-26: 1 via OPHTHALMIC

## 2022-11-26 MED ORDER — SIGHTPATH DOSE#1 BSS IO SOLN
INTRAOCULAR | Status: DC | PRN
  Administered 2022-11-26: 108 mL via OPHTHALMIC

## 2022-11-26 MED ORDER — SIGHTPATH DOSE#1 BSS IO SOLN
INTRAOCULAR | Status: DC | PRN
  Administered 2022-11-26: 15 mL

## 2022-11-26 MED ORDER — MIDAZOLAM HCL 2 MG/2ML IJ SOLN
INTRAMUSCULAR | Status: DC | PRN
  Administered 2022-11-26 (×2): 1 mg via INTRAVENOUS

## 2022-11-26 MED ORDER — MIDODRINE HCL 2.5 MG PO TABS: 2.5000 mg | ORAL_TABLET | Freq: Three times a day (TID) | ORAL | 0 refills | Status: DC

## 2022-11-26 MED ORDER — MOXIFLOXACIN HCL 0.5 % OP SOLN
OPHTHALMIC | Status: DC | PRN
  Administered 2022-11-26: .2 mL via OPHTHALMIC

## 2022-11-26 MED ORDER — TETRACAINE HCL 0.5 % OP SOLN
1.0000 [drp] | OPHTHALMIC | Status: DC | PRN
  Administered 2022-11-26 (×3): 1 [drp] via OPHTHALMIC

## 2022-11-26 MED ORDER — FENTANYL CITRATE (PF) 100 MCG/2ML IJ SOLN
INTRAMUSCULAR | Status: DC | PRN
  Administered 2022-11-26 (×2): 50 ug via INTRAVENOUS

## 2022-11-26 MED ORDER — ARMC OPHTHALMIC DILATING DROPS
1.0000 | OPHTHALMIC | Status: DC | PRN
  Administered 2022-11-26 (×3): 1 via OPHTHALMIC

## 2022-11-26 MED ORDER — LIDOCAINE HCL (PF) 2 % IJ SOLN
INTRAOCULAR | Status: DC | PRN
  Administered 2022-11-26: 1 mL via INTRAOCULAR

## 2022-11-26 SURGICAL SUPPLY — 22 items
CANNULA ANT/CHMB 27G (MISCELLANEOUS) IMPLANT
CANNULA ANT/CHMB 27GA (MISCELLANEOUS) IMPLANT
CATARACT SUITE SIGHTPATH (MISCELLANEOUS) ×1 IMPLANT
DISSECTOR HYDRO NUCLEUS 50X22 (MISCELLANEOUS) ×1 IMPLANT
DRSG TEGADERM 2-3/8X2-3/4 SM (GAUZE/BANDAGES/DRESSINGS) ×1 IMPLANT
FEE CATARACT SUITE SIGHTPATH (MISCELLANEOUS) ×1 IMPLANT
GLOVE SURG SYN 7.5  E (GLOVE) ×1
GLOVE SURG SYN 7.5 E (GLOVE) ×1 IMPLANT
GLOVE SURG SYN 7.5 PF PI (GLOVE) ×1 IMPLANT
GLOVE SURG SYN 8.5  E (GLOVE) ×1
GLOVE SURG SYN 8.5 E (GLOVE) ×1 IMPLANT
GLOVE SURG SYN 8.5 PF PI (GLOVE) ×1 IMPLANT
LENS IOL TECNIS EYHANCE 18.5 (Intraocular Lens) IMPLANT
NDL FILTER BLUNT 18X1 1/2 (NEEDLE) IMPLANT
NEEDLE FILTER BLUNT 18X1 1/2 (NEEDLE) IMPLANT
PACK VIT ANT 23G (MISCELLANEOUS) IMPLANT
RING MALYGIN (MISCELLANEOUS) IMPLANT
SUT ETHILON 10-0 CS-B-6CS-B-6 (SUTURE)
SUTURE EHLN 10-0 CS-B-6CS-B-6 (SUTURE) IMPLANT
SYR 3ML LL SCALE MARK (SYRINGE) IMPLANT
SYR 5ML LL (SYRINGE) IMPLANT
WATER STERILE IRR 250ML POUR (IV SOLUTION) ×1 IMPLANT

## 2022-11-26 NOTE — Op Note (Signed)
OPERATIVE NOTE  [name] [id number] 11/26/2022   PREOPERATIVE DIAGNOSIS: Nuclear sclerotic cataract right eye. H25.11   POSTOPERATIVE DIAGNOSIS: Nuclear sclerotic cataract right eye. H25.11   PROCEDURE:  Phacoemusification with posterior chamber intraocular lens placement of the right eye  Ultrasound time: Procedure(s) with comments: CATARACT EXTRACTION PHACO AND INTRAOCULAR LENS PLACEMENT (IOC) RIGHT (Right) - 24.46 2:03.1  LENS:   Implant Name Type Inv. Item Serial No. Manufacturer Lot No. LRB No. Used Action  LENS IOL TECNIS EYHANCE 18.5 - [id number] Intraocular Lens LENS IOL TECNIS EYHANCE 18.5 [id number] [employer]  Right 1 Implanted      SURGEON:  Courtney Heys. Lazarus Salines, MD   ANESTHESIA:  Topical with tetracaine drops, augmented with 1% preservative-free intracameral lidocaine.   COMPLICATIONS:  None.   DESCRIPTION OF PROCEDURE:  The patient was identified in the holding room and transported to the operating room and placed in the supine position under the operating microscope.  The right eye was identified as the operative eye, which was prepped and draped in the usual sterile ophthalmic fashion.   A 1 millimeter clear-corneal paracentesis was made superotemporally. Preservative-free 1% lidocaine mixed with 1:1,000 bisulfite-free aqueous solution of epinephrine was injected into the anterior chamber. The anterior chamber was then filled with Viscoat viscoelastic. A 2.4 millimeter keratome was used to make a clear-corneal incision inferotemporally. A curvilinear capsulorrhexis was made with a cystotome and capsulorrhexis forceps. Balanced salt solution was used to hydrodissect and hydrodelineate the nucleus. Phacoemulsification was then used to remove the lens nucleus and epinucleus. The remaining cortex was then removed using the irrigation and aspiration handpiece. Provisc was then placed into the capsular bag to distend it for lens placement. A +18.50 D DIB00 intraocular lens  was then injected into the capsular bag. The remaining viscoelastic was aspirated.   Wounds were hydrated with balanced salt solution.  The anterior chamber was inflated to a physiologic pressure with balanced salt solution.  No wound leaks were noted. Vigamox was injected intracamerally.  Timolol and Brimonidine drops were applied to the eye.  The patient was taken to the recovery room in stable condition without complications of anesthesia or surgery.  [name] 11/26/2022, 8:10 AM

## 2022-11-26 NOTE — Anesthesia Preprocedure Evaluation (Signed)
Anesthesia Evaluation  Patient identified by MRN, date of birth, ID band Patient awake    Reviewed: Allergy & Precautions, NPO status , Patient's Chart, lab work & pertinent test results  History of Anesthesia Complications (+) PONV and history of anesthetic complications  Airway Mallampati: II  TM Distance: >3 FB Neck ROM: full    Dental  (+) Upper Dentures, Poor Dentition   Pulmonary neg pulmonary ROS, former smoker   Pulmonary exam normal        Cardiovascular negative cardio ROS Normal cardiovascular exam  Hypotension on midodrine and steroid    Neuro/Psych  PSYCHIATRIC DISORDERS    Schizophrenia  negative neurological ROS     GI/Hepatic negative GI ROS, Neg liver ROS,,,  Endo/Other  negative endocrine ROS    Renal/GU      Musculoskeletal   Abdominal   Peds  Hematology negative hematology ROS (+)   Anesthesia Other Findings Past Medical History: No date: Chicken pox No date: Dizziness No date: Family history of adverse reaction to anesthesia No date: Measles No date: Mumps No date: PONV (postoperative nausea and vomiting) No date: Schizophrenia [facility])  Past Surgical History: No date: ABDOMINAL SURGERY No date: COLON SURGERY 03/20/2016: LAPAROTOMY; N/A     Comment:  Procedure: EXPLORATORY LAPAROTOMY with lysis of               adhesions;  Surgeon: Jules Husbands, MD;  Location: [facility]               ORS;  Service: General;  Laterality: N/A; No date: None  BMI    Body Mass Index: 33.23 kg/m      Reproductive/Obstetrics negative OB ROS                             Anesthesia Physical Anesthesia Plan  ASA: 2  Anesthesia Plan: MAC   Post-op Pain Management: Minimal or no pain anticipated   Induction: Intravenous  PONV Risk Score and Plan:   Airway Management Planned: Natural Airway and Nasal Cannula  Additional Equipment:   Intra-op Plan:   Post-operative Plan:    Informed Consent: I have reviewed the patients History and Physical, chart, labs and discussed the procedure including the risks, benefits and alternatives for the proposed anesthesia with the patient or authorized representative who has indicated his/her understanding and acceptance.     Dental Advisory Given  Plan Discussed with: Anesthesiologist, CRNA and Surgeon  Anesthesia Plan Comments: (Patient consented for risks of anesthesia including but not limited to:  - adverse reactions to medications - damage to eyes, teeth, lips or other oral mucosa - nerve damage due to positioning  - sore throat or hoarseness - Damage to heart, brain, nerves, lungs, other parts of body or loss of life  Patient voiced understanding.)       Anesthesia Quick Evaluation

## 2022-11-26 NOTE — Transfer of Care (Signed)
Immediate Anesthesia Transfer of Care Note  Patient: [name]  Procedure(s) Performed: CATARACT EXTRACTION PHACO AND INTRAOCULAR LENS PLACEMENT (IOC) RIGHT (Right: Eye)  Patient Location: PACU  Anesthesia Type: MAC  Level of Consciousness: awake, alert  and patient cooperative  Airway and Oxygen Therapy: Patient Spontanous Breathing and Patient connected to supplemental oxygen  Post-op Assessment: Post-op Vital signs reviewed, Patient's Cardiovascular Status Stable, Respiratory Function Stable, Patent Airway and No signs of Nausea or vomiting  Post-op Vital Signs: Reviewed and stable  Complications: No notable events documented.

## 2022-11-26 NOTE — H&P (Signed)
[facility]   Primary Care Physician:  Birdie Sons, MD Ophthalmologist: Dr. Merleen Nicely  Pre-Procedure History & Physical: HPI:  [name] is a 71 y.o. male here for cataract surgery.   Past Medical History:  Diagnosis Date   Chicken pox    Dizziness    Family history of adverse reaction to anesthesia    Measles    Mumps    PONV (postoperative nausea and vomiting)    Schizophrenia ([place])     Past Surgical History:  Procedure Laterality Date   ABDOMINAL SURGERY     COLON SURGERY     LAPAROTOMY N/A 03/20/2016   Procedure: EXPLORATORY LAPAROTOMY with lysis of adhesions;  Surgeon: Jules Husbands, MD;  Location: [facility] ORS;  Service: General;  Laterality: N/A;   None      Prior to Admission medications   Medication Sig Start Date End Date Taking? Authorizing Provider  fludrocortisone (FLORINEF) 0.1 MG tablet TAKE 1 TABLET BY MOUTH TWICE A DAY 08/24/22  Yes Birdie Sons, MD  midodrine (PROAMATINE) 2.5 MG tablet TAKE ONE TABLET BY MOUTH THREE TIMES A DAY Patient taking differently: Take 2.5 mg by mouth. Takes two times a day 05/11/22  Yes Fisher, Kirstie Peri, MD  OLANZapine (ZYPREXA) 15 MG tablet Take 1 tablet (15 mg total) by mouth at bedtime. 09/07/22  Yes Ursula Alert, MD  OVER THE COUNTER MEDICATION Rexall for constipation   Yes [provider]  OXcarbazepine (TRILEPTAL) 150 MG tablet Take 1 tablet (150 mg total) by mouth 2 (two) times daily. 09/07/22  Yes Ursula Alert, MD  PARoxetine (PAXIL) 10 MG tablet Take 1.5 tablets (15 mg total) by mouth daily. 09/07/22  Yes Ursula Alert, MD  pravastatin (PRAVACHOL) 40 MG tablet TAKE 1 TABLET BY MOUTH DAILY 10/11/22  Yes Birdie Sons, MD  risperiDONE (RISPERDAL) 2 MG tablet TAKE 1 TABLET BY MOUTH AT BEDTIME 08/31/22  Yes Eappen, Ria Clock, MD  donepezil (ARICEPT) 5 MG tablet Take by mouth. 11/21/20 06/01/22  [provider]  Psyllium (METAMUCIL) 0.36 g CAPS Take by mouth. Patient not taking: Reported  on 11/19/2022    [provider]    Allergies as of 11/03/2022 - Review Complete 09/07/2022  Allergen Reaction Noted   Vitamin b12 Other (See Comments) 08/20/2014    Family History  Problem Relation Age of Onset   Diabetes Mother    Heart disease Mother    Stroke Mother    Diabetes Father    Hypertension Father    Lung cancer Father    Cancer Father        lung cancer   Schizophrenia Sister    Lung cancer Sister    Colon cancer Sister     Social History   Socioeconomic History   Marital status: Divorced    Spouse name: Not on file   Number of children: 2   Years of education: Not on file   Highest education level: High school graduate  Occupational History   Occupation: Disabled  Tobacco Use   Smoking status: Former    Types: Cigarettes    Quit date: 08/04/2004    Years since quitting: 18.3   Smokeless tobacco: Former    Quit date: 08/04/2004  Vaping Use   Vaping Use: Never used  Substance and Sexual Activity   Alcohol use: No    Alcohol/week: 0.0 standard drinks of alcohol   Drug use: No   Sexual activity: Not Currently  Other Topics Concern  Not on file  Social History Narrative   Not on file   Social Determinants of Health   Financial Resource Strain: Low Risk  (03/10/2022)   Overall Financial Resource Strain (CARDIA)    Difficulty of Paying Living Expenses: Not hard at all  Food Insecurity: No Food Insecurity (03/10/2022)   Hunger Vital Sign    Worried About Running Out of Food in the Last Year: Never true    Ran Out of Food in the Last Year: Never true  Transportation Needs: No Transportation Needs (03/10/2022)   PRAPARE - Hydrologist (Medical): No    Lack of Transportation (Non-Medical): No  Physical Activity: Insufficiently Active (03/10/2022)   Exercise Vital Sign    Days of Exercise per Week: 3 days    Minutes of Exercise per Session: 30 min  Stress: No Stress Concern Present (03/10/2022)   [place]    Feeling of Stress : Not at all  Social Connections: Moderately Isolated (03/10/2022)   Social Connection and Isolation Panel [NHANES]    Frequency of Communication with Friends and Family: Twice a week    Frequency of Social Gatherings with Friends and Family: Three times a week    Attends Religious Services: More than 4 times per year    Active Member of Clubs or Organizations: No    Attends Archivist Meetings: Never    Marital Status: Divorced  Human resources officer Violence: Not At Risk (03/10/2022)   Humiliation, Afraid, Rape, and Kick questionnaire    Fear of Current or Ex-Partner: No    Emotionally Abused: No    Physically Abused: No    Sexually Abused: No    Review of Systems: See HPI, otherwise negative ROS  Physical Exam: BP (!) 158/82   Pulse 65   Temp 98.1 F (36.7 C) (Temporal)   Resp 20   Ht 6' (1.829 m)   Wt 111.1 kg   SpO2 98%   BMI 33.23 kg/m  General:   Alert, cooperative in NAD Head:  Normocephalic and atraumatic. Respiratory:  Normal work of breathing. Cardiovascular:  RRR  Impression/Plan: [name] is here for cataract surgery.  Risks, benefits, limitations, and alternatives regarding cataract surgery have been reviewed with the patient.  Questions have been answered.  All parties agreeable.   Norvel Richards, MD  11/26/2022, 7:04 AM

## 2022-11-26 NOTE — Anesthesia Postprocedure Evaluation (Signed)
Anesthesia Post Note  Patient: [name]  Procedure(s) Performed: CATARACT EXTRACTION PHACO AND INTRAOCULAR LENS PLACEMENT (IOC) RIGHT (Right: Eye)  Patient location during evaluation: PACU Anesthesia Type: MAC Level of consciousness: awake and alert Pain management: pain level controlled Vital Signs Assessment: post-procedure vital signs reviewed and stable Respiratory status: spontaneous breathing, nonlabored ventilation, respiratory function stable and patient connected to nasal cannula oxygen Cardiovascular status: stable and blood pressure returned to baseline Postop Assessment: no apparent nausea or vomiting Anesthetic complications: no   No notable events documented.   Last Vitals:  Vitals:   11/26/22 0812 11/26/22 0815  BP: (!) 161/74 (!) 163/85  Pulse: 67 60  Resp: 16 17  Temp: 36.8 C   SpO2: 98% 96%    Last Pain:  Vitals:   11/26/22 0815  TempSrc:   PainSc: 0-No pain                 Ilene Qua

## 2022-11-26 NOTE — Telephone Encounter (Signed)
Refill request was denied stating pt was given refill for 90 tab and 3 refills on 5.30.23 /but pt takes 3 a day so 90 tabs are just a month supply / pt is out of medication please send to [place], [place]

## 2022-11-26 NOTE — Addendum Note (Signed)
Addended by: Elliot Cousin on: 11/26/2022 02:20 PM   Modules accepted: Orders

## 2022-11-26 NOTE — Telephone Encounter (Signed)
Requested medication (s) are due for refill today: yes  Requested medication (s) are on the active medication list: yes    Last refill: 05/11/22  #90  3 refills  Future visit scheduled no  Notes to clinic:Refused earlier "Early request"  Pt takes 3/day   Requested Prescriptions  Pending Prescriptions Disp Refills   midodrine (PROAMATINE) 2.5 MG tablet 90 tablet 3    Sig: Take 1 tablet (2.5 mg total) by mouth 3 (three) times daily.     Not Delegated - Cardiovascular: Midodrine Failed - 11/26/2022  2:20 PM      Failed - This refill cannot be delegated      Failed - Cr in normal range and within 360 days    Creatinine, Ser  Date Value Ref Range Status  06/10/2022 1.32 (H) 0.76 - 1.27 mg/dL Final         Failed - Last BP in normal range    BP Readings from Last 1 Encounters:  11/26/22 (!) 163/85         Passed - ALT in normal range and within 360 days    ALT  Date Value Ref Range Status  06/10/2022 23 0 - 44 IU/L Final         Passed - AST in normal range and within 360 days    AST  Date Value Ref Range Status  06/10/2022 22 0 - 40 IU/L Final         Passed - Valid encounter within last 12 months    Recent Outpatient Visits           5 months ago Orthostatic hypotension   [employer], [place], PA-C   1 year ago Stage 3a chronic kidney disease ([place])   [place], Kirstie Peri, MD   1 year ago High risk medication use   [facility] Birdie Sons, MD   2 years ago Annual physical exam   [facility] Birdie Sons, MD   2 years ago [place], Kirstie Peri, MD              Refused Prescriptions Disp Refills   midodrine (PROAMATINE) 2.5 MG tablet [Pharmacy Med Name: MIDODRINE HCL 2.5 MG TAB] 90 tablet 3    Sig: TAKE ONE TABLET BY MOUTH THREE TIMES A DAY     Not Delegated - Cardiovascular: Midodrine Failed - 11/26/2022  2:20 PM      Failed - This  refill cannot be delegated      Failed - Cr in normal range and within 360 days    Creatinine, Ser  Date Value Ref Range Status  06/10/2022 1.32 (H) 0.76 - 1.27 mg/dL Final         Failed - Last BP in normal range    BP Readings from Last 1 Encounters:  11/26/22 (!) 163/85         Passed - ALT in normal range and within 360 days    ALT  Date Value Ref Range Status  06/10/2022 23 0 - 44 IU/L Final         Passed - AST in normal range and within 360 days    AST  Date Value Ref Range Status  06/10/2022 22 0 - 40 IU/L Final         Passed - Valid encounter within last 12 months    Recent Outpatient Visits  5 months ago Orthostatic hypotension   [facility] [place], [place], PA-C   1 year ago Stage 3a chronic kidney disease [facility])   [facility] Birdie Sons, MD   1 year ago High risk medication use   [facility] Birdie Sons, MD   2 years ago Annual physical exam   [facility] Birdie Sons, MD   2 years ago [place], Donald E, MD

## 2022-11-30 ENCOUNTER — Ambulatory Visit (INDEPENDENT_AMBULATORY_CARE_PROVIDER_SITE_OTHER): Payer: Medicare Other | Admitting: Psychiatry

## 2022-11-30 ENCOUNTER — Encounter: Payer: Self-pay | Admitting: Ophthalmology

## 2022-11-30 VITALS — BP 178/80 | HR 63 | Ht 72.0 in | Wt 248.0 lb

## 2022-11-30 DIAGNOSIS — Z79899 Other long term (current) drug therapy: Secondary | ICD-10-CM | POA: Diagnosis not present

## 2022-11-30 DIAGNOSIS — F411 Generalized anxiety disorder: Secondary | ICD-10-CM

## 2022-11-30 DIAGNOSIS — F209 Schizophrenia, unspecified: Secondary | ICD-10-CM | POA: Diagnosis not present

## 2022-11-30 MED ORDER — RISPERIDONE 2 MG PO TABS: 2.0000 mg | ORAL_TABLET | Freq: Every day | ORAL | 0 refills | Status: DC

## 2022-11-30 MED ORDER — OLANZAPINE 10 MG PO TABS: 10.0000 mg | ORAL_TABLET | Freq: Every day | ORAL | 0 refills | Status: DC

## 2022-11-30 MED ORDER — PAROXETINE HCL 10 MG PO TABS: 15.0000 mg | ORAL_TABLET | Freq: Every day | ORAL | 0 refills | Status: DC

## 2022-11-30 MED ORDER — OXCARBAZEPINE 150 MG PO TABS: 150.0000 mg | ORAL_TABLET | Freq: Two times a day (BID) | ORAL | 0 refills | Status: DC

## 2022-11-30 NOTE — Progress Notes (Unsigned)
[place] MD OP Progress Note  11/30/2022 2:55 PM [name]  MRN:  [id number]  Chief Complaint:  Chief Complaint  Patient presents with   Follow-up   Medication Refill   Schizophrenia   Anxiety   HPI: [name] is a 71 year old Caucasian male, divorced, lives in [place], has a history of schizophrenia, GAD was evaluated in office today.  Patient as well as sisterStanton [name] participated in the evaluation today.  Sister provided collateral information.  Patient appeared to be pleasant, cheerful.  Alert, oriented to person place time and situation.  Was able to do subtraction well, attention and focus seem to be good.  His recall seem to be good.  Per sister patient overall is doing fairly well on the current medication regimen.  Denies any significant mood swings.  Reports sleep is good.  Patient denies any side effects to medications.  Currently compliant on medications.  Denies any suicidality, homicidality or perceptual disturbances.  Patient with significantly elevated blood pressure reading in session today.  Per sister patient has been eating a lot of high-fat food including pizza as regularly.  That likely contributing to his elevated blood pressure reading.  He is also not exercising and does not move around a lot.  Patient also with weight gain likely due to being on multiple medications including olanzapine.  Agreeable to reduce the dosage of olanzapine with plan to taper it off as needed in the future.  Patient denies any other concerns today.  Visit Diagnosis:    ICD-10-CM   1. Schizophrenia in full remission with history of multiple episodes (HCC)  F20.9 Platelet count    Hepatic function panel    BUN+Creat    Hemoglobin A1C    OLANZapine (ZYPREXA) 10 MG tablet    OXcarbazepine (TRILEPTAL) 150 MG tablet    2. GAD (generalized anxiety disorder)  F41.1 Platelet count    Hepatic function panel    BUN+Creat    Hemoglobin A1C    OLANZapine (ZYPREXA) 10 MG tablet     PARoxetine (PAXIL) 10 MG tablet    risperiDONE (RISPERDAL) 2 MG tablet    3. High risk medication use  Z79.899 Platelet count    Hepatic function panel    BUN+Creat    Lipid panel    Hemoglobin A1C    Prolactin    TSH      Past Psychiatric History: Reviewed past psychiatric history from progress note on 02/23/2018.  Past trials of Haldol, Klonopin, Seroquel.  Past Medical History:  Past Medical History:  Diagnosis Date   Chicken pox    Dizziness    Family history of adverse reaction to anesthesia    Measles    Mumps    PONV (postoperative nausea and vomiting)    Schizophrenia ([place])     Past Surgical History:  Procedure Laterality Date   ABDOMINAL SURGERY     CATARACT EXTRACTION W/PHACO Right 11/26/2022   Procedure: CATARACT EXTRACTION PHACO AND INTRAOCULAR LENS PLACEMENT ([place]) RIGHT;  Surgeon: Norvel Richards, MD;  Location: [place];  Service: Ophthalmology;  Laterality: Right;  24.46 2:03.1   COLON SURGERY     LAPAROTOMY N/A 03/20/2016   Procedure: EXPLORATORY LAPAROTOMY with lysis of adhesions;  Surgeon: Jules Husbands, MD;  Location: [facility] ORS;  Service: General;  Laterality: N/A;   None      Family Psychiatric History: Reviewed family psychiatric history from progress note on 02/23/2018.  Family History:  Family History  Problem Relation Age of  Onset   Diabetes Mother    Heart disease Mother    Stroke Mother    Diabetes Father    Hypertension Father    Lung cancer Father    Cancer Father        lung cancer   Schizophrenia Sister    Lung cancer Sister    Colon cancer Sister     Social History: Reviewed social history from progress note on 02/23/2018. Social History   Socioeconomic History   Marital status: Divorced    Spouse name: Not on file   Number of children: 2   Years of education: Not on file   Highest education level: High school graduate  Occupational History   Occupation: Disabled  Tobacco Use   Smoking status: Former     Types: Cigarettes    Quit date: 08/04/2004    Years since quitting: 18.3   Smokeless tobacco: Former    Quit date: 08/04/2004  Vaping Use   Vaping Use: Never used  Substance and Sexual Activity   Alcohol use: No    Alcohol/week: 0.0 standard drinks of alcohol   Drug use: No   Sexual activity: Not Currently  Other Topics Concern   Not on file  Social History Narrative   Not on file   Social Determinants of Health   Financial Resource Strain: Low Risk  (03/10/2022)   Overall Financial Resource Strain (CARDIA)    Difficulty of Paying Living Expenses: Not hard at all  Food Insecurity: No [place] (03/10/2022)   Hunger Vital Sign    Worried About Running Out of Food in the Last Year: Never true    [place] in the Last Year: Never true  Transportation Needs: No Transportation Needs (03/10/2022)   PRAPARE - Hydrologist (Medical): No    Lack of Transportation (Non-Medical): No  Physical Activity: Insufficiently Active (03/10/2022)   Exercise Vital Sign    Days of Exercise per Week: 3 days    Minutes of Exercise per Session: 30 min  Stress: No Stress Concern Present (03/10/2022)   [place]    Feeling of Stress : Not at all  Social Connections: Moderately Isolated (03/10/2022)   Social Connection and Isolation Panel [NHANES]    Frequency of Communication with Friends and Family: Twice a week    Frequency of Social Gatherings with Friends and Family: Three times a week    Attends Religious Services: More than 4 times per year    Active Member of Clubs or Organizations: No    Attends Archivist Meetings: Never    Marital Status: Divorced    Allergies:  Allergies  Allergen Reactions   Vitamin B12 Other (See Comments)    interacted with other medications  interacted with other medications     Metabolic Disorder Labs: Lab Results  Component Value Date   HGBA1C  5.3 11/07/2020   MPG 105.41 11/07/2020   Lab Results  Component Value Date   PROLACTIN 17.3 (H) 11/07/2020   Lab Results  Component Value Date   CHOL 165 10/07/2020   TRIG 124 10/07/2020   HDL 38 (L) 10/07/2020   CHOLHDL 4.3 10/07/2020   LDLCALC 105 (H) 10/07/2020   LDLCALC 91 06/14/2019   Lab Results  Component Value Date   TSH 2.420 06/14/2019   TSH 1.333 05/04/2016    Therapeutic Level Labs: No results found for: "LITHIUM" No results found for: "VALPROATE" No  results found for: "CBMZ"  Current Medications: Current Outpatient Medications  Medication Sig Dispense Refill   fludrocortisone (FLORINEF) 0.1 MG tablet TAKE 1 TABLET BY MOUTH TWICE A DAY 180 tablet 3   midodrine (PROAMATINE) 2.5 MG tablet Take 1 tablet (2.5 mg total) by mouth 3 (three) times daily. 90 tablet 0   OLANZapine (ZYPREXA) 10 MG tablet Take 1 tablet (10 mg total) by mouth at bedtime. 90 tablet 0   OVER THE COUNTER MEDICATION Rexall for constipation     pravastatin (PRAVACHOL) 40 MG tablet TAKE 1 TABLET BY MOUTH DAILY 90 tablet 4   Psyllium (METAMUCIL) 0.36 g CAPS Take by mouth.     donepezil (ARICEPT) 5 MG tablet Take by mouth.     OXcarbazepine (TRILEPTAL) 150 MG tablet Take 1 tablet (150 mg total) by mouth 2 (two) times daily. 180 tablet 0   PARoxetine (PAXIL) 10 MG tablet Take 1.5 tablets (15 mg total) by mouth daily. 135 tablet 0   risperiDONE (RISPERDAL) 2 MG tablet Take 1 tablet (2 mg total) by mouth at bedtime. 90 tablet 0   No current facility-administered medications for this visit.     Musculoskeletal: Strength & Muscle Tone: within normal limits Gait & Station: normal Patient leans: N/A  Psychiatric Specialty Exam: Review of Systems  Psychiatric/Behavioral: Negative.    All other systems reviewed and are negative.   Blood pressure (!) 193/91, pulse 62, height 6' (1.829 m), weight 248 lb (112.5 kg).Body mass index is 33.63 kg/m.  General Appearance: Casual  Eye Contact:  Fair   Speech:  Normal Rate  Volume:  Normal  Mood:  Euthymic  Affect:  Congruent  Thought Process:  Goal Directed and Descriptions of Associations: Intact  Orientation:  Full (Time, Place, and Person)  Thought Content: WDL   Suicidal Thoughts:  No  Homicidal Thoughts:  No  Memory:  Immediate;   Fair Recent;   Fair Remote;   Poor  Judgement:  Fair  Insight:  Shallow  Psychomotor Activity:  Normal  Concentration:  Concentration: Fair and Attention Span: Fair  Recall:  [employer] of Knowledge: Fair  Language: Fair  Akathisia:  No  Handed:  Right  AIMS (if indicated): done  Assets:  Communication Skills Desire for Improvement Housing Social Support Transportation  ADL's:  Intact  Cognition: baseline  Sleep:  Fair   Screenings: Mariano Colon Video Visit from 06/01/2022 in [place] Office Visit from 02/16/2022 in [place] Visit from 10/20/2021 in [place] Visit from 07/21/2021 in [place] from 02/23/2018 in [place] Total Score 0 0 0 0 3      [place] Visit from 11/30/2022 in [place] Video Visit from 06/01/2022 in [place] Visit from 04/21/2021 in [place]  Total GAD-7 Score 0 2 1      PHQ2-9    [place] Visit from 11/30/2022 in [place] Video Visit from 06/01/2022 in [place] from 03/10/2022 in [place] Visit from 02/16/2022 in [place] Visit from 10/20/2021 in [place]  PHQ-2 Total Score 0 0 0 0 0  PHQ-9 Total Score 0 -- -- 5 --      Flowsheet Row Admission (Discharged) from  11/26/2022 in [facility] Video Visit from 09/07/2022 in [place] Video Visit from 06/01/2022 in [place] No Risk No Risk No Risk        Assessment and Plan: DAMIAN BUCKLES is a 71 year old Caucasian male, divorced, lives in [place], has a history of schizophrenia, GAD was evaluated in office today.  Patient is currently stable, agreeable to dosage reduction of olanzapine to address long-term adverse side effects including weight gain.  Plan as noted below.  Plan Schizophrenia-stable Reduce olanzapine to 10 mg p.o. nightly Risperidone 2 mg p.o. nightly Trileptal 150 mg p.o. twice daily Patient is aware of long-term adverse side effects of being on 2 antipsychotic medications.  Previous attempts to taper him off to monotherapy was not successful.  Will try again.  GAD-stable Paxil 50 mg p.o. daily  High risk medication use-will order TSH, prolactin, hemoglobin A1c, lipid panel, BUN/creatinine, hepatic function panel, platelet count-patient on medications like olanzapine, risperidone as well as oxcarbazepine and will need to monitor these labs.  Patient also with a history of anxiety, psychosis, will need to monitor his TSH level.  [facility]  Collateral information obtained from sister as noted above.  Discussed lifestyle modification, diet management.  Follow-up in clinic in 6 months or sooner if needed.   This note was generated in part or whole with voice recognition software. Voice recognition is usually quite accurate but there are transcription errors that can and very often do occur. I apologize for any typographical errors that were not detected and corrected.      Ursula Alert, MD 11/30/2022, 2:55 PM

## 2022-12-02 ENCOUNTER — Ambulatory Visit: Payer: Medicare Other | Admitting: Psychiatry

## 2023-01-19 ENCOUNTER — Other Ambulatory Visit: Payer: Self-pay | Admitting: Family Medicine

## 2023-01-19 DIAGNOSIS — I951 Orthostatic hypotension: Secondary | ICD-10-CM

## 2023-01-19 MED ORDER — MIDODRINE HCL 2.5 MG PO TABS: 2.5000 mg | ORAL_TABLET | Freq: Three times a day (TID) | ORAL | 0 refills | Status: DC

## 2023-01-19 NOTE — Telephone Encounter (Signed)
Medication Refill - Medication: midodrine (PROAMATINE) 2.5 MG tablet   Has the patient contacted their pharmacy? Yes.     Preferred Pharmacy (with phone number or street name):  [place], [place] Phone: [phone]  Fax: [phone]     Has the patient been seen for an appointment in the last year OR does the patient have an upcoming appointment? Yes.     The patient states he is out of his medicine and thought the pharmacy contacted his provider. Please assist patient further

## 2023-01-19 NOTE — Telephone Encounter (Signed)
Requested medication (s) are due for refill today: yes  Requested medication (s) are on the active medication list: yes  Last refill:  11/26/22  Future visit scheduled: no  Notes to clinic:  Unable to refill per protocol, cannot delegate.      Requested Prescriptions  Pending Prescriptions Disp Refills   midodrine (PROAMATINE) 2.5 MG tablet 90 tablet 0    Sig: Take 1 tablet (2.5 mg total) by mouth 3 (three) times daily.     Not Delegated - Cardiovascular: Midodrine Failed - 01/19/2023 11:23 AM      Failed - This refill cannot be delegated      Failed - Cr in normal range and within 360 days    Creatinine, Ser  Date Value Ref Range Status  06/10/2022 1.32 (H) 0.76 - 1.27 mg/dL Final         Failed - Last BP in normal range    BP Readings from Last 1 Encounters:  11/26/22 (!) 163/85         Passed - ALT in normal range and within 360 days    ALT  Date Value Ref Range Status  06/10/2022 23 0 - 44 IU/L Final         Passed - AST in normal range and within 360 days    AST  Date Value Ref Range Status  06/10/2022 22 0 - 40 IU/L Final         Passed - Valid encounter within last 12 months    Recent Outpatient Visits           7 months ago Orthostatic hypotension   [place], [place], PA-C   1 year ago Stage 3a chronic kidney disease [facility])   [facility] Birdie Sons, MD   1 year ago High risk medication use   [facility] Birdie Sons, MD   2 years ago Annual physical exam   [facility] Birdie Sons, MD   2 years ago [place], Donald E, MD

## 2023-02-11 ENCOUNTER — Ambulatory Visit: Payer: 59 | Admitting: Family Medicine

## 2023-02-21 ENCOUNTER — Ambulatory Visit: Payer: 59 | Admitting: Family Medicine

## 2023-03-07 ENCOUNTER — Ambulatory Visit: Payer: 59 | Admitting: Family Medicine

## 2023-03-07 ENCOUNTER — Other Ambulatory Visit: Payer: Self-pay | Admitting: Psychiatry

## 2023-03-07 DIAGNOSIS — F411 Generalized anxiety disorder: Secondary | ICD-10-CM

## 2023-03-07 DIAGNOSIS — F209 Schizophrenia, unspecified: Secondary | ICD-10-CM

## 2023-03-14 ENCOUNTER — Other Ambulatory Visit: Payer: Self-pay | Admitting: Psychiatry

## 2023-03-14 DIAGNOSIS — F411 Generalized anxiety disorder: Secondary | ICD-10-CM

## 2023-03-16 NOTE — Progress Notes (Signed)
Established patient visit   Patient: [name]   DOB: 10-22-1951   72 y.o. Male  MRN: [id number] Visit Date: 03/18/2023  Today's healthcare provider: Mila Merry, MD    Subjective    HPI  Patient is a 72 year old male who presents for follow up of chronic health issues. He was last seen on 06/10/22.   Chronic Kidney Disease.    BP Readings from Last 3 Encounters:  11/26/22 (!) 163/85  06/10/22 126/72  05/06/21 (!) 161/88    Lab Results  Component Value Date   CREATININE 1.32 (H) 06/10/2022   BUN 11 06/10/2022   NA 136 06/10/2022   K 4.6 06/10/2022   CL 97 06/10/2022   CO2 25 06/10/2022    He has history of orthostatic hypotension currently fludrocortisone  and midodrine twice a day   Medications: Outpatient Medications Prior to Visit  Medication Sig   donepezil (ARICEPT) 5 MG tablet Take by mouth.   fludrocortisone (FLORINEF) 0.1 MG tablet TAKE 1 TABLET BY MOUTH TWICE A DAY   midodrine (PROAMATINE) 2.5 MG tablet Take 1 tablet (2.5 mg total) by mouth 2 times daily.   OLANZapine (ZYPREXA) 10 MG tablet TAKE 1 TABLET BY MOUTH AT BEDTIME   OVER THE COUNTER MEDICATION Rexall for constipation   OXcarbazepine (TRILEPTAL) 150 MG tablet Take 1 tablet (150 mg total) by mouth 2 (two) times daily.   PARoxetine (PAXIL) 10 MG tablet Take 1.5 tablets (15 mg total) by mouth daily.   pravastatin (PRAVACHOL) 40 MG tablet TAKE 1 TABLET BY MOUTH DAILY   Psyllium (METAMUCIL) 0.36 g CAPS Take by mouth.   risperiDONE (RISPERDAL) 2 MG tablet TAKE 1 TABLET BY MOUTH AT BEDTIME   No facility-administered medications prior to visit.    Review of Systems  Constitutional:  Negative for appetite change, chills and fever.  Respiratory:  Negative for chest tightness, shortness of breath and wheezing.   Cardiovascular:  Negative for chest pain and palpitations.  Gastrointestinal:  Negative for abdominal pain, nausea and vomiting.       Objective    BP (!) 184/96 (BP Location:  Left Arm, Patient Position: Sitting, Cuff Size: Large)   Pulse (!) 58   Wt 245 lb 11.2 oz (111.4 kg)   SpO2 100%   BMI 33.32 kg/m    Physical Exam   General: Appearance:    Mildly obese male in no acute distress  Eyes:    PERRL, conjunctiva/corneas clear, EOM's intact       Lungs:     Clear to auscultation bilaterally, respirations unlabored  Heart:    Bradycardic. Normal rhythm. No murmurs, rubs, or gallops.    MS:   All extremities are intact.    Neurologic:   Awake, alert, oriented x 3. No apparent focal neurological defect.         Assessment & Plan     1. Orthostatic hypotension Currently taking midodrine and fludrocortisone twice a day. Will reduce fludrocortisone to once a day as below.   2. Elevated blood pressure reading He admits to consuming a lot of salt in diet and advised to cut back as much as possible. Will reduce fludrocortisone from BID to QD and follow up in 1 month to check blood pressure.   3. Stage 3a chronic kidney disease - Comprehensive metabolic panel  4. Hyperlipidemia, unspecified hyperlipidemia type He is tolerating pravastatin well with no adverse effects.   - CBC - Lipid panel  5. Hyponatremia  -  TSH  6. Schizophrenia, simple, chronic (HCC) Followed by Dr. Laurence Spates - Hemoglobin A1c - Prolactin      The entirety of the information documented in the History of Present Illness, Review of Systems and Physical Exam were personally obtained by me. Portions of this information were initially documented by the CMA and reviewed by me for thoroughness and accuracy.     Mila Merry, MD  [facility] [phone] (phone) [phone] (fax)  [facility]

## 2023-03-18 ENCOUNTER — Encounter: Payer: Self-pay | Admitting: Family Medicine

## 2023-03-18 ENCOUNTER — Ambulatory Visit (INDEPENDENT_AMBULATORY_CARE_PROVIDER_SITE_OTHER): Payer: 59 | Admitting: Family Medicine

## 2023-03-18 VITALS — BP 184/96 | HR 58 | Wt 245.7 lb

## 2023-03-18 DIAGNOSIS — E785 Hyperlipidemia, unspecified: Secondary | ICD-10-CM | POA: Diagnosis not present

## 2023-03-18 DIAGNOSIS — N1831 Chronic kidney disease, stage 3a: Secondary | ICD-10-CM

## 2023-03-18 DIAGNOSIS — R030 Elevated blood-pressure reading, without diagnosis of hypertension: Secondary | ICD-10-CM

## 2023-03-18 DIAGNOSIS — E871 Hypo-osmolality and hyponatremia: Secondary | ICD-10-CM

## 2023-03-18 DIAGNOSIS — I951 Orthostatic hypotension: Secondary | ICD-10-CM | POA: Diagnosis not present

## 2023-03-18 DIAGNOSIS — F2089 Other schizophrenia: Secondary | ICD-10-CM

## 2023-03-18 MED ORDER — MIDODRINE HCL 2.5 MG PO TABS: 2.5000 mg | ORAL_TABLET | Freq: Two times a day (BID) | ORAL | 0 refills | Status: DC

## 2023-03-18 MED ORDER — FLUDROCORTISONE ACETATE 0.1 MG PO TABS: 100.0000 ug | ORAL_TABLET | Freq: Every day | ORAL | Status: DC

## 2023-03-18 NOTE — Patient Instructions (Addendum)
Take fludrocortisone 0.1mg  ONCE every day  Take midodrine 2.5mg  TWO times every day  Stay away from salt and salty foods

## 2023-03-19 LAB — PROLACTIN: Prolactin: 13 ng/mL (ref 3.6–25.2)

## 2023-03-19 LAB — LIPID PANEL
Chol/HDL Ratio: 3.3 ratio (ref 0.0–5.0)
Cholesterol, Total: 156 mg/dL (ref 100–199)
HDL: 48 mg/dL (ref >39)
LDL Chol Calc (NIH): 85 mg/dL (ref 0–99)
Triglycerides: 129 mg/dL (ref 0–149)
VLDL Cholesterol Cal: 23 mg/dL (ref 5–40)

## 2023-03-19 LAB — CBC
Hematocrit: 40.9 % (ref 37.5–51.0)
Hemoglobin: 13.7 g/dL (ref 13.0–17.7)
MCH: 30 pg (ref 26.6–33.0)
MCHC: 33.5 g/dL (ref 31.5–35.7)
MCV: 90 fL (ref 79–97)
Platelets: 268 10*3/uL (ref 150–450)
RBC: 4.57 x10E6/uL (ref 4.14–5.80)
RDW: 12.9 % (ref 11.6–15.4)
WBC: 9.7 10*3/uL (ref 3.4–10.8)

## 2023-03-19 LAB — COMPREHENSIVE METABOLIC PANEL
ALT: 28 IU/L (ref 0–44)
AST: 24 IU/L (ref 0–40)
Albumin/Globulin Ratio: 1.6 (ref 1.2–2.2)
Albumin: 4.4 g/dL (ref 3.8–4.8)
Alkaline Phosphatase: 117 IU/L (ref 44–121)
BUN/Creatinine Ratio: 8 — ABNORMAL LOW (ref 10–24)
BUN: 10 mg/dL (ref 8–27)
Bilirubin Total: 0.5 mg/dL (ref 0.0–1.2)
CO2: 23 mmol/L (ref 20–29)
Calcium: 9.5 mg/dL (ref 8.6–10.2)
Chloride: 96 mmol/L (ref 96–106)
Creatinine, Ser: 1.22 mg/dL (ref 0.76–1.27)
Globulin, Total: 2.8 g/dL (ref 1.5–4.5)
Glucose: 100 mg/dL — ABNORMAL HIGH (ref 70–99)
Potassium: 4.9 mmol/L (ref 3.5–5.2)
Sodium: 135 mmol/L (ref 134–144)
Total Protein: 7.2 g/dL (ref 6.0–8.5)
eGFR: 63 mL/min/{1.73_m2} (ref >59)

## 2023-03-19 LAB — HEMOGLOBIN A1C
Est. average glucose Bld gHb Est-mCnc: 117 mg/dL
Hgb A1c MFr Bld: 5.7 % — ABNORMAL HIGH (ref 4.8–5.6)

## 2023-03-19 LAB — TSH: TSH: 2.2 u[IU]/mL (ref 0.450–4.500)

## 2023-04-25 ENCOUNTER — Ambulatory Visit: Payer: 59 | Admitting: Family Medicine

## 2023-05-04 ENCOUNTER — Telehealth: Payer: Self-pay

## 2023-05-04 ENCOUNTER — Ambulatory Visit (INDEPENDENT_AMBULATORY_CARE_PROVIDER_SITE_OTHER): Payer: 59

## 2023-05-04 VITALS — Ht 72.0 in | Wt 245.0 lb

## 2023-05-04 DIAGNOSIS — Z0000 Encounter for general adult medical examination without abnormal findings: Secondary | ICD-10-CM

## 2023-05-04 NOTE — Progress Notes (Signed)
.I connected with  Judge L Culmer on 05/04/23 by a audio enabled telemedicine application and verified that I am speaking with the correct person using two identifiers.  Patient Location: Home  Provider Location: Office/Clinic  I discussed the limitations of evaluation and management by telemedicine. The patient expressed understanding and agreed to proceed.  Subjective:   [name] is a 72 y.o. male who presents for Medicare Annual/Subsequent preventive examination.  Review of Systems    Cardiac Risk Factors include: advanced age (>70men, >34 women);obesity (BMI >30kg/m2);male gender;dyslipidemia    Objective:    Today's Vitals   05/04/23 1545  Weight: 245 lb (111.1 kg)  Height: 6' (1.829 m)   Body mass index is 33.23 kg/m.     05/04/2023    3:55 PM 11/26/2022    6:26 AM 03/10/2022    1:05 PM 01/23/2019    8:00 PM 01/23/2019   12:50 PM 12/01/2018   11:08 AM 08/27/2017    2:25 PM  Advanced Directives  Does Patient Have a Medical Advance Directive? Yes Yes No No No  No  Type of Advance Directive Healthcare Power of [employer] Power of Attorney       Does patient want to make changes to medical advance directive?  No - Patient declined       Copy of Healthcare Power of Attorney in Chart?  Yes - validated most recent copy scanned in chart (See row information)       Would patient like information on creating a medical advance directive?   No - Patient declined No - Patient declined   No - Patient declined     Information is confidential and restricted. Go to Review Flowsheets to unlock data.    Current Medications (verified) Outpatient Encounter Medications as of 05/04/2023  Medication Sig   fludrocortisone (FLORINEF) 0.1 MG tablet Take 1 tablet (100 mcg total) by mouth daily.   midodrine (PROAMATINE) 2.5 MG tablet Take 1 tablet (2.5 mg total) by mouth 2 (two) times daily.   OLANZapine (ZYPREXA) 10 MG tablet TAKE 1 TABLET BY MOUTH AT BEDTIME   OVER THE  COUNTER MEDICATION Rexall for constipation   OXcarbazepine (TRILEPTAL) 150 MG tablet Take 1 tablet (150 mg total) by mouth 2 (two) times daily.   PARoxetine (PAXIL) 10 MG tablet Take 1.5 tablets (15 mg total) by mouth daily.   pravastatin (PRAVACHOL) 40 MG tablet TAKE 1 TABLET BY MOUTH DAILY   Psyllium (METAMUCIL) 0.36 g CAPS Take by mouth.   risperiDONE (RISPERDAL) 2 MG tablet TAKE 1 TABLET BY MOUTH AT BEDTIME   donepezil (ARICEPT) 5 MG tablet Take by mouth.   No facility-administered encounter medications on file as of 05/04/2023.    Allergies (verified) Vitamin b12   History: Past Medical History:  Diagnosis Date   Chicken pox    Dizziness    Family history of adverse reaction to anesthesia    Measles    Mumps    PONV (postoperative nausea and vomiting)    Schizophrenia (HCC)    Past Surgical History:  Procedure Laterality Date   ABDOMINAL SURGERY     CATARACT EXTRACTION W/PHACO Right 11/26/2022   Procedure: CATARACT EXTRACTION PHACO AND INTRAOCULAR LENS PLACEMENT (IOC) RIGHT;  Surgeon: Estanislado Pandy, MD;  Location[facility];  Service: Ophthalmology;  Laterality: Right;  24.46 2:03.1   COLON SURGERY     LAPAROTOMY N/A 03/20/2016   Procedure: EXPLORATORY LAPAROTOMY with lysis of adhesions;  Surgeon: Leafy Ro, MD;  Location: [facility];  Service: General;  Laterality: N/A;   None     Family History  Problem Relation Age of Onset   Diabetes Mother    Heart disease Mother    Stroke Mother    Diabetes Father    Hypertension Father    Lung cancer Father    Cancer Father        lung cancer   Schizophrenia Sister    Lung cancer Sister    Colon cancer Sister    Social History   Socioeconomic History   Marital status: Divorced    Spouse name: Not on file   Number of children: 2   Years of education: Not on file   Highest education level: High school graduate  Occupational History   Occupation: Disabled  Tobacco Use   Smoking status: Former     Types: Cigarettes    Quit date: 08/04/2004    Years since quitting: 18.7   Smokeless tobacco: Former    Quit date: 08/04/2004  Vaping Use   Vaping Use: Never used  Substance and Sexual Activity   Alcohol use: No    Alcohol/week: 0.0 standard drinks of alcohol   Drug use: No   Sexual activity: Not Currently  Other Topics Concern   Not on file  Social History Narrative   Not on file   Social Determinants of Health   Financial Resource Strain: Low Risk  (05/04/2023)   Overall Financial Resource Strain (CARDIA)    Difficulty of Paying Living Expenses: Not hard at all  Food Insecurity: No Food Insecurity (05/04/2023)   Hunger Vital Sign    Worried About Running Out of Food in the Last Year: Never true    Ran Out of Food in the Last Year: Never true  Transportation Needs: No Transportation Needs (05/04/2023)   PRAPARE - Administrator, Civil Service (Medical): No    Lack of Transportation (Non-Medical): No  Physical Activity: Insufficiently Active (05/04/2023)   Exercise Vital Sign    Days of Exercise per Week: 3 days    Minutes of Exercise per Session: 30 min  Stress: No Stress Concern Present (05/04/2023)   [employer] of Occupational Health - Occupational Stress Questionnaire    Feeling of Stress : Not at all  Social Connections: Moderately Isolated (05/04/2023)   Social Connection and Isolation Panel [NHANES]    Frequency of Communication with Friends and Family: Twice a week    Frequency of Social Gatherings with Friends and Family: Once a week    Attends Religious Services: 1 to 4 times per year    Active Member of [employer] or Organizations: No    Attends Engineer, structural: Never    Marital Status: Divorced    Tobacco Counseling Counseling given: Not Answered   Clinical Intake:  Pre-visit preparation completed: Yes  Pain : No/denies pain     BMI - recorded: 33.23 Nutritional Status: BMI > 30  Obese Nutritional Risks: None Diabetes:  No  How often do you need to have someone help you when you read instructions, pamphlets, or other written materials from your doctor or pharmacy?: 1 - Never  Diabetic?no  Interpreter Needed?: No  Comments: lives with sister Information entered by :: B.Peyten Weare,LPN   Activities of Daily Living    05/04/2023    3:55 PM 03/18/2023   10:39 AM  In your present state of health, do you have any difficulty performing the following activities:  Hearing? 0 0  Vision?  0 0  Difficulty concentrating or making decisions? 0 0  Walking or climbing stairs? 0 0  Dressing or bathing? 0 0  Doing errands, shopping? 0 0  Preparing Food and eating ? N   Using the Toilet? N   In the past six months, have you accidently leaked urine? N   Do you have problems with loss of bowel control? N   Managing your Medications? N   Managing your Finances? N   Housekeeping or managing your Housekeeping? N     Patient Care Team: Malva Limes, MD as PCP - General (Family Medicine) Kerin Salen, MD as Consulting Physician (Psychiatry) Lonell Face, MD as Consulting Physician (Neurology)  Indicate any recent Medical Services you may have received from other than [facility] providers in the past year (date may be approximate).     Assessment:   This is a routine wellness examination for [name].  Hearing/Vision screen Hearing Screening - Comments:: Adequate hearing Vision Screening - Comments:: Adequate vision;readers only [facility]  Dietary issues and exercise activities discussed: Current Exercise Habits: Home exercise routine, Time (Minutes): 30, Frequency (Times/Week): 4, Weekly Exercise (Minutes/Week): 120, Intensity: Mild   Goals Addressed             This Visit's Progress    DIET - EAT MORE FRUITS AND VEGETABLES   On track      Depression Screen    05/04/2023    3:51 PM 03/18/2023   10:39 AM 11/30/2022    2:42 PM 06/01/2022   11:11 AM 03/10/2022    1:04 PM 02/16/2022   10:54 AM  10/20/2021   10:42 AM  PHQ 2/9 Scores  PHQ - 2 Score 0 0   0    PHQ- 9 Score  0          Information is confidential and restricted. Go to Review Flowsheets to unlock data.    Fall Risk    05/04/2023    3:48 PM 03/18/2023   10:38 AM 03/10/2022    1:06 PM 09/30/2020    3:02 PM 05/13/2020    1:30 PM  Fall Risk   Falls in the past year? 0 0 0 0 0  Number falls in past yr: 0  0 0 0  Injury with Fall? 0 1 0 0 0  Risk for fall due to : No Fall Risks History of fall(s) No Fall Risks    Follow up Education provided;Falls prevention discussed Falls evaluation completed Falls evaluation completed Falls evaluation completed Falls evaluation completed    FALL RISK PREVENTION PERTAINING TO THE HOME:  Any stairs in or around the home? Yes  If so, are there any without handrails? Yes  Home free of loose throw rugs in walkways, pet beds, electrical cords, etc? Yes  Adequate lighting in your home to reduce risk of falls? Yes   ASSISTIVE DEVICES UTILIZED TO PREVENT FALLS:  Life alert? No  Use of a cane, walker or w/c? No  Grab bars in the bathroom? Yes  Shower chair or bench in shower? No  Elevated toilet seat or a handicapped toilet? Yes   Cognitive Function:        05/04/2023    3:58 PM 09/30/2020    3:03 PM  6CIT Screen  What Year? 0 points 0 points  What month? 0 points 0 points  What time? 0 points 0 points  Count back from 20 0 points 0 points  Months in reverse 0 points 0 points  Repeat phrase 0 points 10 points  Total Score 0 points 10 points    Immunizations Immunization History  Administered Date(s) Administered   Fluad Quad(high Dose 65+) 09/30/2020   Influenza Split 09/01/2011   Influenza, High Dose Seasonal PF 09/20/2016   Moderna Sars-Covid-2 Vaccination 06/08/2020, 07/06/2020   Pneumococcal Conjugate-13 05/16/2015   Pneumococcal Polysaccharide-23 09/20/2016    TDAP status: Up to date  Flu Vaccine status: Declined, Education has been provided regarding the  importance of this vaccine but patient still declined. Advised may receive this vaccine at local pharmacy or Health Dept. Aware to provide a copy of the vaccination record if obtained from local pharmacy or Health Dept. Verbalized acceptance and understanding.  Pneumococcal vaccine status: Up to date  Covid-19 vaccine status: Completed vaccines  Qualifies for Shingles Vaccine? Yes   Zostavax completed No   Shingrix Completed?: No.    Education has been provided regarding the importance of this vaccine. Patient has been advised to call insurance company to determine out of pocket expense if they have not yet received this vaccine. Advised may also receive vaccine at local pharmacy or Health Dept. Verbalized acceptance and understanding.  Screening Tests Health Maintenance  Topic Date Due   DTaP/Tdap/Td (1 - Tdap) Never done   Zoster Vaccines- Shingrix (1 of 2) Never done   COLONOSCOPY (Pts 45-50yrs Insurance coverage will need to be confirmed)  12/01/2014   COVID-19 Vaccine (3 - Moderna risk series) 08/03/2020   INFLUENZA VACCINE  07/14/2023   Medicare Annual Wellness (AWV)  05/03/2024   Pneumonia Vaccine 55+ Years old  Completed   Hepatitis C Screening  Completed   HPV VACCINES  Aged Out    Health Maintenance  Health Maintenance Due  Topic Date Due   DTaP/Tdap/Td (1 - Tdap) Never done   Zoster Vaccines- Shingrix (1 of 2) Never done   COLONOSCOPY (Pts 45-12yrs Insurance coverage will need to be confirmed)  12/01/2014   COVID-19 Vaccine (3 - Moderna risk series) 08/03/2020    Colorectal cancer screening: Type of screening: Colonoscopy. Completed yes. Repeat every 10 years pt says doesn't  Lung Cancer Screening: (Low Dose CT Chest recommended if Age 68-80 years, 30 pack-year currently smoking OR have quit w/in 15years.) does not qualify.   Lung Cancer Screening Referral: no  Additional Screening:  Hepatitis C Screening: does not qualify; Completed yes  Vision Screening:  Recommended annual ophthalmology exams for early detection of glaucoma and other disorders of the eye. Is the patient up to date with their annual eye exam?  Yes  Who is the provider or what is the name of the office in which the patient attends annual eye exams? [facility] If pt is not established with a provider, would they like to be referred to a provider to establish care? No .   Dental Screening: Recommended annual dental exams for proper oral hygiene  Community Resource Referral / Chronic Care Management: CRR required this visit?  No   CCM required this visit?  No      Plan:     I have personally reviewed and noted the following in the patient's chart:   Medical and social history Use of alcohol, tobacco or illicit drugs  Current medications and supplements including opioid prescriptions. Patient is not currently taking opioid prescriptions. Functional ability and status Nutritional status Physical activity Advanced directives List of other physicians Hospitalizations, surgeries, and ER visits in previous 12 months Vitals Screenings to include cognitive, depression, and falls Referrals and appointments  In addition, I have reviewed and discussed with patient certain preventive protocols, quality metrics, and best practice recommendations. A written personalized care plan for preventive services as well as general preventive health recommendations were provided to patient.     Sue Lush, LPN   1/61/0960   Nurse Notes: The patient states he is doing well and has no concerns or questions at this time.

## 2023-05-04 NOTE — Telephone Encounter (Signed)
05/04/2023 10:20 AM EDT by Sue Lush, LPN  [name], [name] (Self) [phone] (Home) Remove  Left Message - left msg to rtn call to [facility] for AWV  05/04/2023 10:25 AM EDT by Sue Lush, LPN  [name], [name] (Self) [phone] (Home) Remove  Left Message - to rtn call for AWV r/s

## 2023-05-04 NOTE — Patient Instructions (Signed)
[name] , Thank you for taking time to come for your Medicare Wellness Visit. I appreciate your ongoing commitment to your health goals. Please review the following plan we discussed and let me know if I can assist you in the future.   These are the goals we discussed:  Goals      DIET - EAT MORE FRUITS AND VEGETABLES        This is a list of the screening recommended for you and due dates:  Health Maintenance  Topic Date Due   DTaP/Tdap/Td vaccine (1 - Tdap) Never done   Zoster (Shingles) Vaccine (1 of 2) Never done   Colon Cancer Screening  12/01/2014   COVID-19 Vaccine (3 - Moderna risk series) 08/03/2020   Flu Shot  07/14/2023   Medicare Annual Wellness Visit  05/03/2024   Pneumonia Vaccine  Completed   Hepatitis C Screening: USPSTF Recommendation to screen - Ages 18-79 yo.  Completed   HPV Vaccine  Aged Out    Advanced directives: yes  Conditions/risks identified: low falls risk  Next appointment: Follow up in one year for your annual wellness visit. 05/14/2024 @ 2pm telephone  Preventive Care 65 Years and Older, Male  Preventive care refers to lifestyle choices and visits with your health care provider that can promote health and wellness. What does preventive care include? A yearly physical exam. This is also called an annual well check. Dental exams once or twice a year. Routine eye exams. Ask your health care provider how often you should have your eyes checked. Personal lifestyle choices, including: Daily care of your teeth and gums. Regular physical activity. Eating a healthy diet. Avoiding tobacco and drug use. Limiting alcohol use. Practicing safe sex. Taking low doses of aspirin every day. Taking vitamin and mineral supplements as recommended by your health care provider. What happens during an annual well check? The services and screenings done by your health care provider during your annual well check will depend on your age, overall health, lifestyle  risk factors, and family history of disease. Counseling  Your health care provider may ask you questions about your: Alcohol use. Tobacco use. Drug use. Emotional well-being. Home and relationship well-being. Sexual activity. Eating habits. History of falls. Memory and ability to understand (cognition). Work and work Astronomer. Screening  You may have the following tests or measurements: Height, weight, and BMI. Blood pressure. Lipid and cholesterol levels. These may be checked every 5 years, or more frequently if you are over 68 years old. Skin check. Lung cancer screening. You may have this screening every year starting at age 67 if you have a 30-pack-year history of smoking and currently smoke or have quit within the past 15 years. Fecal occult blood test (FOBT) of the stool. You may have this test every year starting at age 20. Flexible sigmoidoscopy or colonoscopy. You may have a sigmoidoscopy every 5 years or a colonoscopy every 10 years starting at age 54. Prostate cancer screening. Recommendations will vary depending on your family history and other risks. Hepatitis C blood test. Hepatitis B blood test. Sexually transmitted disease (STD) testing. Diabetes screening. This is done by checking your blood sugar (glucose) after you have not eaten for a while (fasting). You may have this done every 1-3 years. Abdominal aortic aneurysm (AAA) screening. You may need this if you are a current or former smoker. Osteoporosis. You may be screened starting at age 26 if you are at high risk. Talk with your health care provider  about your test results, treatment options, and if necessary, the need for more tests. Vaccines  Your health care provider may recommend certain vaccines, such as: Influenza vaccine. This is recommended every year. Tetanus, diphtheria, and acellular pertussis (Tdap, Td) vaccine. You may need a Td booster every 10 years. Zoster vaccine. You may need this after age  48. Pneumococcal 13-valent conjugate (PCV13) vaccine. One dose is recommended after age 30. Pneumococcal polysaccharide (PPSV23) vaccine. One dose is recommended after age 23. Talk to your health care provider about which screenings and vaccines you need and how often you need them. This information is not intended to replace advice given to you by your health care provider. Make sure you discuss any questions you have with your health care provider. Document Released: 12/26/2015 Document Revised: 08/18/2016 Document Reviewed: 09/30/2015 Elsevier Interactive Patient Education  2017 [employer].  Fall Prevention in the Home Falls can cause injuries. They can happen to people of all ages. There are many things you can do to make your home safe and to help prevent falls. What can I do on the outside of my home? Regularly fix the edges of walkways and driveways and fix any cracks. Remove anything that might make you trip as you walk through a door, such as a raised step or threshold. Trim any bushes or trees on the path to your home. Use bright outdoor lighting. Clear any walking paths of anything that might make someone trip, such as rocks or tools. Regularly check to see if handrails are loose or broken. Make sure that both sides of any steps have handrails. Any raised decks and porches should have guardrails on the edges. Have any leaves, snow, or ice cleared regularly. Use sand or salt on walking paths during winter. Clean up any spills in your garage right away. This includes oil or grease spills. What can I do in the bathroom? Use night lights. Install grab bars by the toilet and in the tub and shower. Do not use towel bars as grab bars. Use non-skid mats or decals in the tub or shower. If you need to sit down in the shower, use a plastic, non-slip stool. Keep the floor dry. Clean up any water that spills on the floor as soon as it happens. Remove soap buildup in the tub or shower  regularly. Attach bath mats securely with double-sided non-slip rug tape. Do not have throw rugs and other things on the floor that can make you trip. What can I do in the bedroom? Use night lights. Make sure that you have a light by your bed that is easy to reach. Do not use any sheets or blankets that are too big for your bed. They should not hang down onto the floor. Have a firm chair that has side arms. You can use this for support while you get dressed. Do not have throw rugs and other things on the floor that can make you trip. What can I do in the kitchen? Clean up any spills right away. Avoid walking on wet floors. Keep items that you use a lot in easy-to-reach places. If you need to reach something above you, use a strong step stool that has a grab bar. Keep electrical cords out of the way. Do not use floor polish or wax that makes floors slippery. If you must use wax, use non-skid floor wax. Do not have throw rugs and other things on the floor that can make you trip. What can I do  with my stairs? Do not leave any items on the stairs. Make sure that there are handrails on both sides of the stairs and use them. Fix handrails that are broken or loose. Make sure that handrails are as long as the stairways. Check any carpeting to make sure that it is firmly attached to the stairs. Fix any carpet that is loose or worn. Avoid having throw rugs at the top or bottom of the stairs. If you do have throw rugs, attach them to the floor with carpet tape. Make sure that you have a light switch at the top of the stairs and the bottom of the stairs. If you do not have them, ask someone to add them for you. What else can I do to help prevent falls? Wear shoes that: Do not have high heels. Have rubber bottoms. Are comfortable and fit you well. Are closed at the toe. Do not wear sandals. If you use a stepladder: Make sure that it is fully opened. Do not climb a closed stepladder. Make sure that  both sides of the stepladder are locked into place. Ask someone to hold it for you, if possible. Clearly mark and make sure that you can see: Any grab bars or handrails. First and last steps. Where the edge of each step is. Use tools that help you move around (mobility aids) if they are needed. These include: Canes. Walkers. Scooters. Crutches. Turn on the lights when you go into a dark area. Replace any light bulbs as soon as they burn out. Set up your furniture so you have a clear path. Avoid moving your furniture around. If any of your floors are uneven, fix them. If there are any pets around you, be aware of where they are. Review your medicines with your doctor. Some medicines can make you feel dizzy. This can increase your chance of falling. Ask your doctor what other things that you can do to help prevent falls. This information is not intended to replace advice given to you by your health care provider. Make sure you discuss any questions you have with your health care provider. Document Released: 09/25/2009 Document Revised: 05/06/2016 Document Reviewed: 01/03/2015 Elsevier Interactive Patient Education  2017 [employer].

## 2023-05-04 NOTE — Telephone Encounter (Signed)
05/04/2023 10:29 AM EDT by Sue Lush, LPN  Outgoing Harari,Mary S (EC) [phone] (Mobile) Remove  no vm available to contact pt sister for check in with pt re:AWV

## 2023-05-10 ENCOUNTER — Other Ambulatory Visit: Payer: Self-pay | Admitting: Family Medicine

## 2023-05-10 DIAGNOSIS — I951 Orthostatic hypotension: Secondary | ICD-10-CM

## 2023-05-16 ENCOUNTER — Other Ambulatory Visit: Payer: Self-pay | Admitting: Psychiatry

## 2023-05-16 DIAGNOSIS — F411 Generalized anxiety disorder: Secondary | ICD-10-CM

## 2023-05-16 DIAGNOSIS — F209 Schizophrenia, unspecified: Secondary | ICD-10-CM

## 2023-05-18 ENCOUNTER — Ambulatory Visit: Payer: 59 | Admitting: Family Medicine

## 2023-05-25 ENCOUNTER — Encounter: Payer: Self-pay | Admitting: Psychiatry

## 2023-05-25 ENCOUNTER — Ambulatory Visit (INDEPENDENT_AMBULATORY_CARE_PROVIDER_SITE_OTHER): Payer: 59 | Admitting: Psychiatry

## 2023-05-25 VITALS — BP 133/69 | HR 70 | Temp 98.0°F | Ht 72.0 in | Wt 239.0 lb

## 2023-05-25 DIAGNOSIS — F209 Schizophrenia, unspecified: Secondary | ICD-10-CM | POA: Diagnosis not present

## 2023-05-25 DIAGNOSIS — Z79899 Other long term (current) drug therapy: Secondary | ICD-10-CM | POA: Diagnosis not present

## 2023-05-25 DIAGNOSIS — F411 Generalized anxiety disorder: Secondary | ICD-10-CM

## 2023-05-25 NOTE — Progress Notes (Signed)
[facility] MD OP Progress Note  05/25/2023 11:02 AM [name]  MRN:  [id number]  Chief Complaint:  Chief Complaint  Patient presents with   Follow-up   Anxiety   Medication Refill   Schizophrenia   HPI: [name] is a 72 year old Caucasian male, divorced, lives in [place], has a history of schizophrenia, GAD was evaluated in the office today.  Patient being a limited historian, sister, [name], provided collateral information.  Patient overall currently doing well except they have been having some interpersonal conflicts when it comes to deciding what groceries to buy, how much money to spend and things like that.  Patient as well as sister agrees to have a plan and work on it.  Otherwise denies any significant mood lability.  Denies any depression, anxiety.  Current medications are beneficial.  Patient reports sleep as good.  Appetite is fair.  He is currently watching his diet.  He has lost a few pounds.  Excited about that.  Patient appeared to be alert, oriented to person place time and situation.  3 word memory immediate 3 out of 3, after 5 minutes 2 out of 3.  Does report constipation, likely multifactorial.  He does have Dulcolax available.  Agrees to talk to Dr. Sherrie Mustache.  Reviewed and discussed labs with patient, within normal limits.  Denies any other concerns today.  Visit Diagnosis:    ICD-10-CM   1. Schizophrenia in full remission with history of multiple episodes (HCC)  F20.9     2. GAD (generalized anxiety disorder)  F41.1     3. High risk medication use  Z79.899       Past Psychiatric History: I have reviewed past psychiatric history from progress note on 02/23/2018.  Past trials of Haldol, Klonopin, Seroquel.  Past Medical History:  Past Medical History:  Diagnosis Date   Chicken pox    Dizziness    Family history of adverse reaction to anesthesia    Measles    Mumps    PONV (postoperative nausea and vomiting)    Schizophrenia (HCC)     Past  Surgical History:  Procedure Laterality Date   ABDOMINAL SURGERY     CATARACT EXTRACTION W/PHACO Right 11/26/2022   Procedure: CATARACT EXTRACTION PHACO AND INTRAOCULAR LENS PLACEMENT (IOC) RIGHT;  Surgeon: Estanislado Pandy, MD;  [facility]: [facility];  Service: Ophthalmology;  Laterality: Right;  24.46 2:03.1   COLON SURGERY     LAPAROTOMY N/A 03/20/2016   Procedure: EXPLORATORY LAPAROTOMY with lysis of adhesions;  Surgeon: Leafy Ro, MD;  Location: [facility];  Service: General;  Laterality: N/A;   None      Family Psychiatric History: Reviewed family psychiatric history from progress note on 02/23/2018.  Family History:  Family History  Problem Relation Age of Onset   Diabetes Mother    Heart disease Mother    Stroke Mother    Diabetes Father    Hypertension Father    Lung cancer Father    Cancer Father        lung cancer   Schizophrenia Sister    Lung cancer Sister    Colon cancer Sister     Social History: Reviewed social history from progress note on 02/23/2018. Social History   Socioeconomic History   Marital status: Divorced    Spouse name: Not on file   Number of children: 2   Years of education: Not on file   Highest education level: High school graduate  Occupational History  Occupation: Disabled  Tobacco Use   Smoking status: Former    Types: Cigarettes    Quit date: 08/04/2004    Years since quitting: 18.8   Smokeless tobacco: Former    Quit date: 08/04/2004  Vaping Use   Vaping Use: Never used  Substance and Sexual Activity   Alcohol use: No    Alcohol/week: 0.0 standard drinks of alcohol   Drug use: No   Sexual activity: Not Currently  Other Topics Concern   Not on file  Social History Narrative   Not on file   Social Determinants of Health   Financial Resource Strain: Low Risk  (05/04/2023)   Overall Financial Resource Strain (CARDIA)    Difficulty of Paying Living Expenses: Not hard at all  Food Insecurity: No Food Insecurity  (05/04/2023)   Hunger Vital Sign    Worried About Running Out of Food in the Last Year: Never true    Ran Out of Food in the Last Year: Never true  Transportation Needs: No Transportation Needs (05/04/2023)   PRAPARE - Administrator, Civil Service (Medical): No    Lack of Transportation (Non-Medical): No  Physical Activity: Insufficiently Active (05/04/2023)   Exercise Vital Sign    Days of Exercise per Week: 3 days    Minutes of Exercise per Session: 30 min  Stress: No Stress Concern Present (05/04/2023)   [employer] of Occupational Health - Occupational Stress Questionnaire    Feeling of Stress : Not at all  Social Connections: Moderately Isolated (05/04/2023)   Social Connection and Isolation Panel [NHANES]    Frequency of Communication with Friends and Family: Twice a week    Frequency of Social Gatherings with Friends and Family: Once a week    Attends Religious Services: 1 to 4 times per year    Active Member of [employer] or Organizations: No    Attends Banker Meetings: Never    Marital Status: Divorced    Allergies:  Allergies  Allergen Reactions   Vitamin B12 Other (See Comments)    interacted with other medications  interacted with other medications     Metabolic Disorder Labs: Lab Results  Component Value Date   HGBA1C 5.7 (H) 03/18/2023   MPG 105.41 11/07/2020   Lab Results  Component Value Date   PROLACTIN 13.0 03/18/2023   PROLACTIN 17.3 (H) 11/07/2020   Lab Results  Component Value Date   CHOL 156 03/18/2023   TRIG 129 03/18/2023   HDL 48 03/18/2023   CHOLHDL 3.3 03/18/2023   LDLCALC 85 03/18/2023   LDLCALC 105 (H) 10/07/2020   Lab Results  Component Value Date   TSH 2.200 03/18/2023   TSH 2.420 06/14/2019    Therapeutic Level Labs: No results found for: "LITHIUM" No results found for: "VALPROATE" No results found for: "CBMZ"  Current Medications: Current Outpatient Medications  Medication Sig Dispense Refill    fludrocortisone (FLORINEF) 0.1 MG tablet Take 1 tablet (100 mcg total) by mouth daily.     midodrine (PROAMATINE) 2.5 MG tablet TAKE 1 TABLET BY MOUTH TWICE A DAY 90 tablet 0   OLANZapine (ZYPREXA) 10 MG tablet TAKE 1 TABLET BY MOUTH AT BEDTIME 90 tablet 0   OVER THE COUNTER MEDICATION [place] for constipation     OXcarbazepine (TRILEPTAL) 150 MG tablet TAKE 1 TABLET BY MOUTH TWICE A DAY 180 tablet 0   PARoxetine (PAXIL) 10 MG tablet TAKE ONE AND ONE-HALF TABLETS BY MOUTH EVERY DAY 135 tablet 0  pravastatin (PRAVACHOL) 40 MG tablet TAKE 1 TABLET BY MOUTH DAILY 90 tablet 4   Psyllium (METAMUCIL) 0.36 g CAPS Take by mouth.     risperiDONE (RISPERDAL) 2 MG tablet TAKE 1 TABLET BY MOUTH AT BEDTIME 90 tablet 0   donepezil (ARICEPT) 5 MG tablet Take by mouth.     No current facility-administered medications for this visit.     Musculoskeletal: Strength & Muscle Tone: within normal limits Gait & Station: normal Patient leans: N/A  Psychiatric Specialty Exam: Review of Systems  Gastrointestinal:  Positive for constipation.  Psychiatric/Behavioral: Negative.      Blood pressure 133/69, pulse 70, temperature 98 F (36.7 C), temperature source Skin, height 6' (1.829 m), weight 239 lb (108.4 kg).Body mass index is 32.41 kg/m.  General Appearance: Fairly Groomed  Eye Contact:  Fair  Speech:  Clear and Coherent  Volume:  Normal  Mood:  Euthymic  Affect:  Congruent  Thought Process:  Goal Directed and Descriptions of Associations: Intact  Orientation:  Full (Time, Place, and Person)  Thought Content: Logical   Suicidal Thoughts:  No  Homicidal Thoughts:  No  Memory:  Immediate;   Fair Recent;   Fair Remote;   Poor  Judgement:  Fair  Insight:  Shallow  Psychomotor Activity:  Normal  Concentration:  Concentration: Fair and Attention Span: Fair  Recall:  [employer] of Knowledge: Fair  Language: Fair  Akathisia:  No  Handed:  Right  AIMS (if indicated): done  Assets:  Communication  Skills Desire for Improvement Housing Social Support  ADL's:  Intact  Cognition: WNL  Sleep:  Fair   Screenings: Geneticist, molecular Office Visit from 05/25/2023 in [facility] Office Visit from 11/30/2022 in [facility] Video Visit from 06/01/2022 in [facility] Office Visit from 02/16/2022 in [facility] Office Visit from 10/20/2021 in [facility]  AIMS Total Score 0 0 0 0 0      GAD-7    Flowsheet Row Office Visit from 05/25/2023 in [facility] Office Visit from 11/30/2022 in [facility] Video Visit from 06/01/2022 in [facility] Office Visit from 04/21/2021 in [facility]  Total GAD-7 Score 1 0 2 1      PHQ2-9    Flowsheet Row Office Visit from 05/25/2023 in [facility] Clinical Support from 05/04/2023 in [facility] Office Visit from 03/18/2023 in [facility] Office Visit from 11/30/2022 in [facility] Video Visit from 06/01/2022 in [facility]  PHQ-2 Total Score 0 0 0 0 0  PHQ-9 Total Score 0 -- 0 0 --      Flowsheet Row Office Visit from 05/25/2023 in [facility] Office Visit from 11/30/2022 in [facility] Admission (Discharged) from 11/26/2022 in [facility]  C-SSRS RISK CATEGORY No Risk No Risk No Risk        Assessment and Plan: [name] is a 72 year old Caucasian male, divorced, lives in [place], has a history of  schizophrenia, GAD was evaluated in office today.  Patient is currently stable, will benefit from the following plan.  Plan Schizophrenia-stable Olanzapine 10 mg p.o. nightly, reduced  dosage. Risperidone 2 mg p.o. nightly Trileptal 150 mg p.o. twice daily Patient on 2 antipsychotic medications , aware of side effects due to long-term use, previous attempts to taper him off to monotherapy were not successful.  GAD-stable Paxil 50 mg p.o. daily  High risk medication use-reviewed and discussed labs-CMP-glucose slightly higher at 100 mg otherwise within normal limits, lipid panel-within normal limits, hemoglobin A1c elevated at 5.7, prolactin-normal, TSH-normal-Labs results on 03/18/2023.  Patient to continue to follow up with primary care provider for abnormal labs. Patient advised to continue diet management, exercise.  Patient with history of memory problems, patient had neurology consultation with Dr. Sherryll Burger, was prescribed Aricept.  Patient no longer on it.  Advised to follow-up with neurology.  Collateral information obtained from sister.  Follow-up in clinic in 6 months or sooner if needed.    Consent: Patient/Guardian gives verbal consent for treatment and assignment of benefits for services provided during this visit. Patient/Guardian expressed understanding and agreed to proceed.   This note was generated in part or whole with voice recognition software. Voice recognition is usually quite accurate but there are transcription errors that can and very often do occur. I apologize for any typographical errors that were not detected and corrected.      Jomarie Longs, MD 05/25/2023, 11:02 AM

## 2023-06-02 ENCOUNTER — Ambulatory Visit: Payer: Medicare Other | Admitting: Psychiatry

## 2023-06-03 ENCOUNTER — Encounter: Payer: Self-pay | Admitting: Family Medicine

## 2023-06-03 ENCOUNTER — Ambulatory Visit (INDEPENDENT_AMBULATORY_CARE_PROVIDER_SITE_OTHER): Payer: 59 | Admitting: Family Medicine

## 2023-06-03 VITALS — BP 130/73 | HR 73 | Temp 98.3°F | Resp 20 | Wt 238.8 lb

## 2023-06-03 DIAGNOSIS — Z860100 Personal history of colon polyps, unspecified: Secondary | ICD-10-CM

## 2023-06-03 DIAGNOSIS — K5900 Constipation, unspecified: Secondary | ICD-10-CM

## 2023-06-03 DIAGNOSIS — Z1211 Encounter for screening for malignant neoplasm of colon: Secondary | ICD-10-CM

## 2023-06-03 DIAGNOSIS — I950 Idiopathic hypotension: Secondary | ICD-10-CM | POA: Diagnosis not present

## 2023-06-03 DIAGNOSIS — N1831 Chronic kidney disease, stage 3a: Secondary | ICD-10-CM

## 2023-06-03 DIAGNOSIS — Z125 Encounter for screening for malignant neoplasm of prostate: Secondary | ICD-10-CM

## 2023-06-03 MED ORDER — LUBIPROSTONE 24 MCG PO CAPS
24.0000 ug | ORAL_CAPSULE | Freq: Two times a day (BID) | ORAL | 1 refills | Status: AC
Start: 2023-06-03 — End: ?

## 2023-06-03 MED ORDER — LUBIPROSTONE 24 MCG PO CAPS: 24.0000 ug | ORAL_CAPSULE | Freq: Two times a day (BID) | ORAL | 1 refills | Status: DC

## 2023-06-03 NOTE — Progress Notes (Signed)
I,Sulibeya S Dimas,acting as a scribe for Mila Merry, MD.,have documented all relevant documentation on the behalf of Mila Merry, MD,as directed by  Mila Merry, MD    Established patient visit   Patient: [name]   DOB: 03-Oct-1951   72 y.o. Male  MRN: [id number] Visit Date: 06/03/2023  Today's healthcare provider: Mila Merry, MD   Chief Complaint  Patient presents with   Follow-up   Subjective    HPI  Follow up for elevated blood pressure reading  The patient was last seen for this 2 months ago. Changes made at last visit include cut back on salt in diet. Reduce fludrocortisone from BID to daily.  He reports fair compliance with treatment. He is taking fludrocortisone BID again about 2 weeks now. He feels that condition is  unchanged . He is having side effects. Bp was getting too low. BP this morning 115/73 pulse 91.   BP Readings from Last 3 Encounters:  03/18/23 (!) 184/96  11/26/22 (!) 163/85  06/10/22 126/72  ----------------------------------------------------------------------------------------- Discussed the use of AI scribe software for clinical note transcription with the patient, who gave verbal consent to proceed.  History of Present Illness   The patient also reports ongoing issues with constipation. He has tried over-the-counter remedies such as Ex-Lax and Miralax, but these have not been effective. The patient also mentions feeling sick after taking Ex-Lax.  The patient has a history of colon polyps and is overdue for a colonoscopy. He also expresses concerns about his prostate health and inquires about the need for a PSA test.       Medications: Outpatient Medications Prior to Visit  Medication Sig   fludrocortisone (FLORINEF) 0.1 MG tablet Take 1 tablet (100 mcg total) by mouth daily. (Patient taking differently: Take 100 mcg by mouth 2 (two) times daily.)   midodrine (PROAMATINE) 2.5 MG tablet TAKE 1 TABLET BY MOUTH TWICE A DAY    OLANZapine (ZYPREXA) 10 MG tablet TAKE 1 TABLET BY MOUTH AT BEDTIME   OVER THE COUNTER MEDICATION Rexall for constipation   OXcarbazepine (TRILEPTAL) 150 MG tablet TAKE 1 TABLET BY MOUTH TWICE A DAY   PARoxetine (PAXIL) 10 MG tablet TAKE ONE AND ONE-HALF TABLETS BY MOUTH EVERY DAY   pravastatin (PRAVACHOL) 40 MG tablet TAKE 1 TABLET BY MOUTH DAILY   Psyllium (METAMUCIL) 0.36 g CAPS Take by mouth.   risperiDONE (RISPERDAL) 2 MG tablet TAKE 1 TABLET BY MOUTH AT BEDTIME   donepezil (ARICEPT) 5 MG tablet Take by mouth.   No facility-administered medications prior to visit.    Review of Systems     Objective    BP 130/73 (BP Location: Left Arm, Patient Position: Sitting, Cuff Size: Large)   Pulse 73   Temp 98.3 F (36.8 C) (Temporal)   Resp 20   Wt 238 lb 12.8 oz (108.3 kg)   SpO2 99%   BMI 32.39 kg/m    Physical Exam  General appearance: Mildly obese male, cooperative and in no acute distress Head: Normocephalic, without obvious abnormality, atraumatic Respiratory: Respirations even and unlabored, normal respiratory rate Extremities: All extremities are intact.  Skin: Skin color, texture, turgor normal. No rashes seen  Psych: Appropriate mood and affect. Neurologic: Mental status: Alert, oriented to person, place, and time, thought content appropriate.   Assessment & Plan     Assessment and Plan    Hypotension: Fludrocortisone dose was increased from 1 to 2 the last weekdue to low blood pressure readings. No recent episodes of  hypertension reported. -Continue Fludrocortisone 2 daily. -Check sodium levels today.  Constipation: Chronic issue, not adequately managed with current regimen including Miralax and X-Lax. -Prescribe Amitiza for both patients.  Prostate Health: It has been a while since the last PSA check. -Order PSA test today.  Colon Health: History of colon polyps. Patient due for colonoscopy. -Refer for colonoscopy. -Check sodium levels today.              Mila Merry, MD  [facility] [phone] (phone) [phone] (fax)  [facility]

## 2023-06-04 LAB — RENAL FUNCTION PANEL
Albumin: 3.8 g/dL (ref 3.8–4.8)
BUN/Creatinine Ratio: 11 (ref 10–24)
BUN: 15 mg/dL (ref 8–27)
CO2: 24 mmol/L (ref 20–29)
Calcium: 9.6 mg/dL (ref 8.6–10.2)
Chloride: 100 mmol/L (ref 96–106)
Creatinine, Ser: 1.32 mg/dL — ABNORMAL HIGH (ref 0.76–1.27)
Glucose: 86 mg/dL (ref 70–99)
Phosphorus: 2.3 mg/dL — ABNORMAL LOW (ref 2.8–4.1)
Potassium: 4.6 mmol/L (ref 3.5–5.2)
Sodium: 137 mmol/L (ref 134–144)
eGFR: 57 mL/min/{1.73_m2} — ABNORMAL LOW (ref >59)

## 2023-06-04 LAB — PSA TOTAL (REFLEX TO FREE): Prostate Specific Ag, Serum: 0.5 ng/mL (ref 0.0–4.0)

## 2023-06-07 ENCOUNTER — Other Ambulatory Visit: Payer: Self-pay | Admitting: Psychiatry

## 2023-06-07 DIAGNOSIS — F209 Schizophrenia, unspecified: Secondary | ICD-10-CM

## 2023-06-07 DIAGNOSIS — F411 Generalized anxiety disorder: Secondary | ICD-10-CM

## 2023-06-14 ENCOUNTER — Other Ambulatory Visit: Payer: Self-pay | Admitting: Psychiatry

## 2023-06-14 DIAGNOSIS — F411 Generalized anxiety disorder: Secondary | ICD-10-CM

## 2023-06-27 ENCOUNTER — Other Ambulatory Visit: Payer: Self-pay | Admitting: Family Medicine

## 2023-06-27 DIAGNOSIS — I951 Orthostatic hypotension: Secondary | ICD-10-CM

## 2023-07-22 ENCOUNTER — Telehealth: Payer: Self-pay | Admitting: Family Medicine

## 2023-07-22 NOTE — Telephone Encounter (Signed)
Shan with [facility] is calling in because she needs the codes for pt's labs for their A1C hemoglobin lab from 03/18/23. Please follow up with [place] [phone]

## 2023-07-25 NOTE — Telephone Encounter (Signed)
You can use hyperglycemia, R73.9

## 2023-07-27 NOTE — Telephone Encounter (Signed)
Called [facility] and gave information. Spoke with Liborio Nixon.

## 2023-08-16 ENCOUNTER — Other Ambulatory Visit: Payer: Self-pay | Admitting: Psychiatry

## 2023-08-16 DIAGNOSIS — F411 Generalized anxiety disorder: Secondary | ICD-10-CM

## 2023-08-16 DIAGNOSIS — F209 Schizophrenia, unspecified: Secondary | ICD-10-CM

## 2023-09-19 ENCOUNTER — Other Ambulatory Visit: Payer: Self-pay | Admitting: Psychiatry

## 2023-09-19 ENCOUNTER — Other Ambulatory Visit: Payer: Self-pay | Admitting: Child and Adolescent Psychiatry

## 2023-09-19 DIAGNOSIS — F411 Generalized anxiety disorder: Secondary | ICD-10-CM

## 2023-09-19 DIAGNOSIS — F209 Schizophrenia, unspecified: Secondary | ICD-10-CM

## 2023-09-19 NOTE — Telephone Encounter (Signed)
Dr. Eappen's pt

## 2023-10-03 ENCOUNTER — Other Ambulatory Visit: Payer: Self-pay | Admitting: Family Medicine

## 2023-10-03 DIAGNOSIS — I951 Orthostatic hypotension: Secondary | ICD-10-CM

## 2023-10-04 NOTE — Telephone Encounter (Signed)
Requested medication (s) are due for refill today: yes   Requested medication (s) are on the active medication list: yes   Last refill:  03/18/23  Future visit scheduled: no  Notes to clinic:  not delegated per protocol. Class: no print noted. Do you want to refill Rx? Pharmacy requesting      Requested Prescriptions  Pending Prescriptions Disp Refills   fludrocortisone (FLORINEF) 0.1 MG tablet [Pharmacy Med Name: FLUDROCORTISONE ACETATE 0.1 MG TAB] 180 tablet     Sig: TAKE 1 TABLET BY MOUTH TWICE A DAY     Not Delegated - Endocrinology: Oral Corticosteroids - fludrocortisone Failed - 10/03/2023  9:47 AM      Failed - This refill cannot be delegated      Failed - Manual Review: Eye exam for IOP if prolonged treatment      Failed - Bone Mineral Density or Dexa Scan completed in the last 2 years      Passed - K in normal range and within 180 days    Potassium  Date Value Ref Range Status  06/03/2023 4.6 3.5 - 5.2 mmol/L Final         Passed - Na in normal range and within 180 days    Sodium  Date Value Ref Range Status  06/03/2023 137 134 - 144 mmol/L Final         Passed - Glucose (serum) in normal range and within 180 days    Glucose  Date Value Ref Range Status  06/03/2023 86 70 - 99 mg/dL Final  09/32/3557 93 mg/dL Final   Glucose, Bld  Date Value Ref Range Status  07/21/2021 91 70 - 99 mg/dL Final    Comment:    Glucose reference range applies only to samples taken after fasting for at least 8 hours.   Glucose-Capillary  Date Value Ref Range Status  01/25/2019 115 (H) 70 - 99 mg/dL Final         Passed - Last BP in normal range    BP Readings from Last 1 Encounters:  06/03/23 130/73         Passed - Valid encounter within last 6 months    Recent Outpatient Visits           4 months ago Idiopathic hypotension   [place] [facility] Malva Limes, MD   6 months ago Orthostatic hypotension   [place] [facility]  Malva Limes, MD   1 year ago Orthostatic hypotension   [place] [facility] [place], [place], [place]   2 years ago Stage 3a chronic kidney disease [facility])   [place] [facility] Malva Limes, MD   2 years ago High risk medication use   [place] [facility] Malva Limes, MD

## 2023-10-17 ENCOUNTER — Other Ambulatory Visit: Payer: Self-pay | Admitting: Family Medicine

## 2023-11-24 ENCOUNTER — Encounter: Payer: Self-pay | Admitting: Psychiatry

## 2023-11-24 ENCOUNTER — Ambulatory Visit (INDEPENDENT_AMBULATORY_CARE_PROVIDER_SITE_OTHER): Payer: 59 | Admitting: Psychiatry

## 2023-11-24 VITALS — BP 187/87 | HR 76 | Temp 97.8°F | Ht 72.0 in | Wt 242.4 lb

## 2023-11-24 DIAGNOSIS — F209 Schizophrenia, unspecified: Secondary | ICD-10-CM

## 2023-11-24 DIAGNOSIS — Z79899 Other long term (current) drug therapy: Secondary | ICD-10-CM

## 2023-11-24 DIAGNOSIS — F411 Generalized anxiety disorder: Secondary | ICD-10-CM | POA: Diagnosis not present

## 2023-11-24 NOTE — Progress Notes (Signed)
BH MD OP Progress Note  11/24/2023 11:12 AM IKAIKA BHATIA  MRN:  [id number]  Chief Complaint:  Chief Complaint  Patient presents with   Follow-up   Anxiety   Depression   Medication Refill   HPI: [name] is a 72 year old Caucasian male, divorced, lives in [place], has a history of schizophrenia, GAD was evaluated in office today.   The patient today presents with concerns about worsening memory. He reports frequent forgetfulness, which has been causing him distress. He also mentions a significant increase in his blood pressure, with a recent reading of 187/87. The patient's diet is high in sodium, consuming large quantities of peanut butter and salted crackers regularly, which may be contributing to his elevated blood pressure.  The patient's mood and anxiety appear to be stable, although he reports occasional irritability. He lives with his sister and a 73 year old grandson, whose behavior sometimes causes tension in the household. The patient's sister reports that the patient can become 'snappy' and seems to be affected by the conflicts between her and the grandson.  The patient is on multiple medications, but he struggles to recall their names or purposes. He mentions running out of two medications recently, which he believes might be affecting his memory. However, he is unsure about the specific role of these medications. He expresses a desire for a medication to improve his memory.  Patient was previously prescribed Aricept by neurology however patient does not know if he is still on this.  Attempted to obtain collateral information from sister.  Sister agrees to check his medication bottle at home.  Sister agrees to reach out to her neurologist again for further management. The patient's memory was evaluated during the visit, and he scored 28 out of 30 on MMSE, indicating that his memory function is relatively intact despite his concerns.  Visit Diagnosis:    ICD-10-CM    1. Schizophrenia in full remission with history of multiple episodes (HCC)  F20.9     2. GAD (generalized anxiety disorder)  F41.1     3. High risk medication use  Z79.899 Sodium    Hepatic function panel    BUN+Creat      Past Psychiatric History: I have reviewed past psychiatric history from progress note on 02/23/2018.  Past trials of Haldol, Klonopin, Seroquel.  Past Medical History:  Past Medical History:  Diagnosis Date   Chicken pox    Dizziness    Family history of adverse reaction to anesthesia    Measles    Mumps    PONV (postoperative nausea and vomiting)    Schizophrenia (HCC)     Past Surgical History:  Procedure Laterality Date   ABDOMINAL SURGERY     CATARACT EXTRACTION W/PHACO Right 11/26/2022   Procedure: CATARACT EXTRACTION PHACO AND INTRAOCULAR LENS PLACEMENT (IOC) RIGHT;  Surgeon: Estanislado Pandy, MD;  Location: [facility];  Service: Ophthalmology;  Laterality: Right;  24.46 2:03.1   COLON SURGERY     LAPAROTOMY N/A 03/20/2016   Procedure: EXPLORATORY LAPAROTOMY with lysis of adhesions;  Surgeon: Leafy Ro, MD;  Location: [facility];  Service: General;  Laterality: N/A;   None      Family Psychiatric History: I have reviewed family psychiatric history from progress note on 02/23/2018.  Family History:  Family History  Problem Relation Age of Onset   Diabetes Mother    Heart disease Mother    Stroke Mother    Diabetes Father    Hypertension Father  Lung cancer Father    Cancer Father        lung cancer   Schizophrenia Sister    Lung cancer Sister    Colon cancer Sister     Social History: I have reviewed social history from progress note on 02/23/2018. Social History   Socioeconomic History   Marital status: Divorced    Spouse name: Not on file   Number of children: 2   Years of education: Not on file   Highest education level: High school graduate  Occupational History   Occupation: Disabled  Tobacco Use   Smoking  status: Former    Current packs/day: 0.00    Types: Cigarettes    Quit date: 08/04/2004    Years since quitting: 19.3   Smokeless tobacco: Former    Quit date: 08/04/2004  Vaping Use   Vaping status: Never Used  Substance and Sexual Activity   Alcohol use: No    Alcohol/week: 0.0 standard drinks of alcohol   Drug use: No   Sexual activity: Not Currently  Other Topics Concern   Not on file  Social History Narrative   Not on file   Social Drivers of Health   Financial Resource Strain: Low Risk  (05/04/2023)   Overall Financial Resource Strain (CARDIA)    Difficulty of Paying Living Expenses: Not hard at all  Food Insecurity: No Food Insecurity (05/04/2023)   Hunger Vital Sign    Worried About Running Out of Food in the Last Year: Never true    Ran Out of Food in the Last Year: Never true  Transportation Needs: No Transportation Needs (05/04/2023)   PRAPARE - Administrator, Civil Service (Medical): No    Lack of Transportation (Non-Medical): No  Physical Activity: Insufficiently Active (05/04/2023)   Exercise Vital Sign    Days of Exercise per Week: 3 days    Minutes of Exercise per Session: 30 min  Stress: No Stress Concern Present (05/04/2023)   [employer] of Occupational Health - Occupational Stress Questionnaire    Feeling of Stress : Not at all  Social Connections: Moderately Isolated (05/04/2023)   Social Connection and Isolation Panel [NHANES]    Frequency of Communication with Friends and Family: Twice a week    Frequency of Social Gatherings with Friends and Family: Once a week    Attends Religious Services: 1 to 4 times per year    Active Member of [employer] or Organizations: No    Attends Banker Meetings: Never    Marital Status: Divorced    Allergies:  Allergies  Allergen Reactions   Vitamin B12 Other (See Comments)    interacted with other medications  interacted with other medications     Metabolic Disorder Labs: Lab Results   Component Value Date   HGBA1C 5.7 (H) 03/18/2023   MPG 105.41 11/07/2020   Lab Results  Component Value Date   PROLACTIN 13.0 03/18/2023   PROLACTIN 17.3 (H) 11/07/2020   Lab Results  Component Value Date   CHOL 156 03/18/2023   TRIG 129 03/18/2023   HDL 48 03/18/2023   CHOLHDL 3.3 03/18/2023   LDLCALC 85 03/18/2023   LDLCALC 105 (H) 10/07/2020   Lab Results  Component Value Date   TSH 2.200 03/18/2023   TSH 2.420 06/14/2019    Therapeutic Level Labs: No results found for: "LITHIUM" No results found for: "VALPROATE" No results found for: "CBMZ"  Current Medications: Current Outpatient Medications  Medication Sig Dispense Refill  fludrocortisone (FLORINEF) 0.1 MG tablet Take 1 tablet (100 mcg total) by mouth 2 (two) times daily. 180 tablet 2   lubiprostone (AMITIZA) 24 MCG capsule Take 1 capsule (24 mcg total) by mouth 2 (two) times daily with a meal. 60 capsule 1   midodrine (PROAMATINE) 2.5 MG tablet TAKE 1 TABLET BY MOUTH TWICE A DAY 90 tablet 3   OLANZapine (ZYPREXA) 10 MG tablet TAKE 1 TABLET BY MOUTH AT BEDTIME 90 tablet 1   OVER THE COUNTER MEDICATION Rexall for constipation     OXcarbazepine (TRILEPTAL) 150 MG tablet TAKE 1 TABLET BY MOUTH TWICE A DAY 180 tablet 1   PARoxetine (PAXIL) 10 MG tablet TAKE 1 AND 1/2 TABLETS BY MOUTH DAILY 135 tablet 1   pravastatin (PRAVACHOL) 40 MG tablet TAKE 1 TABLET BY MOUTH DAILY 90 tablet 4   Psyllium (METAMUCIL) 0.36 g CAPS Take by mouth.     risperiDONE (RISPERDAL) 2 MG tablet TAKE 1 TABLET BY MOUTH AT BEDTIME 90 tablet 1   donepezil (ARICEPT) 5 MG tablet Take by mouth.     No current facility-administered medications for this visit.     Musculoskeletal: Strength & Muscle Tone: within normal limits Gait & Station: normal Patient leans: N/A  Psychiatric Specialty Exam: Review of Systems  Psychiatric/Behavioral:  The patient is nervous/anxious.     Blood pressure (!) 187/87, pulse 76, temperature 97.8 F (36.6 C),  temperature source Skin, height 6' (1.829 m), weight 242 lb 6.4 oz (110 kg).Body mass index is 32.88 kg/m.  General Appearance: Fairly Groomed  Eye Contact:  Fair  Speech:  Clear and Coherent  Volume:  Normal  Mood:  Anxious managing okay  Affect:  Congruent  Thought Process:  Linear and Descriptions of Associations: Intact  Orientation:  Full (Time, Place, and Person)  Thought Content: Logical   Suicidal Thoughts:  No  Homicidal Thoughts:  No  Memory:  Immediate;   Fair Recent;   Fair Remote;   Poor  Judgement:  Fair  Insight:  Shallow  Psychomotor Activity:  Normal  Concentration:  Concentration: Fair and Attention Span: Fair  Recall:   limited  Fund of Knowledge: Fair  Language: Fair  Akathisia:  No  Handed:  Right  AIMS (if indicated): done  Assets:  Desire for Improvement Housing Social Support Transportation  ADL's:  Intact  Cognition: WNL  Sleep:  Fair   Screenings: Geneticist, molecular Office Visit from 05/25/2023 in [facility] Office Visit from 11/30/2022 in [facility] Video Visit from 06/01/2022 in [facility] Office Visit from 02/16/2022 in [facility] Office Visit from 10/20/2021 in [facility]  AIMS Total Score 0 0 0 0 0      GAD-7    Flowsheet Row Office Visit from 11/24/2023 in [facility] Office Visit from 05/25/2023 in [facility] Office Visit from 11/30/2022 in [facility] Video Visit from 06/01/2022 in [facility] Office Visit from 04/21/2021 in [facility]  Total GAD-7 Score 0 1 0 2 1      PHQ2-9    Flowsheet Row Office Visit from 11/24/2023 in [facility] Office Visit from 06/03/2023 in [facility] Office Visit from 05/25/2023 in [facility]  Clinical Support from 05/04/2023 in [facility] Office Visit from 03/18/2023 in [facility]  PHQ-2 Total Score 0 0 0 0 0  PHQ-9 Total Score -- 0 0 -- 0      Flowsheet Row Office Visit from 11/24/2023 in [facility] Office Visit from 05/25/2023 in [facility] Office Visit from 11/30/2022 in [facility]  C-SSRS RISK CATEGORY No Risk No Risk No Risk        Assessment and Plan: [name] is a 72 year old Caucasian male, divorced, lives in [place], has a history of schizophrenia, GAD, presented with concerns about memory changes as well as for medication refills, discussed assessment and plan as noted below.  Schizophrenia-stable Reported running out of Trazodone and Trileptal. Emphasized importance of adherence and risks of missed doses, including symptom exacerbation. Discussed benefits of consistent use for mood stabilization and mental health. -Olanzapine 10 mg p.o. nightly -Risperidone 2 mg p.o. nightly -Trileptal 150 mg p.o. twice daily -Patient on 2 antipsychotics aware of long-term side effects of the same.  Previous attempts to taper off did not work. - Ensure timely medication refills - Educate on medication adherence   Generalized anxiety disorder-stable Currently well-managed. -Paxil 15 mg p.o. daily   Memory Impairment-chronic Reported worsening memory issues. Memory evaluation score of 28/30 on MMSE indicates mild impairment without significant cognitive decline. Current issues do not indicate severe impairment but require monitoring. - Repeat memory evaluation if symptoms worsen - Consider  contacting neurologist Dr. Sherryll Burger. - Clarify if on Aricept which was previously prescribed by neurology.  This was discussed with sister who reports she will check and reach out to neurology.   Hypertension Blood pressure recorded at 187/87 mmHg, likely exacerbated by high sodium intake from frequent consumption of peanut butter and salted crackers. Discussed importance of reducing sodium intake to manage blood pressure and risks of uncontrolled hypertension, including stroke and heart disease. - Advise reduction of sodium intake - Monitor blood pressure regularly  General Health Maintenance No specific issues discussed beyond current medical conditions. - Schedule follow-up in six months - Repeat labs as indicated  Follow-up - Follow-up appointment in six months.   Collaboration of Care: Collaboration of Care: Other collateral information obtained from sister.  Encouraged to reach out to neurology.  Patient/Guardian was advised Release of Information must be obtained prior to any record release in order to collaborate their care with an outside provider. Patient/Guardian was advised if they have not already done so to contact the registration department to sign all necessary forms in order for [place] to release information regarding their care.   Consent: Patient/Guardian gives verbal consent for treatment and assignment of benefits for services provided during this visit. Patient/Guardian expressed understanding and agreed to proceed.   This note was generated in part or whole with voice recognition software. Voice recognition is usually quite accurate but there are transcription errors that can and very often do occur. I apologize for any typographical errors that were not detected and corrected.    Jomarie Longs, MD 11/25/2023, 8:07 AM

## 2024-01-02 ENCOUNTER — Other Ambulatory Visit: Payer: Self-pay | Admitting: Family Medicine

## 2024-01-02 DIAGNOSIS — I951 Orthostatic hypotension: Secondary | ICD-10-CM

## 2024-02-13 ENCOUNTER — Telehealth (HOSPITAL_COMMUNITY): Payer: Self-pay

## 2024-02-13 DIAGNOSIS — F411 Generalized anxiety disorder: Secondary | ICD-10-CM

## 2024-02-13 MED ORDER — PAROXETINE HCL 10 MG PO TABS
15.0000 mg | ORAL_TABLET | Freq: Every day | ORAL | 1 refills | Status: DC
Start: 2024-02-13 — End: 2024-09-04

## 2024-02-13 NOTE — Telephone Encounter (Signed)
 received fax requesting a refill on the paroxetine. pt was last seen on 12-12 next appt 6-12

## 2024-02-13 NOTE — Telephone Encounter (Signed)
I have sent Paxil to pharmacy.

## 2024-02-15 NOTE — Telephone Encounter (Signed)
 left message that rx had been sent to the pharmacy and that rx is ready for pick up.

## 2024-02-15 NOTE — Telephone Encounter (Signed)
 i have called twice and not able to get anyone to answer the phone and i am not able to leave a message.

## 2024-02-15 NOTE — Telephone Encounter (Signed)
 called pharmacy to see if pt has picked up medication. she states that it was still there and that they had left a message and sent text that it was ready for pick up. ( I will try back in a few)

## 2024-02-15 NOTE — Telephone Encounter (Signed)
 left message that rx was sent to the pharmacy and is ready for pickup.

## 2024-02-20 ENCOUNTER — Telehealth: Payer: Self-pay

## 2024-02-20 DIAGNOSIS — F209 Schizophrenia, unspecified: Secondary | ICD-10-CM

## 2024-02-20 MED ORDER — OXCARBAZEPINE 150 MG PO TABS: 150.0000 mg | ORAL_TABLET | Freq: Two times a day (BID) | ORAL | 0 refills | Status: DC

## 2024-02-20 NOTE — Telephone Encounter (Signed)
 received fax requesting a refill on the oxcarazepine 150mg . pt was last seen on 11-24-23 next appt 05-24-24

## 2024-02-21 NOTE — Telephone Encounter (Signed)
 left message that rx has been sent to the pharmacy

## 2024-02-25 ENCOUNTER — Emergency Department
Admission: EM | Admit: 2024-02-25 | Discharge: 2024-02-25 | Disposition: A | Discharge status: Home / Self Care | Attending: Emergency Medicine | Admitting: Emergency Medicine

## 2024-02-25 ENCOUNTER — Other Ambulatory Visit: Payer: Self-pay

## 2024-02-25 DIAGNOSIS — T148XXA Other injury of unspecified body region, initial encounter: Secondary | ICD-10-CM

## 2024-02-25 DIAGNOSIS — R030 Elevated blood-pressure reading, without diagnosis of hypertension: Secondary | ICD-10-CM

## 2024-02-25 DIAGNOSIS — S39012A Strain of muscle, fascia and tendon of lower back, initial encounter: Secondary | ICD-10-CM

## 2024-02-25 DIAGNOSIS — N189 Chronic kidney disease, unspecified: Secondary | ICD-10-CM | POA: Insufficient documentation

## 2024-02-25 DIAGNOSIS — X503XXA Overexertion from repetitive movements, initial encounter: Secondary | ICD-10-CM | POA: Insufficient documentation

## 2024-02-25 DIAGNOSIS — S3992XA Unspecified injury of lower back, initial encounter: Secondary | ICD-10-CM | POA: Diagnosis present

## 2024-02-25 LAB — URINALYSIS, ROUTINE W REFLEX MICROSCOPIC
Bilirubin Urine: NEGATIVE
Glucose, UA: NEGATIVE mg/dL
Hgb urine dipstick: NEGATIVE
Ketones, ur: NEGATIVE mg/dL
Leukocytes,Ua: NEGATIVE
Nitrite: NEGATIVE
Protein, ur: NEGATIVE mg/dL
Specific Gravity, Urine: 1.002 — ABNORMAL LOW (ref 1.005–1.030)
pH: 7 (ref 5.0–8.0)

## 2024-02-25 MED ORDER — LIDOCAINE 5 % EX PTCH
1.0000 | MEDICATED_PATCH | CUTANEOUS | Status: DC
  Administered 2024-02-25: 1 via TRANSDERMAL
  Filled 2024-02-25: qty 1

## 2024-02-25 MED ORDER — METHOCARBAMOL 500 MG PO TABS: 500.0000 mg | ORAL_TABLET | Freq: Three times a day (TID) | ORAL | 0 refills | Status: AC | PRN

## 2024-02-25 MED ORDER — LIDOCAINE 5 % EX PTCH: 1.0000 | MEDICATED_PATCH | Freq: Two times a day (BID) | CUTANEOUS | 0 refills | Status: AC

## 2024-02-25 NOTE — ED Notes (Signed)
 Pt in for back pain that radiates to his front lower abdominals when getting up and sitting down. Pt denies any urinary symptoms in relation to when back pain started. States he struggles with urgency and flow chronically. Pt A&Ox4. POA at bedside. Pt in NAD. Ambulatory to rm with steady gait. Denies any needs at this time.

## 2024-02-25 NOTE — ED Triage Notes (Signed)
 Pt to ED with family member who is legal guardian and POA for lower back pain since a few days ago after starting "new exercises" about 1 week ago. Pt is ambulatory with steady gait.

## 2024-02-25 NOTE — ED Provider Notes (Signed)
 [facility] Provider Note    Event Date/Time   First MD Initiated Contact with Patient 02/25/24 [phone]     (approximate)   History   Back Pain   HPI  [name] is a 73 y.o. male   presents to the ED with complaint of lower back pain for approximately 1 week.  Patient denies any urinary symptoms or previous history of kidney stones.  He states that pain began after beginning multiple exercises at home including bending over and touching his toes and multiple stretching.  No previous back injury or surgery.  No over-the-counter medications have been taken.  Patient has history of elevated blood pressure, CKD, schizophrenia, prolonged QT intervals.      Physical Exam   Triage Vital Signs: ED Triage Vitals  Encounter Vitals Group     BP 02/25/24 0835 (!) 191/96     Systolic BP Percentile --      Diastolic BP Percentile --      Pulse Rate 02/25/24 0835 69     Resp 02/25/24 0835 16     Temp 02/25/24 0835 (!) 97.5 F (36.4 C)     Temp Source 02/25/24 0835 Oral     SpO2 02/25/24 0835 100 %     Weight 02/25/24 0834 170 lb (77.1 kg)     Height 02/25/24 0834 6' (1.829 m)     Head Circumference --      Peak Flow --      Pain Score 02/25/24 0836 5     Pain Loc --      Pain Education --      Exclude from Growth Chart --     Most recent vital signs: Vitals:   02/25/24 0835 02/25/24 0932  BP: (!) 191/96 (!) 178/97  Pulse: 69 71  Resp: 16 17  Temp: (!) 97.5 F (36.4 C)   SpO2: 100% 100%     General: Awake, no distress.  CV:  Good peripheral perfusion.  Heart regular rate rhythm. Resp:  Normal effort.  Lungs clear bilaterally. Abd:  No distention.  Soft, nontender, bowel sounds present x 4 quadrants. Other:  Minimal tenderness on palpation of the lower lumbar region and paravertebral muscles bilaterally.  Patient is able to stand and ambulate without any assistance.  Straight leg raises are negative.  Good muscle strength bilaterally.   ED  Results / Procedures / Treatments   Labs (all labs ordered are listed, but only abnormal results are displayed) Labs Reviewed  URINALYSIS, ROUTINE W REFLEX MICROSCOPIC - Abnormal; Notable for the following components:      Result Value   Color, Urine STRAW (*)    APPearance CLEAR (*)    Specific Gravity, Urine 1.002 (*)    All other components within normal limits       PROCEDURES:  Critical Care performed:   Procedures   MEDICATIONS ORDERED IN ED: Medications  lidocaine (LIDODERM) 5 % 1 patch (has no administration in time range)     IMPRESSION / MDM / ASSESSMENT AND PLAN / ED COURSE  I reviewed the triage vital signs and the nursing notes.   Differential diagnosis includes, but is not limited to, lumbar strain, musculoskeletal pain, urinary tract infection, urolithiasis.  73 year old male presents to the ED with complaint of low back pain for approximately 1 week after starting a exercise routine.  No actual injury and patient is continue to be ambulatory without any assistance.  No over-the-counter medications have been taken.  Urinalysis was clear and reassuring.  A Lidoderm patch was applied to his back with a prescription for the same sent to his pharmacy.  He also requested a muscle relaxant and methocarbamol was sent to the pharmacy for him to take 1 every 8 hours for the next 2 days.  He is instructed to discontinue exercises at this time until his back is completely better.  Also to follow-up with Dr. Sherrie Mustache who is his PCP for recheck of his back and also repeat blood pressure check as it was elevated in the emergency department.      Patient's presentation is most consistent with acute complicated illness / injury requiring diagnostic workup.  FINAL CLINICAL IMPRESSION(S) / ED DIAGNOSES   Final diagnoses:  Muscle strain  Strain of lumbar paraspinous muscle, initial encounter  Elevated blood pressure reading     Rx / DC Orders   ED Discharge Orders           Ordered    lidocaine (LIDODERM) 5 %  Every 12 hours        02/25/24 0924    methocarbamol (ROBAXIN) 500 MG tablet  Every 8 hours PRN        02/25/24 0934             Note:  This document was prepared using Dragon voice recognition software and may include unintentional dictation errors.   Tommi Rumps, PA-C 02/25/24 4098    Jene Every, MD 02/25/24 1213

## 2024-02-25 NOTE — Discharge Instructions (Addendum)
 Call make an appointment with Dr. Sherrie Mustache if any continued problems with your back and also to have your blood pressure rechecked as it was elevated in the emergency department.  A prescription for the lidocaine Derm patches and methocarbamol were sent to your pharmacy to continue using.  Be aware that the methocarbamol could cause drowsiness and increase your risk for falling.  Warm moist compresses to your back to help your muscles relax and also discontinue exercises at this time and to your back is completely better.

## 2024-03-24 ENCOUNTER — Other Ambulatory Visit: Payer: Self-pay | Admitting: Psychiatry

## 2024-03-24 DIAGNOSIS — F209 Schizophrenia, unspecified: Secondary | ICD-10-CM

## 2024-03-24 DIAGNOSIS — F411 Generalized anxiety disorder: Secondary | ICD-10-CM

## 2024-04-09 ENCOUNTER — Other Ambulatory Visit: Payer: Self-pay | Admitting: Family Medicine

## 2024-04-09 DIAGNOSIS — I951 Orthostatic hypotension: Secondary | ICD-10-CM

## 2024-04-20 ENCOUNTER — Ambulatory Visit: Payer: Self-pay

## 2024-04-20 NOTE — Telephone Encounter (Signed)
 Copied from CRM [id number]. Topic: Clinical - Red Word Triage >> Apr 20, 2024 12:22 PM Sasha H wrote: Red Word that prompted transfer to Nurse Triage: Pt's sister called in stating that pt's left leg is very swollen and looks as if his circulation to his foot is being cut off, states his foot is purple.   Chief Complaint: foot swelling Symptoms: left foot swelling Frequency: constant Pertinent Negatives: Patient denies pain Disposition: [] ED /[] Urgent Care (no appt availability in office) / [] Appointment(In office/virtual)/ []  [place] [facility]/ [x] Home Care/ [] Refused Recommended Disposition /[] [place] [facility]/ []  Follow-up with PCP Additional Notes: spoke with sister Chesley Ferrare who reports left lower leg swelling, mary states that patient has on a very tight sock and his leg was turning purple. RN advising that patient remove the sock, patient removed sock while on this call, and per [facility] patient leg was retuning to normal color. Mary asking to hold off on scheduling for now, wants to watch and monitor patients foot and will call back if swelling persist.  Reason for Disposition  [1] Localized swelling (e.g., small area of puffy or swollen skin) AND [2] itchy  Answer Assessment - Initial Assessment Questions 1. ONSET: "When did the swelling start?" (e.g., minutes, hours, days)     Unsure of when swelling started  2. LOCATION: "What part of the leg is swollen?"  "Are both legs swollen or just one leg?"     Left lower leg  3. SEVERITY: "How bad is the swelling?" (e.g., localized; mild, moderate, severe)   - Localized: Small area of swelling localized to one leg.   - MILD pedal edema: Swelling limited to foot and ankle, pitting edema < 1/4 inch (6 mm) deep, rest and elevation eliminate most or all swelling.   - MODERATE edema: Swelling of lower leg to knee, pitting edema > 1/4 inch (6 mm) deep, rest and elevation only partially reduce swelling.   - SEVERE edema: Swelling extends  above knee, facial or hand swelling present.      Moderate swelling worse when sitting down  4. REDNESS: "Does the swelling look red or infected?"     Some redness seen  5. PAIN: "Is the swelling painful to touch?" If Yes, ask: "How painful is it?"   (Scale 1-10; mild, moderate or severe)     No pain  6. FEVER: "Do you have a fever?" If Yes, ask: "What is it, how was it measured, and when did it start?"      No  7. CAUSE: "What do you think is causing the leg swelling?"     Sister reports that patient has been wearing tight socks, but one of his legs looks bigger  8. MEDICAL HISTORY: "Do you have a history of blood clots (e.g., DVT), cancer, heart failure, kidney disease, or liver failure?"     No  9. RECURRENT SYMPTOM: "Have you had leg swelling before?" If Yes, ask: "When was the last time?" "What happened that time?"     Yes, but stopped wearing his socks and it got better  10. OTHER SYMPTOMS: "Do you have any other symptoms?" (e.g., chest pain, difficulty breathing)       No  Protocols used: Leg Swelling and Edema-A-AH

## 2024-05-14 ENCOUNTER — Ambulatory Visit: Payer: Medicare Other

## 2024-05-14 ENCOUNTER — Ambulatory Visit: Payer: Self-pay

## 2024-05-14 VITALS — Ht 72.0 in | Wt 240.0 lb

## 2024-05-14 DIAGNOSIS — Z0000 Encounter for general adult medical examination without abnormal findings: Secondary | ICD-10-CM | POA: Diagnosis not present

## 2024-05-14 NOTE — Progress Notes (Signed)
 Because this visit was a virtual/telehealth visit,  certain criteria was not obtained, such a blood pressure, CBG if applicable, and timed get up and go. Any medications not marked as "taking" were not mentioned during the medication reconciliation part of the visit. Any vitals not documented were not able to be obtained due to this being a telehealth visit or patient was unable to self-report a recent blood pressure reading due to a lack of equipment at home via telehealth. Vitals that have been documented are verbally provided by the patient.   Subjective:   [name] is a 73 y.o. who presents for a Medicare Wellness preventive visit.  As a reminder, Annual Wellness Visits don't include a physical exam, and some assessments may be limited, especially if this visit is performed virtually. We may recommend an in-person follow-up visit with your provider if needed.  Visit Complete: Virtual I connected with  [name] on 05/14/24 by a audio enabled telemedicine application and verified that I am speaking with the correct person using two identifiers.  Patient Location: Home  Provider Location: Office/Clinic  I discussed the limitations of evaluation and management by telemedicine. The patient expressed understanding and agreed to proceed.  Vital Signs: Because this visit was a virtual/telehealth visit, some criteria may be missing or patient reported. Any vitals not documented were not able to be obtained and vitals that have been documented are patient reported.  VideoDeclined- This patient declined Librarian, academic. Therefore the visit was completed with audio only.  Persons Participating in Visit: Patient assisted by sister, [name] (legal guardian).  AWV Questionnaire: No: Patient Medicare AWV questionnaire was not completed prior to this visit.  Cardiac Risk Factors include: advanced age (>8men, >51 women);sedentary  lifestyle;dyslipidemia;hypertension;male gender;family history of premature cardiovascular disease;obesity (BMI >30kg/m2)     Objective:     Today's Vitals   05/14/24 1505 05/14/24 1506  Weight: 240 lb (108.9 kg)   Height: 6' (1.829 m)   PainSc: 0-No pain 0-No pain   Body mass index is 32.55 kg/m.     05/14/2024    3:07 PM 02/25/2024    8:36 AM 05/04/2023    3:55 PM 11/26/2022    6:26 AM 03/10/2022    1:05 PM 01/23/2019    8:00 PM 01/23/2019   12:50 PM  Advanced Directives  Does Patient Have a Medical Advance Directive? Yes Yes Yes Yes No No No  Type of Estate agent of [place];Living will Healthcare Power of [employer] Power of [employer] Power of Attorney     Does patient want to make changes to medical advance directive?    No - Patient declined     Copy of Healthcare Power of Attorney in Chart? No - copy requested   Yes - validated most recent copy scanned in chart (See row information)     Would patient like information on creating a medical advance directive?     No - Patient declined No - Patient declined     Current Medications (verified) Outpatient Encounter Medications as of 05/14/2024  Medication Sig   donepezil (ARICEPT) 5 MG tablet Take by mouth.   fludrocortisone  (FLORINEF ) 0.1 MG tablet Take 1 tablet (100 mcg total) by mouth 2 (two) times daily.   lidocaine  (LIDODERM ) 5 % Place 1 patch onto the skin every 12 (twelve) hours. Remove & Discard patch within 12 hours or as directed by MD   lubiprostone  (AMITIZA ) 24 MCG capsule Take 1 capsule (  24 mcg total) by mouth 2 (two) times daily with a meal.   methocarbamol  (ROBAXIN ) 500 MG tablet Take 1 tablet (500 mg total) by mouth every 8 (eight) hours as needed for muscle spasms.   midodrine  (PROAMATINE ) 2.5 MG tablet TAKE 1 TABLET BY MOUTH TWICE A DAY   OLANZapine  (ZYPREXA ) 10 MG tablet TAKE 1 TABLET BY MOUTH AT BEDTIME   OVER THE COUNTER MEDICATION [place] for constipation   OXcarbazepine   (TRILEPTAL ) 150 MG tablet Take 1 tablet (150 mg total) by mouth 2 (two) times daily.   PARoxetine  (PAXIL ) 10 MG tablet Take 1.5 tablets (15 mg total) by mouth daily.   pravastatin  (PRAVACHOL ) 40 MG tablet TAKE 1 TABLET BY MOUTH DAILY   Psyllium (METAMUCIL) 0.36 g CAPS Take by mouth.   risperiDONE  (RISPERDAL ) 2 MG tablet TAKE 1 TABLET BY MOUTH AT BEDTIME   No facility-administered encounter medications on file as of 05/14/2024.    Allergies (verified) Vitamin b12   History: Past Medical History:  Diagnosis Date   Chicken pox    Dizziness    Family history of adverse reaction to anesthesia    Measles    Mumps    PONV (postoperative nausea and vomiting)    Schizophrenia (HCC)    Past Surgical History:  Procedure Laterality Date   ABDOMINAL SURGERY     CATARACT EXTRACTION W/PHACO Right 11/26/2022   Procedure: CATARACT EXTRACTION PHACO AND INTRAOCULAR LENS PLACEMENT (IOC) RIGHT;  Surgeon: Trudi Fus, MD;  Location: [facility];  Service: Ophthalmology;  Laterality: Right;  24.46 2:03.1   COLON SURGERY     LAPAROTOMY N/A 03/20/2016   Procedure: EXPLORATORY LAPAROTOMY with lysis of adhesions;  Surgeon: Alben Alma, MD;  Location: [facility];  Service: General;  Laterality: N/A;   None     Family History  Problem Relation Age of Onset   Diabetes Mother    Heart disease Mother    Stroke Mother    Diabetes Father    Hypertension Father    Lung cancer Father    Cancer Father        lung cancer   Schizophrenia Sister    Lung cancer Sister    Colon cancer Sister    Social History   Socioeconomic History   Marital status: Divorced    Spouse name: Not on file   Number of children: 2   Years of education: Not on file   Highest education level: High school graduate  Occupational History   Occupation: Disabled  Tobacco Use   Smoking status: Former    Current packs/day: 0.00    Types: Cigarettes    Quit date: 08/04/2004    Years since quitting: 19.7    Smokeless tobacco: Former    Quit date: 08/04/2004  Vaping Use   Vaping status: Never Used  Substance and Sexual Activity   Alcohol use: No    Alcohol/week: 0.0 standard drinks of alcohol   Drug use: No   Sexual activity: Not Currently  Other Topics Concern   Not on file  Social History Narrative   Not on file   Social Drivers of Health   Financial Resource Strain: Low Risk  (05/14/2024)   Overall Financial Resource Strain (CARDIA)    Difficulty of Paying Living Expenses: Not hard at all  Food Insecurity: No Food Insecurity (05/14/2024)   Hunger Vital Sign    Worried About Running Out of Food in the Last Year: Never true    Ran Out of Food  in the Last Year: Never true  Transportation Needs: No Transportation Needs (05/14/2024)   PRAPARE - Administrator, Civil Service (Medical): No    Lack of Transportation (Non-Medical): No  Physical Activity: Insufficiently Active (05/14/2024)   Exercise Vital Sign    Days of Exercise per Week: 3 days    Minutes of Exercise per Session: 30 min  Stress: No Stress Concern Present (05/14/2024)   [employer] of Occupational Health - Occupational Stress Questionnaire    Feeling of Stress : Not at all  Social Connections: Moderately Isolated (05/14/2024)   Social Connection and Isolation Panel [NHANES]    Frequency of Communication with Friends and Family: Twice a week    Frequency of Social Gatherings with Friends and Family: Once a week    Attends Religious Services: 1 to 4 times per year    Active Member of [employer] or Organizations: No    Attends Engineer, structural: Never    Marital Status: Divorced    Tobacco Counseling Counseling given: Not Answered    Clinical Intake:  Pre-visit preparation completed: Yes  Pain : No/denies pain Pain Score: 0-No pain     BMI - recorded: 32.55 Nutritional Status: BMI > 30  Obese Nutritional Risks: None Diabetes: No  Lab Results  Component Value Date   HGBA1C 5.7 (H)  03/18/2023   HGBA1C 5.3 11/07/2020     How often do you need to have someone help you when you read instructions, pamphlets, or other written materials from your doctor or pharmacy?: 1 - Never  Interpreter Needed?: No  Information entered by :: Vaani Morren N. Towanna Avery, LPN.   Activities of Daily Living     05/14/2024    3:10 PM 06/03/2023   10:04 AM  In your present state of health, do you have any difficulty performing the following activities:  Hearing? 0 0  Vision? 0 0  Difficulty concentrating or making decisions? 1 1  Walking or climbing stairs? 0 0  Dressing or bathing? 0 0  Doing errands, shopping? 1 0  Comment NO LICENSE   Preparing Food and eating ? N   Using the Toilet? N   In the past six months, have you accidently leaked urine? N   Do you have problems with loss of bowel control? N   Managing your Medications? N   Managing your Finances? Y   Housekeeping or managing your Housekeeping? N     Patient Care Team: Lamon Pillow, MD as PCP - General (Family Medicine) Vergil Glasser, MD as Consulting Physician (Psychiatry) Rosan Comfort, MD as Consulting Physician (Neurology) Pa, [facility] as Referring Physician (Ophthalmology)  I have updated your Care Teams any recent Medical Services you may have received from other providers in the past year.     Assessment:    This is a routine wellness examination for [name].  Hearing/Vision screen Hearing Screening - Comments:: Patient has adequate hearing. Vision Screening - Comments:: Patient has adequate vision.  Lens implants corrected vision. Patient goes to [facility]   Goals Addressed             This Visit's Progress    05/14/2024: To maintain my health by keeping my appointmements.         Depression Screen     05/14/2024    3:11 PM 11/24/2023   11:20 AM 06/03/2023   10:04 AM 05/25/2023   10:32 AM 05/04/2023    3:51 PM 03/18/2023  10:39 AM 11/30/2022    2:42 PM  PHQ 2/9 Scores  PHQ -  2 Score 0  0  0 0   PHQ- 9 Score 2  0   0      Information is confidential and restricted. Go to Review Flowsheets to unlock data.    Fall Risk     05/14/2024    3:08 PM 06/03/2023   10:04 AM 05/04/2023    3:48 PM 03/18/2023   10:38 AM 03/10/2022    1:06 PM  Fall Risk   Falls in the past year? 0 0 0 0 0  Number falls in past yr: 0 0 0  0  Injury with Fall? 0 0 0 1 0  Risk for fall due to : No Fall Risks No Fall Risks No Fall Risks History of fall(s) No Fall Risks  Follow up Falls evaluation completed Falls evaluation completed Education provided;Falls prevention discussed Falls evaluation completed Falls evaluation completed    MEDICARE RISK AT HOME:  Medicare Risk at Home Any stairs in or around the home?: Yes (FRONT ENTRY WITH HANDRAILS) If so, are there any without handrails?: No Home free of loose throw rugs in walkways, pet beds, electrical cords, etc?: Yes Adequate lighting in your home to reduce risk of falls?: Yes Life alert?: No Use of a cane, walker or w/c?: No Grab bars in the bathroom?: Yes Shower chair or bench in shower?: No Elevated toilet seat or a handicapped toilet?: Yes  TIMED UP AND GO:  Was the test performed?  No  Cognitive Function: Impaired: Patient has current diagnosis of cognitive impairment.    05/14/2024    3:10 PM  MMSE - Mini Mental State Exam  Not completed: Unable to complete        05/04/2023    3:58 PM 09/30/2020    3:03 PM  6CIT Screen  What Year? 0 points 0 points  What month? 0 points 0 points  What time? 0 points 0 points  Count back from 20 0 points 0 points  Months in reverse 0 points 0 points  Repeat phrase 0 points 10 points  Total Score 0 points 10 points    Immunizations Immunization History  Administered Date(s) Administered   Fluad Quad(high Dose 65+) 09/30/2020   Influenza Split 09/01/2011   Influenza, High Dose Seasonal PF 09/20/2016   Moderna Sars-Covid-2 Vaccination 06/08/2020, 07/06/2020   Pneumococcal  Conjugate-13 05/16/2015   Pneumococcal Polysaccharide-23 09/20/2016    Screening Tests Health Maintenance  Topic Date Due   DTaP/Tdap/Td (1 - Tdap) Never done   Zoster Vaccines- Shingrix (1 of 2) Never done   Colonoscopy  12/02/2019   COVID-19 Vaccine (3 - Moderna risk series) 08/03/2020   INFLUENZA VACCINE  07/13/2024   Medicare Annual Wellness (AWV)  05/14/2025   Pneumonia Vaccine 39+ Years old  Completed   Hepatitis C Screening  Completed   HPV VACCINES  Aged Out   Meningococcal B Vaccine  Aged Out    Health Maintenance  Health Maintenance Due  Topic Date Due   DTaP/Tdap/Td (1 - Tdap) Never done   Zoster Vaccines- Shingrix (1 of 2) Never done   Colonoscopy  12/02/2019   COVID-19 Vaccine (3 - Moderna risk series) 08/03/2020   Health Maintenance Items Addressed: Yes Patient's legal guardian aware of current care gaps. Immunization record was verified by [employer].  Additional Screening:  Vision Screening: Recommended annual ophthalmology exams for early detection of glaucoma and other disorders of the eye. Would you  like a referral to an eye doctor? No    Dental Screening: Recommended annual dental exams for proper oral hygiene  Community Resource Referral / Chronic Care Management: CRR required this visit?  No   CCM required this visit?  No   Plan:    I have personally reviewed and noted the following in the patient's chart:   Medical and social history Use of alcohol, tobacco or illicit drugs  Current medications and supplements including opioid prescriptions. Patient is not currently taking opioid prescriptions. Functional ability and status Nutritional status Physical activity Advanced directives List of other physicians Hospitalizations, surgeries, and ER visits in previous 12 months Vitals Screenings to include cognitive, depression, and falls Referrals and appointments  In addition, I have reviewed and discussed with patient certain preventive  protocols, quality metrics, and best practice recommendations. A written personalized care plan for preventive services as well as general preventive health recommendations were provided to patient.   Margette Sheldon, LPN   12/18/1094   After Visit Summary: (MyChart) Due to this being a telephonic visit, the after visit summary with patients personalized plan was offered to patient via MyChart   Nurse Notes: Patient's legal guardian aware of current care gaps.  Patient declined all vaccines and Colonoscopy due to age.

## 2024-05-14 NOTE — Telephone Encounter (Signed)
 Information obtained from sister, Adriana Hopping. Chief Complaint: L leg swelling Symptoms: purple foot, L leg swelling Frequency: a few weeks Pertinent Negatives: Patient denies injury, denies cold foot Disposition: [x] ED /[] Urgent Care (no appt availability in office) / [] Appointment(In office/virtual)/ []  [place] [facility]/ [] Home Care/ [] Refused Recommended Disposition /[] [place] [facility]/ []  Follow-up with PCP Additional Notes: L leg is swollen and is purple. Sister states that seems better after he mowed the lawn for about 2 hours. Pt states that the foot is not cool to touch. Pt and sister note that the foot is purple in color. Denies possible injury. Advised to go to the ED. Copied from CRM [id number]. Topic: Clinical - Red Word Triage >> May 14, 2024  2:13 PM Fredrica W wrote: Red Word that prompted transfer to Nurse Triage: left leg swelling, foot changing color Reason for Disposition  Purple or black skin on foot or toe  Answer Assessment - Initial Assessment Questions 1. ONSET: "When did the pain start?"      A few weeks ago 2. LOCATION: "Where is the pain located?"      L leg 3. PAIN: "How bad is the pain?"    (Scale 1-10; or mild, moderate, severe)  - MILD (1-3): doesn't interfere with normal activities.   - MODERATE (4-7): interferes with normal activities (e.g., work or school) or awakens from sleep, limping.   - SEVERE (8-10): excruciating pain, unable to do any normal activities, unable to walk.      Denies pain in foot 4. WORK OR EXERCISE: "Has there been any recent work or exercise that involved this part of the body?"      denies 5. CAUSE: "What do you think is causing the foot pain?"     unsure 6. OTHER SYMPTOMS: "Do you have any other symptoms?" (e.g., leg pain, rash, fever, numbness)     Leg swelling  Protocols used: Foot Pain-A-AH

## 2024-05-14 NOTE — Patient Instructions (Signed)
 [name] , Thank you for taking time out of your busy schedule to complete your Annual Wellness Visit with me. I enjoyed our conversation and look forward to speaking with you again next year. I, as well as your care team,  appreciate your ongoing commitment to your health goals. Please review the following plan we discussed and let me know if I can assist you in the future. Your Game plan/ To Do List    Referrals: If you haven't heard from the office you've been referred to, please reach out to them at the phone provided.   Follow up Visits: Next Medicare AWV with our clinical staff: 05/22/2025 at 3:10 p.m. Phone Visit with Nurse Health Advisor   Have you seen your provider in the last 6 months (3 months if uncontrolled diabetes)? Yes Next Office Visit with your provider: Patient has been advised to go to ER for follow up on swelling of lower extremity.  Clinician Recommendations:  Aim for 30 minutes of exercise or brisk walking, 6-8 glasses of water, and 5 servings of fruits and vegetables each day.       This is a list of the screening recommended for you and due dates:  Health Maintenance  Topic Date Due   DTaP/Tdap/Td vaccine (1 - Tdap) Never done   Zoster (Shingles) Vaccine (1 of 2) Never done   Colon Cancer Screening  12/02/2019   COVID-19 Vaccine (3 - Moderna risk series) 08/03/2020   Flu Shot  07/13/2024   Medicare Annual Wellness Visit  05/14/2025   Pneumonia Vaccine  Completed   Hepatitis C Screening  Completed   HPV Vaccine  Aged Out   Meningitis B Vaccine  Aged Out    Advanced directives: (Copy Requested) Please bring a copy of your health care power of attorney and living will to the office to be added to your chart at your convenience. You can mail to [facility] [address] [place], [place] [address] or email to [email or web address] Advance Care Planning is important because it:  [x]  Makes sure you receive the medical care that is consistent  with your values, goals, and preferences  [x]  It provides guidance to your family and loved ones and reduces their decisional burden about whether or not they are making the right decisions based on your wishes.  Follow the link provided in your after visit summary or read over the paperwork we have mailed to you to help you started getting your Advance Directives in place. If you need assistance in completing these, please reach out to us  so that we can help you!  See attachments for Preventive Care and Fall Prevention Tips.

## 2024-05-15 ENCOUNTER — Emergency Department

## 2024-05-15 ENCOUNTER — Other Ambulatory Visit: Payer: Self-pay

## 2024-05-15 ENCOUNTER — Emergency Department
Admission: EM | Admit: 2024-05-15 | Discharge: 2024-05-15 | Disposition: A | Discharge status: Home / Self Care | Attending: Emergency Medicine | Admitting: Emergency Medicine

## 2024-05-15 ENCOUNTER — Encounter: Payer: Self-pay | Admitting: Emergency Medicine

## 2024-05-15 DIAGNOSIS — R600 Localized edema: Secondary | ICD-10-CM | POA: Diagnosis not present

## 2024-05-15 DIAGNOSIS — M7989 Other specified soft tissue disorders: Secondary | ICD-10-CM | POA: Diagnosis not present

## 2024-05-15 DIAGNOSIS — I129 Hypertensive chronic kidney disease with stage 1 through stage 4 chronic kidney disease, or unspecified chronic kidney disease: Secondary | ICD-10-CM | POA: Insufficient documentation

## 2024-05-15 DIAGNOSIS — N189 Chronic kidney disease, unspecified: Secondary | ICD-10-CM | POA: Insufficient documentation

## 2024-05-15 DIAGNOSIS — R2242 Localized swelling, mass and lump, left lower limb: Secondary | ICD-10-CM | POA: Diagnosis not present

## 2024-05-15 NOTE — ED Triage Notes (Signed)
 Patient to ED via POV for left leg swelling. States on going for a couple weeks. Family reports it looks like "knots" in his legs. Ambulatory to triage.

## 2024-05-15 NOTE — Discharge Instructions (Signed)
 You were seen in the emergency department for left leg swelling.  You had an ultrasound that did not show a blood clot in your left leg.  You had a good pulse and good blood flow to your left foot.  Get good compression socks and wear at all times especially whenever you are sitting for long period of time.  Call your primary care physician today to schedule a close follow-up appointment.  Return to the emergency department if you have worsening symptoms.

## 2024-05-15 NOTE — ED Provider Notes (Signed)
 [facility] Provider Note    Event Date/Time   First MD Initiated Contact with Patient 05/15/24 0840     (approximate)   History   Leg Swelling   HPI  [name] is a 73 y.o. male past medical history significant for hypertension, CKD, schizophrenia, presents to the emergency department with left leg swelling.  Endorses ongoing left leg swelling.  Denies any falls or trauma.  No history of DVT or PE.  Denies any wounds on his foot but was concerned that he might have bad circulation.  Denies any prior evaluation by vascular surgery.  Denies fever or chills.  No chest pain or shortness of breath.  No history of diabetes.  Does endorse sitting for most of the day.  No significant swelling to the right leg.     Physical Exam   Triage Vital Signs: ED Triage Vitals [05/15/24 0829]  Encounter Vitals Group     BP (!) 167/87     Systolic BP Percentile      Diastolic BP Percentile      Pulse Rate 66     Resp 17     Temp 97.8 F (36.6 C)     Temp Source Oral     SpO2 100 %     Weight 241 lb (109.3 kg)     Height 6' (1.829 m)     Head Circumference      Peak Flow      Pain Score 0     Pain Loc      Pain Education      Exclude from Growth Chart     Most recent vital signs: Vitals:   05/15/24 0829  BP: (!) 167/87  Pulse: 66  Resp: 17  Temp: 97.8 F (36.6 C)  SpO2: 100%    Physical Exam Constitutional:      Appearance: He is well-developed.  HENT:     Head: Atraumatic.  Eyes:     Conjunctiva/sclera: Conjunctivae normal.  Cardiovascular:     Rate and Rhythm: Regular rhythm.  Pulmonary:     Effort: No respiratory distress.  Musculoskeletal:     Cervical back: Normal range of motion.     Right lower leg: No edema.     Left lower leg: Edema present.     Comments: Left lower extremity with pitting edema 2+ when compared to the right leg with only trace pitting edema.  +2 DP pulses that are equal bilaterally.  Good capillary refill to  all 5 toes.  Sensation is intact.  Both feet are warm to touch.  Skin:    General: Skin is warm.     Capillary Refill: Capillary refill takes less than 2 seconds.     Findings: No rash.     Comments: No wound  Neurological:     Mental Status: He is alert. Mental status is at baseline.      IMPRESSION / MDM / ASSESSMENT AND PLAN / ED COURSE  I reviewed the triage vital signs and the nursing notes.  Differential diagnosis including DVT, chronic venous stasis.  Low suspicion for peripheral arterial disease, has good and equal pulses to both feet with good capillary refill and sensation.  No findings consistent with a cellulitis with no overlying erythema or warmth.  Full flexion extension of the knee and the ankle and have a low suspicion for septic joint.  No open wounds to the foot.  No chest pain or shortness of breath, have  low suspicion for heart failure exacerbation or new onset heart failure   RADIOLOGY Ultrasound DVT to the left lower extremity -no findings of acute DVT   Labs (all labs ordered are listed, but only abnormal results are displayed) Labs interpreted as -    Labs Reviewed - No data to display    DVT ultrasound is negative.  On reevaluation continues to have good pulses.  No pain to his lower extremities and have a low suspicion for peripheral arterial disease.  Discussed compression socks.  Discussed close follow-up with primary care physician this week and return precautions to the emergency department.  No questions at time of discharge.   PROCEDURES:  Critical Care performed: No  Procedures  Patient's presentation is most consistent with acute complicated illness / injury requiring diagnostic workup.   MEDICATIONS ORDERED IN ED: Medications - No data to display  FINAL CLINICAL IMPRESSION(S) / ED DIAGNOSES   Final diagnoses:  Left leg swelling     Rx / DC Orders   ED Discharge Orders     None        Note:  This document was prepared  using Dragon voice recognition software and may include unintentional dictation errors.   Viviano Ground, MD 05/15/24 1056

## 2024-05-21 ENCOUNTER — Other Ambulatory Visit: Payer: Self-pay | Admitting: Psychiatry

## 2024-05-21 DIAGNOSIS — F209 Schizophrenia, unspecified: Secondary | ICD-10-CM

## 2024-05-24 ENCOUNTER — Ambulatory Visit (INDEPENDENT_AMBULATORY_CARE_PROVIDER_SITE_OTHER): Payer: Self-pay | Admitting: Psychiatry

## 2024-05-24 ENCOUNTER — Encounter: Payer: Self-pay | Admitting: Psychiatry

## 2024-05-24 VITALS — BP 142/90 | HR 76 | Temp 98.8°F | Ht 72.0 in | Wt 236.4 lb

## 2024-05-24 DIAGNOSIS — F209 Schizophrenia, unspecified: Secondary | ICD-10-CM

## 2024-05-24 DIAGNOSIS — F411 Generalized anxiety disorder: Secondary | ICD-10-CM

## 2024-05-24 DIAGNOSIS — Z79899 Other long term (current) drug therapy: Secondary | ICD-10-CM

## 2024-05-24 NOTE — Progress Notes (Signed)
 [facility] MD OP Progress Note  05/25/2024 11:23 AM [name]  MRN:  [id number]  Chief Complaint:  Chief Complaint  Patient presents with   Follow-up   Schizophrenia   Medication Refill   HPI: [name] is a 73 year old Caucasian male, divorced, lives in [place], has a history of schizophrenia, GAD was evaluated in office today.He is accompanied by Mary,his sister.  He has experienced swelling in his leg since early June, which led to a visit to the emergency department.He has a history of high blood pressure, which he believes is related to his high sodium diet, including cookies and bread. He consumes a significant amount of sweets and bread, often eating three sandwiches at a time, multiple times a day. Despite this, he maintains a weight of approximately 236 pounds, consistent with his weight from the previous year. He engages in physical activity by mowing the yard with a push mower.  He experiences occasional numbness in his fingers, primarily on the left hand, which he manages by working his fingers to restore sensation. This numbness occurs sporadically and does not last long. No muscle spasms, rigidity, or tremors are reported, and the numbness occurs when he is up and walking around.  He denies any current symptoms of anxiety, depression.  Denies any psychosis, did not appear to be preoccupied with any delusions or paranoia.  As per collateral information obtained from sister he has not had any episodes of mood swings, agitation or irritability.  He is compliant with his medications.  Denies side effects.  He appeared to be alert, oriented to person place time situation.  Attention and focus seem to be limited although memory seem to be intact, tested by 3 word memory recall.  According to sister he is able to take care of himself, as well as take his medications regularly without much support from her.  Denies any suicidality, homicidality.  Denies any other concerns  today.  Visit Diagnosis:    ICD-10-CM   1. Schizophrenia in full remission with history of multiple episodes (HCC)  F20.9     2. GAD (generalized anxiety disorder)  F41.1     3. High risk medication use  Z79.899 Lipid panel    Hemoglobin A1c    Prolactin    TSH      Past Psychiatric History: I have reviewed past psychiatric history from progress note on 02/23/2018.  Past trials of Haldol , Klonopin , Seroquel  Past Medical History:  Past Medical History:  Diagnosis Date   Chicken pox    Dizziness    Family history of adverse reaction to anesthesia    Measles    Mumps    PONV (postoperative nausea and vomiting)    Schizophrenia (HCC)     Past Surgical History:  Procedure Laterality Date   ABDOMINAL SURGERY     CATARACT EXTRACTION W/PHACO Right 11/26/2022   Procedure: CATARACT EXTRACTION PHACO AND INTRAOCULAR LENS PLACEMENT (IOC) RIGHT;  Surgeon: Trudi Fus, MD;  Location: [facility];  Service: Ophthalmology;  Laterality: Right;  24.46 2:03.1   COLON SURGERY     LAPAROTOMY N/A 03/20/2016   Procedure: EXPLORATORY LAPAROTOMY with lysis of adhesions;  Surgeon: Alben Alma, MD;  Location: [facility];  Service: General;  Laterality: N/A;   None      Family Psychiatric History: I have reviewed family psychiatric history from progress note on 02/23/2018.  Family History:  Family History  Problem Relation Age of Onset   Diabetes Mother  Heart disease Mother    Stroke Mother    Diabetes Father    Hypertension Father    Lung cancer Father    Cancer Father        lung cancer   Schizophrenia Sister    Lung cancer Sister    Colon cancer Sister     Social History: I have reviewed social history from progress note on 02/23/2018. Social History   Socioeconomic History   Marital status: Divorced    Spouse name: Not on file   Number of children: 2   Years of education: Not on file   Highest education level: High school graduate  Occupational History    Occupation: Disabled  Tobacco Use   Smoking status: Former    Current packs/day: 0.00    Types: Cigarettes    Quit date: 08/04/2004    Years since quitting: 19.8   Smokeless tobacco: Former    Quit date: 08/04/2004  Vaping Use   Vaping status: Never Used  Substance and Sexual Activity   Alcohol use: No    Alcohol/week: 0.0 standard drinks of alcohol   Drug use: No   Sexual activity: Not Currently  Other Topics Concern   Not on file  Social History Narrative   Not on file   Social Drivers of Health   Financial Resource Strain: Low Risk  (05/14/2024)   Overall Financial Resource Strain (CARDIA)    Difficulty of Paying Living Expenses: Not hard at all  Food Insecurity: No Food Insecurity (05/14/2024)   Hunger Vital Sign    Worried About Running Out of Food in the Last Year: Never true    Ran Out of Food in the Last Year: Never true  Transportation Needs: No Transportation Needs (05/14/2024)   PRAPARE - Administrator, Civil Service (Medical): No    Lack of Transportation (Non-Medical): No  Physical Activity: Insufficiently Active (05/14/2024)   Exercise Vital Sign    Days of Exercise per Week: 3 days    Minutes of Exercise per Session: 30 min  Stress: No Stress Concern Present (05/14/2024)   [employer] of Occupational Health - Occupational Stress Questionnaire    Feeling of Stress : Not at all  Social Connections: Moderately Isolated (05/14/2024)   Social Connection and Isolation Panel    Frequency of Communication with Friends and Family: Twice a week    Frequency of Social Gatherings with Friends and Family: Once a week    Attends Religious Services: 1 to 4 times per year    Active Member of [employer] or Organizations: No    Attends Banker Meetings: Never    Marital Status: Divorced    Allergies:  Allergies  Allergen Reactions   Vitamin B12 Other (See Comments)    interacted with other medications  interacted with other medications     Metabolic  Disorder Labs: Lab Results  Component Value Date   HGBA1C 5.7 (H) 03/18/2023   MPG 105.41 11/07/2020   Lab Results  Component Value Date   PROLACTIN 13.0 03/18/2023   PROLACTIN 17.3 (H) 11/07/2020   Lab Results  Component Value Date   CHOL 156 03/18/2023   TRIG 129 03/18/2023   HDL 48 03/18/2023   CHOLHDL 3.3 03/18/2023   LDLCALC 85 03/18/2023   LDLCALC 105 (H) 10/07/2020   Lab Results  Component Value Date   TSH 2.200 03/18/2023   TSH 2.420 06/14/2019    Therapeutic Level Labs: No results found for: LITHIUM No results found  for: VALPROATE No results found for: CBMZ  Current Medications: Current Outpatient Medications  Medication Sig Dispense Refill   donepezil (ARICEPT) 5 MG tablet Take by mouth.     fludrocortisone  (FLORINEF ) 0.1 MG tablet Take 1 tablet (100 mcg total) by mouth 2 (two) times daily. 180 tablet 2   lidocaine  (LIDODERM ) 5 % Place 1 patch onto the skin every 12 (twelve) hours. Remove & Discard patch within 12 hours or as directed by MD 10 patch 0   lubiprostone  (AMITIZA ) 24 MCG capsule Take 1 capsule (24 mcg total) by mouth 2 (two) times daily with a meal. 60 capsule 1   methocarbamol  (ROBAXIN ) 500 MG tablet Take 1 tablet (500 mg total) by mouth every 8 (eight) hours as needed for muscle spasms. 6 tablet 0   midodrine  (PROAMATINE ) 2.5 MG tablet TAKE 1 TABLET BY MOUTH TWICE A DAY 90 tablet 1   OLANZapine  (ZYPREXA ) 10 MG tablet TAKE 1 TABLET BY MOUTH AT BEDTIME 90 tablet 1   OVER THE COUNTER MEDICATION Rexall for constipation     OXcarbazepine  (TRILEPTAL ) 150 MG tablet TAKE 1 TABLET BY MOUTH TWICE A DAY 180 tablet 0   PARoxetine  (PAXIL ) 10 MG tablet Take 1.5 tablets (15 mg total) by mouth daily. 135 tablet 1   pravastatin  (PRAVACHOL ) 40 MG tablet TAKE 1 TABLET BY MOUTH DAILY 90 tablet 4   Psyllium (METAMUCIL) 0.36 g CAPS Take by mouth.     risperiDONE  (RISPERDAL ) 2 MG tablet TAKE 1 TABLET BY MOUTH AT BEDTIME 90 tablet 1   No current  facility-administered medications for this visit.     Musculoskeletal: Strength & Muscle Tone: within normal limits Gait & Station: normal Patient leans: N/A  Psychiatric Specialty Exam: Review of Systems  Neurological: Negative.     Blood pressure (!) 142/90, pulse 76, temperature 98.8 F (37.1 C), temperature source Temporal, height 6' (1.829 m), weight 236 lb 6.4 oz (107.2 kg), SpO2 99%.Body mass index is 32.06 kg/m.  General Appearance: Casual  Eye Contact:  Fair  Speech:  Normal Rate  Volume:  Normal  Mood:  Euthymic  Affect:  Congruent  Thought Process:  Goal Directed and Descriptions of Associations: Intact  Orientation:  Full (Time, Place, and Person)  Thought Content: Logical   Suicidal Thoughts:  No  Homicidal Thoughts:  No  Memory:  Immediate;   Fair Recent;   Fair Remote;   Poor  Judgement:  Fair  Insight:  Shallow  Psychomotor Activity:  Normal  Concentration:  Concentration: limited and Attention Span: limited  Recall:  Poor  Fund of Knowledge: Fair  Language: Fair  Akathisia:  No  Handed:  Right  AIMS (if indicated): done  Assets:  Manufacturing systems engineer Desire for Improvement Housing Social Support Transportation  ADL's:  Intact  Cognition: WNL  Sleep:  Fair   Screenings: Geneticist, molecular Office Visit from 05/24/2024 in [facility] Office Visit from 05/25/2023 in [facility] Office Visit from 11/30/2022 in [facility] Video Visit from 06/01/2022 in [facility] Office Visit from 02/16/2022 in [facility]  AIMS Total Score 0 0 0 0 0   GAD-7    Flowsheet Row Office Visit from 11/24/2023 in [facility] Office Visit from 05/25/2023 in [facility] Office Visit from  11/30/2022 in [facility] Video Visit  from 06/01/2022 in [facility] Office Visit from 04/21/2021 in [facility]  Total GAD-7 Score 0 1 0 2 1   PHQ2-9    Flowsheet Row Clinical Support from 05/14/2024 in [facility] Office Visit from 11/24/2023 in [facility] Office Visit from 06/03/2023 in [facility] Office Visit from 05/25/2023 in [facility] Clinical Support from 05/04/2023 in [facility]  PHQ-2 Total Score 0 0 0 0 0  PHQ-9 Total Score 2 -- 0 0 --   Flowsheet Row Office Visit from 05/24/2024 in [facility] ED from 05/15/2024 in [facility] Emergency Department at [facility] ED from 02/25/2024 in [facility] Emergency Department at [facility]  C-SSRS RISK CATEGORY No Risk No Risk No Risk     Assessment and Plan: CHANE COWDEN is a 74 year old Caucasian male, has a history of schizophrenia, GAD was evaluated in office today.  Discussed assessment and plan as noted below.  Schizophrenia-stable Currently well managed on current medication regimen.  Compliant on medications which includes to atypical antipsychotic medications including olanzapine  and risperidone .  Aware of long-term side effects of being on 2 antipsychotics however previous attempts to taper it off did not work. Continue Olanzapine  10 mg at bedtime Continue Risperidone  2 mg at bedtime Continue Trileptal  150 mg twice daily Consider tapering up on antipsychotic medication off in the future.  Previous attempts to taper off did not work. Will monitor for any adverse side effects.  Generalized anxiety disorder-stable Currently denies any significant anxiety symptoms. Continue Paxil  15 mg daily  High  risk medication use-noncompliant with labs including BUN/creatinine, hepatic function and sodium ordered on 11/24/2023.  Encouraged to complete it.  Also will benefit from TSH, prolactin level, hemoglobin A1c and lipid panel.  Patient is on 2 atypical antipsychotic medications and these labs needs to be monitored.  Provided lab slip.  Could also discuss with primary care provider.  Collateral information obtained from sister as noted above.  Follow-up Follow-up in clinic in 5 to 6 months or sooner if needed.  Collaboration of Care: Collaboration of Care: Primary Care Provider AEB encouraged to follow up with primary care provider.  Patient with elevated blood pressure reading in session advised to monitor and follow up with primary care.  Patient/Guardian was advised Release of Information must be obtained prior to any record release in order to collaborate their care with an outside provider. Patient/Guardian was advised if they have not already done so to contact the registration department to sign all necessary forms in order for us  to release information regarding their care.   Consent: Patient/Guardian gives verbal consent for treatment and assignment of benefits for services provided during this visit. Patient/Guardian expressed understanding and agreed to proceed.   This note was generated in part or whole with voice recognition software. Voice recognition is usually quite accurate but there are transcription errors that can and very often do occur. I apologize for any typographical errors that were not detected and corrected.    Naturi Alarid, MD 05/25/2024, 11:23 AM

## 2024-05-25 DIAGNOSIS — Z79899 Other long term (current) drug therapy: Secondary | ICD-10-CM | POA: Diagnosis not present

## 2024-05-26 LAB — BUN+CREAT
BUN/Creatinine Ratio: 10 (ref 10–24)
BUN: 11 mg/dL (ref 8–27)
Creatinine, Ser: 1.13 mg/dL (ref 0.76–1.27)
eGFR: 68 mL/min/{1.73_m2} (ref >59)

## 2024-05-26 LAB — SODIUM: Sodium: 134 mmol/L (ref 134–144)

## 2024-05-26 LAB — HEPATIC FUNCTION PANEL
ALT: 20 IU/L (ref 0–44)
AST: 22 IU/L (ref 0–40)
Albumin: 4 g/dL (ref 3.8–4.8)
Alkaline Phosphatase: 103 IU/L (ref 44–121)
Bilirubin Total: 0.4 mg/dL (ref 0.0–1.2)
Bilirubin, Direct: 0.19 mg/dL (ref 0.00–0.40)
Total Protein: 6.6 g/dL (ref 6.0–8.5)

## 2024-05-26 LAB — LIPID PANEL
Chol/HDL Ratio: 2.6 ratio (ref 0.0–5.0)
Cholesterol, Total: 136 mg/dL (ref 100–199)
HDL: 52 mg/dL
LDL Chol Calc (NIH): 69 mg/dL (ref 0–99)
Triglycerides: 79 mg/dL (ref 0–149)
VLDL Cholesterol Cal: 15 mg/dL (ref 5–40)

## 2024-05-26 LAB — TSH: TSH: 2.42 u[IU]/mL (ref 0.450–4.500)

## 2024-05-26 LAB — PROLACTIN: Prolactin: 17.4 ng/mL (ref 3.6–25.2)

## 2024-05-26 LAB — HEMOGLOBIN A1C
Est. average glucose Bld gHb Est-mCnc: 114 mg/dL
Hgb A1c MFr Bld: 5.6 % (ref 4.8–5.6)

## 2024-05-28 ENCOUNTER — Ambulatory Visit: Payer: Self-pay | Admitting: Psychiatry

## 2024-05-28 NOTE — Telephone Encounter (Signed)
 left message with results and directions

## 2024-05-28 NOTE — Telephone Encounter (Signed)
-----   Message from Todd Fossa, MD sent at 05/28/2024  1:39 PM EDT -----   ----- Message ----- From: Garner Jury Lab Results In Sent: 05/26/2024   5:37 AM EDT To: Saramma Eappen, MD

## 2024-05-28 NOTE — Telephone Encounter (Signed)
 Please notify the patient that the labs are within normal limits. I would recommend continuing the current medication regimen as previously recommended by Dr. Eappen.

## 2024-07-09 ENCOUNTER — Telehealth: Payer: Self-pay | Admitting: Family Medicine

## 2024-07-09 DIAGNOSIS — I951 Orthostatic hypotension: Secondary | ICD-10-CM

## 2024-07-10 NOTE — Telephone Encounter (Signed)
 Have resent refill for medication, but he is overdue for follow up appointment and needs to schedule office visit within the month

## 2024-08-23 ENCOUNTER — Emergency Department

## 2024-08-23 ENCOUNTER — Other Ambulatory Visit: Payer: Self-pay

## 2024-08-23 ENCOUNTER — Emergency Department
Admission: EM | Admit: 2024-08-23 | Discharge: 2024-08-23 | Disposition: A | Discharge status: Home / Self Care | Attending: Emergency Medicine | Admitting: Emergency Medicine

## 2024-08-23 DIAGNOSIS — W01198A Fall on same level from slipping, tripping and stumbling with subsequent striking against other object, initial encounter: Secondary | ICD-10-CM | POA: Insufficient documentation

## 2024-08-23 DIAGNOSIS — Z043 Encounter for examination and observation following other accident: Secondary | ICD-10-CM | POA: Diagnosis not present

## 2024-08-23 DIAGNOSIS — M50322 Other cervical disc degeneration at C5-C6 level: Secondary | ICD-10-CM | POA: Diagnosis not present

## 2024-08-23 DIAGNOSIS — S0990XA Unspecified injury of head, initial encounter: Secondary | ICD-10-CM | POA: Insufficient documentation

## 2024-08-23 DIAGNOSIS — R220 Localized swelling, mass and lump, head: Secondary | ICD-10-CM | POA: Diagnosis not present

## 2024-08-23 DIAGNOSIS — M545 Low back pain, unspecified: Secondary | ICD-10-CM | POA: Diagnosis not present

## 2024-08-23 DIAGNOSIS — M4802 Spinal stenosis, cervical region: Secondary | ICD-10-CM | POA: Diagnosis not present

## 2024-08-23 DIAGNOSIS — M5031 Other cervical disc degeneration,  high cervical region: Secondary | ICD-10-CM | POA: Diagnosis not present

## 2024-08-23 DIAGNOSIS — W19XXXA Unspecified fall, initial encounter: Secondary | ICD-10-CM

## 2024-08-23 DIAGNOSIS — R001 Bradycardia, unspecified: Secondary | ICD-10-CM | POA: Diagnosis not present

## 2024-08-23 LAB — CBC WITH DIFFERENTIAL/PLATELET
Abs Immature Granulocytes: 0.03 K/uL (ref 0.00–0.07)
Basophils Absolute: 0 K/uL (ref 0.0–0.1)
Basophils Relative: 1 %
Eosinophils Absolute: 0.2 K/uL (ref 0.0–0.5)
Eosinophils Relative: 3 %
HCT: 35.6 % — ABNORMAL LOW (ref 39.0–52.0)
Hemoglobin: 12.2 g/dL — ABNORMAL LOW (ref 13.0–17.0)
Immature Granulocytes: 1 %
Lymphocytes Relative: 20 %
Lymphs Abs: 1.3 K/uL (ref 0.7–4.0)
MCH: 29.5 pg (ref 26.0–34.0)
MCHC: 34.3 g/dL (ref 30.0–36.0)
MCV: 86.2 fL (ref 80.0–100.0)
Monocytes Absolute: 0.7 K/uL (ref 0.1–1.0)
Monocytes Relative: 12 %
Neutro Abs: 3.9 K/uL (ref 1.7–7.7)
Neutrophils Relative %: 63 %
Platelets: 259 K/uL (ref 150–400)
RBC: 4.13 MIL/uL — ABNORMAL LOW (ref 4.22–5.81)
RDW: 13.3 % (ref 11.5–15.5)
WBC: 6.2 K/uL (ref 4.0–10.5)
nRBC: 0 % (ref 0.0–0.2)

## 2024-08-23 LAB — URINALYSIS, W/ REFLEX TO CULTURE (INFECTION SUSPECTED)
Bacteria, UA: NONE SEEN
Bilirubin Urine: NEGATIVE
Glucose, UA: NEGATIVE mg/dL
Hgb urine dipstick: NEGATIVE
Ketones, ur: NEGATIVE mg/dL
Leukocytes,Ua: NEGATIVE
Nitrite: NEGATIVE
Protein, ur: NEGATIVE mg/dL
Specific Gravity, Urine: 1.004 — ABNORMAL LOW (ref 1.005–1.030)
WBC, UA: 0 WBC/hpf (ref 0–5)
pH: 8 (ref 5.0–8.0)

## 2024-08-23 LAB — COMPREHENSIVE METABOLIC PANEL WITH GFR
ALT: 19 U/L (ref 0–44)
AST: 27 U/L (ref 15–41)
Albumin: 3.6 g/dL (ref 3.5–5.0)
Alkaline Phosphatase: 71 U/L (ref 38–126)
Anion gap: 12 (ref 5–15)
BUN: 9 mg/dL (ref 8–23)
CO2: 27 mmol/L (ref 22–32)
Calcium: 9 mg/dL (ref 8.9–10.3)
Chloride: 97 mmol/L — ABNORMAL LOW (ref 98–111)
Creatinine, Ser: 1.17 mg/dL (ref 0.61–1.24)
GFR, Estimated: >60 mL/min (ref >60)
Glucose, Bld: 107 mg/dL — ABNORMAL HIGH (ref 70–99)
Potassium: 4 mmol/L (ref 3.5–5.1)
Sodium: 136 mmol/L (ref 135–145)
Total Bilirubin: 0.8 mg/dL (ref 0.0–1.2)
Total Protein: 6.4 g/dL — ABNORMAL LOW (ref 6.5–8.1)

## 2024-08-23 LAB — TROPONIN I (HIGH SENSITIVITY)
Troponin I (High Sensitivity): 9 ng/L (ref <18)
Troponin I (High Sensitivity): 8 ng/L (ref <18)

## 2024-08-23 NOTE — Discharge Instructions (Signed)
 Please follow-up with primary care for further evaluation.  Return here for new or worse symptoms.

## 2024-08-23 NOTE — ED Triage Notes (Signed)
 Pt to ED via POV for c/o for falling twice this week. Pt states his legs gave way and he collapsed. States he hit his head last night. Pt not on thinners, denies LOC, denies pain. Pt A&O, ambulatory to triage, NAD.

## 2024-08-23 NOTE — ED Provider Notes (Signed)
 [place] EMERGENCY DEPARTMENT AT [facility] Provider Note   CSN: [id number] Arrival date & time: 08/23/24  9291     Patient presents with: [name] is a 73 y.o. male.   Patient here for fall.  Reporting a fall last night without any LOC.  Felt generally weak and fatigued fell back and hit head.  No LOC.  No chest pain or shortness of breath at any point.  Also with some low back pain.  No numbness or weakness.  Notes that he has fallen twice in the past 1 week.  Over the past month or more he has been losing weight although he has been exercising more.   Fall Pertinent negatives include no chest pain, no abdominal pain, no headaches and no shortness of breath.       Prior to Admission medications   Medication Sig Start Date End Date Taking? Authorizing Provider  donepezil (ARICEPT) 5 MG tablet Take by mouth. 11/21/20 05/24/24  [provider]  fludrocortisone  (FLORINEF ) 0.1 MG tablet TAKE 1 TABLET BY MOUTH TWICE A DAY 07/10/24   Gasper Nancyann BRAVO, MD  lidocaine  (LIDODERM ) 5 % Place 1 patch onto the skin every 12 (twelve) hours. Remove & Discard patch within 12 hours or as directed by MD 02/25/24 02/24/25  Saunders Givens L, PA-C  lubiprostone  (AMITIZA ) 24 MCG capsule Take 1 capsule (24 mcg total) by mouth 2 (two) times daily with a meal. 06/03/23   Gasper Nancyann BRAVO, MD  methocarbamol  (ROBAXIN ) 500 MG tablet Take 1 tablet (500 mg total) by mouth every 8 (eight) hours as needed for muscle spasms. 02/25/24   Saunders Givens LITTIE, PA-C  midodrine  (PROAMATINE ) 2.5 MG tablet TAKE 1 TABLET BY MOUTH TWICE A DAY 07/10/24   Gasper Nancyann BRAVO, MD  OLANZapine  (ZYPREXA ) 10 MG tablet TAKE 1 TABLET BY MOUTH AT BEDTIME 03/26/24   Eappen, Saramma, MD  OVER THE COUNTER MEDICATION Rexall for constipation    [provider]  OXcarbazepine  (TRILEPTAL ) 150 MG tablet TAKE 1 TABLET BY MOUTH TWICE A DAY 05/21/24   Eappen, Saramma, MD  PARoxetine  (PAXIL ) 10 MG tablet Take 1.5  tablets (15 mg total) by mouth daily. 02/13/24   Eappen, Saramma, MD  pravastatin  (PRAVACHOL ) 40 MG tablet TAKE 1 TABLET BY MOUTH DAILY 10/17/23   Gasper Nancyann BRAVO, MD  Psyllium (METAMUCIL) 0.36 g CAPS Take by mouth.    [provider]  risperiDONE  (RISPERDAL ) 2 MG tablet TAKE 1 TABLET BY MOUTH AT BEDTIME 03/26/24   Eappen, Saramma, MD    Allergies: Vitamin b12    Review of Systems  Constitutional:  Positive for fatigue. Negative for chills and fever.  HENT:  Negative for congestion.   Respiratory:  Negative for cough and shortness of breath.   Cardiovascular:  Negative for chest pain.  Gastrointestinal:  Negative for abdominal pain, diarrhea, nausea and vomiting.  Genitourinary:  Negative for dysuria.  Musculoskeletal:  Positive for back pain.  Skin:  Negative for wound.  Neurological:  Negative for weakness, numbness and headaches.    Updated Vital Signs BP (!) 140/80   Pulse 62   Temp 98 F (36.7 C)   Resp 16   Ht 6' (1.829 m)   Wt 103 kg   SpO2 100%   BMI 30.79 kg/m   Physical Exam Vitals reviewed.  Constitutional:      Appearance: Normal appearance.  HENT:     Head: Normocephalic and atraumatic.     Comments: No  hematomas no laceration    Nose: Nose normal.  Neck:     Comments: No midline cervical tenderness.  Full range of motion. Cardiovascular:     Pulses: Normal pulses.  Pulmonary:     Effort: Pulmonary effort is normal.  Abdominal:     General: Abdomen is flat. There is no distension.     Tenderness: There is no abdominal tenderness. There is no guarding.  Musculoskeletal:        General: Normal range of motion.     Cervical back: Normal range of motion. No tenderness.     Comments: Freely moving all extremities with no bony tenderness.  Skin:    General: Skin is warm.  Neurological:     Mental Status: He is alert and oriented to person, place, and time. Mental status is at baseline.     Sensory: No sensory deficit.     Motor: No weakness.   Psychiatric:        Mood and Affect: Mood normal.        Behavior: Behavior normal.     (all labs ordered are listed, but only abnormal results are displayed) Labs Reviewed  COMPREHENSIVE METABOLIC PANEL WITH GFR - Abnormal; Notable for the following components:      Result Value   Chloride 97 (*)    Glucose, Bld 107 (*)    Total Protein 6.4 (*)    All other components within normal limits  CBC WITH DIFFERENTIAL/PLATELET - Abnormal; Notable for the following components:   RBC 4.13 (*)    Hemoglobin 12.2 (*)    HCT 35.6 (*)    All other components within normal limits  URINALYSIS, W/ REFLEX TO CULTURE (INFECTION SUSPECTED) - Abnormal; Notable for the following components:   Color, Urine STRAW (*)    APPearance CLEAR (*)    Specific Gravity, Urine 1.004 (*)    All other components within normal limits  TROPONIN I (HIGH SENSITIVITY)  TROPONIN I (HIGH SENSITIVITY)    EKG: EKG Interpretation Date/Time:  Thursday August 23 2024 07:43:11 EDT Ventricular Rate:  59 PR Interval:  138 QRS Duration:  90 QT Interval:  438 QTC Calculation: 433 R Axis:   36  Text Interpretation: Sinus bradycardia with Premature atrial complexes Otherwise normal ECG When compared with ECG of 07-Nov-2020 10:09, Premature atrial complexes are now Present Confirmed by UNCONFIRMED, DOCTOR ([id number]), editor Alex Slough [phone]) on 08/23/2024 9:06:16 AM  Radiology: CT Cervical Spine Wo Contrast Result Date: 08/23/2024 CLINICAL DATA:  73 year old male status post fall striking head last night. EXAM: CT CERVICAL SPINE WITHOUT CONTRAST TECHNIQUE: Multidetector CT imaging of the cervical spine was performed without intravenous contrast. Multiplanar CT image reconstructions were also generated. RADIATION DOSE REDUCTION: This exam was performed according to the departmental dose-optimization program which includes automated exposure control, adjustment of the mA and/or kV according to patient size and/or use of  iterative reconstruction technique. COMPARISON:  Head CT today.  Cervical spine radiographs 05/06/2021. FINDINGS: Alignment: Improved cervical lordosis. Cervicothoracic junction alignment is within normal limits. Bilateral posterior element alignment is within normal limits. Skull base and vertebrae: Normal for age bone mineralization. Visualized skull base is intact. No atlanto-occipital dissociation. C1 and C2 appear intact and aligned. No acute osseous abnormality identified. Soft tissues and spinal canal: No prevertebral fluid or swelling. No visible canal hematoma. Calcified cervical carotid atherosclerosis bilaterally. Disc levels: Degenerative facet ankylosis at C2-C3. Lower cervical disc and endplate degeneration at C5-C6 and C6-C7. Soft disc bulging suspected at C3-C4 with  ligament flavum hypertrophy there. Mild spinal stenosis suspected at that level. Upper chest: Visible upper thoracic levels appear intact. Negative visible noncontrast thoracic inlet. IMPRESSION: 1. No acute traumatic injury identified in the cervical spine. 2. Mild multifactorial cervical spinal stenosis suspected at C3-C4, adjacent to degenerative facet ankylosis at C2-C3. Electronically Signed   By: VEAR Hurst M.D.   On: 08/23/2024 09:16   CT Head Wo Contrast Result Date: 08/23/2024 CLINICAL DATA:  73 year old male status post fall striking head last night. EXAM: CT HEAD WITHOUT CONTRAST TECHNIQUE: Contiguous axial images were obtained from the base of the skull through the vertex without intravenous contrast. RADIATION DOSE REDUCTION: This exam was performed according to the departmental dose-optimization program which includes automated exposure control, adjustment of the mA and/or kV according to patient size and/or use of iterative reconstruction technique. COMPARISON:  Brain MRI 12/04/2020.  Head CT 06/15/2018. FINDINGS: Brain: Cerebral volume not significantly changed since 2019. No midline shift, ventriculomegaly, mass effect,  evidence of mass lesion, intracranial hemorrhage or evidence of cortically based acute infarction. Vascular: Calcified atherosclerosis at the skull base. No suspicious intracranial vascular hyperdensity. Skull: Stable and intact. Sinuses/Orbits: Visualized paranasal sinuses and mastoids are stable and well aerated. Other: Left posterosuperior convexity mild new soft tissue swelling and stranding on series 3, image 63. Underlying calvarium intact. No soft tissue gas. Postoperative changes to both globes, otherwise negative orbits soft tissues. IMPRESSION: 1. Mild left posterior scalp soft tissue injury without skull fracture. 2. Stable and negative for age noncontrast CT appearance of the brain. Electronically Signed   By: VEAR Hurst M.D.   On: 08/23/2024 09:13   DG Chest 2 View Result Date: 08/23/2024 CLINICAL DATA:  fall, lbp. EXAM: CHEST - 2 VIEW COMPARISON:  03/18/2016. FINDINGS: Bilateral lung fields are clear. Bilateral costophrenic angles are clear. Normal cardio-mediastinal silhouette. No acute osseous abnormalities. The soft tissues are within normal limits. IMPRESSION: No active cardiopulmonary disease. Electronically Signed   By: Ree Molt M.D.   On: 08/23/2024 08:40   DG Lumbar Spine Complete Result Date: 08/23/2024 CLINICAL DATA:  fall, lbp. EXAM: LUMBAR SPINE - COMPLETE 4+ VIEW COMPARISON:  None Available. FINDINGS: There are 5 nonrib-bearing lumbar vertebrae. Anatomic lumbar curvature. No spondylolysis or spondylolisthesis. Vertebral body heights are maintained. No aggressive osseous lesion. Mild multilevel degenerative changes in the form of facet arthropathy and marginal osteophyte formation. Intervertebral disc heights are maintained. Sacroiliac joints are symmetric. Visualized soft tissues are within normal limits. IMPRESSION: *No acute osseous abnormality of the lumbar spine. Electronically Signed   By: Ree Molt M.D.   On: 08/23/2024 08:40     Procedures   Medications Ordered  in the ED - No data to display                                  Medical Decision Making Patient here for fall.  No syncope but has had 2 falls over the past 1 week also weight loss over the past month although he has been exercising more than normal.  He is hemodynamically stable afebrile nonhypoxic overall well-appearing on my exam.  No obvious evidence of trauma.  No chest or abdominal pain or tenderness on exam.  No extremity pain or injuries.  Will evaluate with broad differential including ICH, cervical fracture as well as because of fall being infection, metabolic derangement, ACS although thought to be less likely   Troponins rule out for acute  coronary syndrome.  Labs are overall unremarkable.  No evidence of urinary tract infection.  Nontraumatic scans of the head and neck.  Chest and lumbar overall unremarkable.  Will recommend follow-up with primary care for further evaluation and treatment.  Given return precautions for new or worse or different symptoms.   Amount and/or Complexity of Data Reviewed Labs: ordered. Radiology: ordered.        Final diagnoses:  Fall, initial encounter  Injury of head, initial encounter    ED Discharge Orders     None          [place]ston Mallick, [place] 08/23/24 1215    Ernest Ronal BRAVO, MD 08/26/24 1315

## 2024-08-23 NOTE — ED Notes (Addendum)
 See triage note   Presents with pain to lower back,pelvic area and head.  States he has fallen couple of times this week.States his legs feel like they give out

## 2024-08-23 NOTE — ED Notes (Signed)
 ED Provider at bedside.

## 2024-08-27 ENCOUNTER — Other Ambulatory Visit: Payer: Self-pay | Admitting: Family Medicine

## 2024-08-27 DIAGNOSIS — I951 Orthostatic hypotension: Secondary | ICD-10-CM

## 2024-08-31 ENCOUNTER — Ambulatory Visit (INDEPENDENT_AMBULATORY_CARE_PROVIDER_SITE_OTHER): Admitting: Family Medicine

## 2024-08-31 ENCOUNTER — Encounter: Payer: Self-pay | Admitting: Family Medicine

## 2024-08-31 VITALS — BP 165/78 | HR 59 | Ht 72.0 in | Wt 231.3 lb

## 2024-08-31 DIAGNOSIS — W19XXXA Unspecified fall, initial encounter: Secondary | ICD-10-CM

## 2024-08-31 DIAGNOSIS — S0003XA Contusion of scalp, initial encounter: Secondary | ICD-10-CM | POA: Diagnosis not present

## 2024-08-31 DIAGNOSIS — Z23 Encounter for immunization: Secondary | ICD-10-CM | POA: Diagnosis not present

## 2024-08-31 NOTE — Patient Instructions (Signed)
 SABRA  Please review the attached list of medications and notify my office if there are any errors.   . Please bring all of your medications to every appointment so we can make sure that our medication list is the same as yours.

## 2024-08-31 NOTE — Addendum Note (Signed)
 Addended by: LILIAN SEVERO RAMAN on: 08/31/2024 05:08 PM   Modules accepted: Orders

## 2024-08-31 NOTE — Progress Notes (Signed)
 Established patient visit   Patient: [name]   DOB: 1951-06-16   73 y.o. Male  MRN: [id number] Visit Date: 08/31/2024  Today's healthcare provider: Nancyann Perry, MD   Chief Complaint  Patient presents with   Follow-up    Patient seen at [facility] on 08/23/24 due to 2 falls in 1 week. Patient reports feeling better but still a little off balance   Fall    Reports he had recently started a diet before falls and believes maybe his sugar was too low. Sister reports he has since then stopped diet and seems his balance is a little beter. No hx of diabetes that they are aware of. She reports she checked his BP the day of the fall and it was a little elevated as well.  Glucose 107 from ER labs and BP 140/80   Subjective    Discussed the use of AI scribe software for clinical note transcription with the patient, who gave verbal consent to proceed.  History of Present Illness   [name] is a 73 year old male who presents for follow-up after an ER visit for a fall.  He was evaluated in the ER on September 14th after a fall and was found to have sinus bradycardia and premature atrial complexes. Diagnostic tests, including troponin I levels, CBC, complete metabolic panel, and urinalysis, were normal. Imaging studies, such as a chest x-ray, CT of the cervical spine, and CT of the head, showed no acute changes except for a mild left posterior scalp soft tissue injury without fracture.  He experienced two falls in one week, coinciding with a period of reduced food intake as he attempted to diet. He felt weakness and dizziness, particularly when standing. His family member notes that he typically consumes a lot of candy, and the reduction in sugar intake may have contributed to his symptoms. He has since increased his food intake and reports no current dizziness.  He has experienced significant weight loss, dropping from approximately 240 pounds to 231 pounds in a short period. His  family member attributes this rapid weight loss to his dietary changes.  He is currently managing his medications, although there are instances where he runs out of certain medications before others. His family member assists in managing his medication refills.     Wt Readings from Last 3 Encounters:  08/31/24 231 lb 4.8 oz (104.9 kg)  08/23/24 227 lb (103 kg)  05/15/24 241 lb (109.3 kg)     Medications: Outpatient Medications Prior to Visit  Medication Sig   donepezil (ARICEPT) 5 MG tablet Take by mouth.   fludrocortisone  (FLORINEF ) 0.1 MG tablet TAKE 1 TABLET BY MOUTH TWICE A DAY   midodrine  (PROAMATINE ) 2.5 MG tablet TAKE 1 TABLET BY MOUTH TWICE A DAY   OLANZapine  (ZYPREXA ) 10 MG tablet TAKE 1 TABLET BY MOUTH AT BEDTIME   OVER THE COUNTER MEDICATION Rexall for constipation   OXcarbazepine  (TRILEPTAL ) 150 MG tablet TAKE 1 TABLET BY MOUTH TWICE A DAY   PARoxetine  (PAXIL ) 10 MG tablet Take 1.5 tablets (15 mg total) by mouth daily.   pravastatin  (PRAVACHOL ) 40 MG tablet TAKE 1 TABLET BY MOUTH DAILY   risperiDONE  (RISPERDAL ) 2 MG tablet TAKE 1 TABLET BY MOUTH AT BEDTIME   lidocaine  (LIDODERM ) 5 % Place 1 patch onto the skin every 12 (twelve) hours. Remove & Discard patch within 12 hours or as directed by MD (Patient not taking: Reported on 08/31/2024)   lubiprostone  (AMITIZA )  24 MCG capsule Take 1 capsule (24 mcg total) by mouth 2 (two) times daily with a meal. (Patient not taking: Reported on 08/31/2024)   methocarbamol  (ROBAXIN ) 500 MG tablet Take 1 tablet (500 mg total) by mouth every 8 (eight) hours as needed for muscle spasms. (Patient not taking: Reported on 08/31/2024)   Psyllium (METAMUCIL) 0.36 g CAPS Take by mouth. (Patient not taking: Reported on 08/31/2024)   No facility-administered medications prior to visit.   Review of Systems  Constitutional:  Negative for appetite change, chills and fever.  Respiratory:  Negative for chest tightness, shortness of breath and wheezing.    Cardiovascular:  Negative for chest pain and palpitations.  Gastrointestinal:  Negative for abdominal pain, nausea and vomiting.       Objective    BP (!) 165/78 (BP Location: Left Arm, Patient Position: Sitting, Cuff Size: Normal)   Pulse (!) 59   Ht 6' (1.829 m)   Wt 231 lb 4.8 oz (104.9 kg)   SpO2 100%   BMI 31.37 kg/m   Physical Exam   General: Appearance:    Mildly obese male in no acute distress  Eyes:    PERRL, conjunctiva/corneas clear, EOM's intact       Lungs:     Clear to auscultation bilaterally, respirations unlabored  Heart:    Bradycardic. Normal rhythm. No murmurs, rubs, or gallops.    MS:   All extremities are intact.    Neurologic:   Awake, alert, oriented x 3. No apparent focal neurological defect.         Assessment & Plan        Fall with mild left posterior scalp soft tissue injury Mild left posterior scalp soft tissue injury without fracture. No acute changes on CT of the head and cervical spine.  Dizziness and weakness, resolved Dizziness and weakness likely due to hypoglycemia from dietary changes. - Advise dietary modifications to include complex carbohydrates and proteins to maintain stable blood sugar levels. - Instruct to report any recurrence of dizziness or falls.   Rapid weight loss due to self-imposed dietary changes. Weight decreased from approximately 240 lbs to 231 lbs. Discussed the risks of rapid weight loss and the importance of a balanced diet. - Advise on gradual weight loss and balanced diet.    Return in about 6 months (around 02/28/2025).     Nancyann Perry, MD  [facility] [phone] (phone) [phone] (fax)  [facility]

## 2024-09-03 ENCOUNTER — Other Ambulatory Visit: Payer: Self-pay | Admitting: Psychiatry

## 2024-09-03 DIAGNOSIS — F209 Schizophrenia, unspecified: Secondary | ICD-10-CM

## 2024-09-03 DIAGNOSIS — F411 Generalized anxiety disorder: Secondary | ICD-10-CM

## 2024-10-01 ENCOUNTER — Other Ambulatory Visit: Payer: Self-pay | Admitting: Psychiatry

## 2024-10-01 DIAGNOSIS — F209 Schizophrenia, unspecified: Secondary | ICD-10-CM

## 2024-10-01 DIAGNOSIS — F411 Generalized anxiety disorder: Secondary | ICD-10-CM

## 2024-10-15 ENCOUNTER — Other Ambulatory Visit: Payer: Self-pay | Admitting: Family Medicine

## 2024-10-15 DIAGNOSIS — I951 Orthostatic hypotension: Secondary | ICD-10-CM

## 2024-10-22 ENCOUNTER — Other Ambulatory Visit: Payer: Self-pay | Admitting: Family Medicine

## 2024-11-19 ENCOUNTER — Telehealth: Payer: Self-pay | Admitting: Family Medicine

## 2024-11-19 ENCOUNTER — Telehealth: Payer: Self-pay | Admitting: Psychiatry

## 2024-11-19 DIAGNOSIS — F411 Generalized anxiety disorder: Secondary | ICD-10-CM

## 2024-11-19 DIAGNOSIS — I951 Orthostatic hypotension: Secondary | ICD-10-CM

## 2024-11-19 NOTE — Progress Notes (Signed)
 Attempted to contact patient at the time of appointment.  Sister Ms. Ronal Lager picked up the phone and said that they were trying to connect.  Patient did not connect.  Notified staff.

## 2024-11-19 NOTE — Telephone Encounter (Signed)
 Prescription refill request has been sent into [facility] for  Fludrocortisone  0.1 mg Midodrine  hcl 2.5 mg  Pravastin sodium 40mg  Please send in all medications to [facility] per Jayvan's request.

## 2024-11-20 NOTE — Telephone Encounter (Signed)
 He needs to schedule appointment in January for follow up. Ok to sent prescriptions if he makes appointment.

## 2024-11-20 NOTE — Addendum Note (Signed)
 Addended by: GASPER NANCYANN BRAVO on: 11/20/2024 10:55 AM   Modules accepted: Orders

## 2024-11-21 NOTE — Telephone Encounter (Signed)
 Called patient, person that answered phone said she was busy using the phone at this moment that Rutha will call later/or of we can call later.

## 2024-11-22 ENCOUNTER — Telehealth: Payer: Self-pay

## 2024-11-22 NOTE — Telephone Encounter (Signed)
 Noted

## 2024-11-22 NOTE — Telephone Encounter (Signed)
 Spoke with Patient who stated [facility] He only wants to use [facility] - [place], [place] - [address]   [facility].

## 2024-11-24 ENCOUNTER — Emergency Department

## 2024-11-24 ENCOUNTER — Other Ambulatory Visit: Payer: Self-pay

## 2024-11-24 ENCOUNTER — Emergency Department
Admission: EM | Admit: 2024-11-24 | Discharge: 2024-11-24 | Disposition: A | Discharge status: Home / Self Care | Attending: Emergency Medicine | Admitting: Emergency Medicine

## 2024-11-24 DIAGNOSIS — Y92009 Unspecified place in unspecified non-institutional (private) residence as the place of occurrence of the external cause: Secondary | ICD-10-CM | POA: Insufficient documentation

## 2024-11-24 DIAGNOSIS — W010XXA Fall on same level from slipping, tripping and stumbling without subsequent striking against object, initial encounter: Secondary | ICD-10-CM | POA: Insufficient documentation

## 2024-11-24 DIAGNOSIS — T07XXXA Unspecified multiple injuries, initial encounter: Secondary | ICD-10-CM

## 2024-11-24 DIAGNOSIS — W19XXXA Unspecified fall, initial encounter: Secondary | ICD-10-CM

## 2024-11-24 DIAGNOSIS — S300XXA Contusion of lower back and pelvis, initial encounter: Secondary | ICD-10-CM | POA: Insufficient documentation

## 2024-11-24 DIAGNOSIS — I129 Hypertensive chronic kidney disease with stage 1 through stage 4 chronic kidney disease, or unspecified chronic kidney disease: Secondary | ICD-10-CM | POA: Insufficient documentation

## 2024-11-24 DIAGNOSIS — N189 Chronic kidney disease, unspecified: Secondary | ICD-10-CM | POA: Insufficient documentation

## 2024-11-24 NOTE — Discharge Instructions (Addendum)
 Follow-up with your primary care provider if any continued problems or concerns.  You may continue with your regular medication and take Tylenol  or ibuprofen as needed for discomfort.  It may take approximately 1 -2 weeks  before the bruises have completely subsided.

## 2024-11-24 NOTE — ED Provider Notes (Signed)
 [facility] Provider Note    Event Date/Time   First MD Initiated Contact with Patient 11/24/24 [phone]     (approximate)   History   Fall   HPI  [name] is a 72 y.o. male   presents to the ED by his sister to be checked out after a fall that occurred 3 days ago when patient slipped on ice and hit some wooden steps while taking his dog out for a walk.  This fall was unwitnessed.  Sister states that when she went to check on him he was communicating per his normal.  This morning she noticed a large bruise to his back.  She has given him a hydrocodone  this morning for pain.  She is unaware of any hematuria and patient has continued to ambulate without any assistance.  Patient has a history of schizophrenia, CKD, hypertension, prolonged QT intervals, generalized anxiety, hyponatremia, elevated blood pressure readings.      Physical Exam   Triage Vital Signs: ED Triage Vitals  Encounter Vitals Group     BP 11/24/24 0851 (!) 145/84     Girls Systolic BP Percentile --      Girls Diastolic BP Percentile --      Boys Systolic BP Percentile --      Boys Diastolic BP Percentile --      Pulse Rate 11/24/24 0851 70     Resp 11/24/24 0851 20     Temp 11/24/24 0851 98 F (36.7 C)     Temp Source 11/24/24 0851 Oral     SpO2 11/24/24 0851 98 %     Weight 11/24/24 0850 245 lb (111.1 kg)     Height 11/24/24 0850 6' (1.829 m)     Head Circumference --      Peak Flow --      Pain Score 11/24/24 0849 5     Pain Loc --      Pain Education --      Exclude from Growth Chart --     Most recent vital signs: Vitals:   11/24/24 0851  BP: (!) 145/84  Pulse: 70  Resp: 20  Temp: 98 F (36.7 C)  SpO2: 98%     General: Awake, no distress.  Alert, pleasant, talkative. CV:  Good peripheral perfusion.  Resp:  Normal effort.  Abd:  No distention.  Other:  On examination of the lumbar spine and sacrum there are bilateral ecchymotic areas in various stages of  healing.  Patient has moderate soft tissue tenderness.  Skin is intact.  Mild tenderness noted on palpation of the lumbar spine midline.  Patient is able to move lower extremities with good muscle strength.  Patient is able to stand and ambulate without any assistance.   ED Results / Procedures / Treatments   Labs (all labs ordered are listed, but only abnormal results are displayed) Labs Reviewed - No data to display   RADIOLOGY CT head per radiology is negative for any acute intracranial abnormalities.  Lumbar spine x-ray images were reviewed and interpreted by myself independent of the radiologist and was negative for fracture or subluxation.  Radiology report is negative for bony abnormalities. Left shoulder x-ray images were reviewed and interpreted by myself independent of the radiologist and no fracture or dislocation was noted.  Official radiology report agrees.    PROCEDURES:  Critical Care performed:   Procedures   MEDICATIONS ORDERED IN ED: Medications - No data to display   IMPRESSION /  MDM / ASSESSMENT AND PLAN / ED COURSE  I reviewed the triage vital signs and the nursing notes.   Differential diagnosis includes, but is not limited to, head injury with unwitnessed fall was considered but unlikely due to no neurological changes in the last 3 days, left shoulder strain, dislocation, fracture, lumbar fracture, subluxation, contusion, lumbosacral strain secondary to fall.  73 year old male presents to the ED after an unwitnessed fall that occurred 3 days ago.  Patient is not on any anticoagulants and sister who is also here states that she did not actually see him fall but heard him outside after he slipped on some ice.  CT and x-ray imaging were reassuring and patient and sister were made aware.  Patient is to continue with his regular medication and follow-up with his PCP if any continued problems.  Sister is aware that she should return to the emergency department if  any severe worsening of his symptoms or urgent concerns.      Patient's presentation is most consistent with acute illness / injury with system symptoms.  FINAL CLINICAL IMPRESSION(S) / ED DIAGNOSES   Final diagnoses:  Multiple contusions  Fall in home, initial encounter     Rx / DC Orders   ED Discharge Orders     None        Note:  This document was prepared using Dragon voice recognition software and may include unintentional dictation errors.   Saunders Shona CROME, PA-C 11/24/24 1144    Arlander Charleston, MD 11/24/24 [phone]

## 2024-11-24 NOTE — ED Triage Notes (Signed)
 Pt to ED with wife who both slipped and fell on ice 3 days ago. Pt is ambulatory with steady gait. Pt is HOH.

## 2024-12-20 ENCOUNTER — Ambulatory Visit: Admitting: Psychiatry

## 2025-02-05 ENCOUNTER — Ambulatory Visit: Admitting: Psychiatry

## 2025-05-22 ENCOUNTER — Encounter
